# Patient Record
Sex: Male | Born: 1940 | Race: White | Hispanic: No | Marital: Married | Smoking: Never smoker
Health system: Southern US, Community
[De-identification: ages and names within clinical notes are randomized; demographics above are authoritative.]

## PROBLEM LIST (undated history)

## (undated) DIAGNOSIS — E079 Disorder of thyroid, unspecified: Secondary | ICD-10-CM

## (undated) DIAGNOSIS — N289 Disorder of kidney and ureter, unspecified: Secondary | ICD-10-CM

## (undated) DIAGNOSIS — E785 Hyperlipidemia, unspecified: Secondary | ICD-10-CM

## (undated) DIAGNOSIS — N400 Enlarged prostate without lower urinary tract symptoms: Secondary | ICD-10-CM

## (undated) DIAGNOSIS — M109 Gout, unspecified: Secondary | ICD-10-CM

## (undated) HISTORY — PX: CATARACT EXTRACTION: SUR2

---

## 2002-05-22 ENCOUNTER — Encounter: Admission: RE | Admit: 2002-05-22 | Discharge: 2002-05-22 | Payer: Self-pay | Admitting: Internal Medicine

## 2002-05-22 ENCOUNTER — Encounter: Payer: Self-pay | Admitting: Internal Medicine

## 2002-06-26 ENCOUNTER — Ambulatory Visit (HOSPITAL_COMMUNITY): Admission: RE | Admit: 2002-06-26 | Discharge: 2002-06-26 | Payer: Self-pay | Admitting: Gastroenterology

## 2011-05-13 DIAGNOSIS — E782 Mixed hyperlipidemia: Secondary | ICD-10-CM | POA: Diagnosis not present

## 2011-05-13 DIAGNOSIS — E039 Hypothyroidism, unspecified: Secondary | ICD-10-CM | POA: Diagnosis not present

## 2011-05-13 DIAGNOSIS — Z0000 Encounter for general adult medical examination without abnormal findings: Secondary | ICD-10-CM | POA: Diagnosis not present

## 2011-05-13 DIAGNOSIS — G4730 Sleep apnea, unspecified: Secondary | ICD-10-CM | POA: Diagnosis not present

## 2011-05-19 DIAGNOSIS — G4730 Sleep apnea, unspecified: Secondary | ICD-10-CM | POA: Diagnosis not present

## 2011-05-23 DIAGNOSIS — H2510 Age-related nuclear cataract, unspecified eye: Secondary | ICD-10-CM | POA: Diagnosis not present

## 2011-08-25 DIAGNOSIS — M79609 Pain in unspecified limb: Secondary | ICD-10-CM | POA: Diagnosis not present

## 2011-10-05 DIAGNOSIS — J330 Polyp of nasal cavity: Secondary | ICD-10-CM | POA: Diagnosis not present

## 2011-10-05 DIAGNOSIS — R1310 Dysphagia, unspecified: Secondary | ICD-10-CM | POA: Diagnosis not present

## 2011-10-17 ENCOUNTER — Other Ambulatory Visit: Payer: Self-pay | Admitting: Internal Medicine

## 2011-10-17 DIAGNOSIS — R1310 Dysphagia, unspecified: Secondary | ICD-10-CM | POA: Diagnosis not present

## 2011-10-17 DIAGNOSIS — R05 Cough: Secondary | ICD-10-CM | POA: Diagnosis not present

## 2011-10-18 ENCOUNTER — Ambulatory Visit
Admission: RE | Admit: 2011-10-18 | Discharge: 2011-10-18 | Disposition: A | Discharge status: Home / Self Care | Payer: Medicare Other | Source: Ambulatory Visit | Attending: Internal Medicine | Admitting: Internal Medicine

## 2011-10-18 DIAGNOSIS — R1310 Dysphagia, unspecified: Secondary | ICD-10-CM

## 2011-10-18 DIAGNOSIS — K219 Gastro-esophageal reflux disease without esophagitis: Secondary | ICD-10-CM | POA: Diagnosis not present

## 2011-11-18 DIAGNOSIS — E663 Overweight: Secondary | ICD-10-CM | POA: Diagnosis not present

## 2011-11-18 DIAGNOSIS — E039 Hypothyroidism, unspecified: Secondary | ICD-10-CM | POA: Diagnosis not present

## 2011-11-18 DIAGNOSIS — E782 Mixed hyperlipidemia: Secondary | ICD-10-CM | POA: Diagnosis not present

## 2011-11-18 DIAGNOSIS — G4730 Sleep apnea, unspecified: Secondary | ICD-10-CM | POA: Diagnosis not present

## 2012-01-19 DIAGNOSIS — Z23 Encounter for immunization: Secondary | ICD-10-CM | POA: Diagnosis not present

## 2012-05-15 DIAGNOSIS — Z0000 Encounter for general adult medical examination without abnormal findings: Secondary | ICD-10-CM | POA: Diagnosis not present

## 2012-05-15 DIAGNOSIS — G4730 Sleep apnea, unspecified: Secondary | ICD-10-CM | POA: Diagnosis not present

## 2012-05-15 DIAGNOSIS — E039 Hypothyroidism, unspecified: Secondary | ICD-10-CM | POA: Diagnosis not present

## 2012-06-01 DIAGNOSIS — G4730 Sleep apnea, unspecified: Secondary | ICD-10-CM | POA: Diagnosis not present

## 2012-06-01 DIAGNOSIS — E663 Overweight: Secondary | ICD-10-CM | POA: Diagnosis not present

## 2012-06-01 DIAGNOSIS — R7309 Other abnormal glucose: Secondary | ICD-10-CM | POA: Diagnosis not present

## 2012-06-01 DIAGNOSIS — Z125 Encounter for screening for malignant neoplasm of prostate: Secondary | ICD-10-CM | POA: Diagnosis not present

## 2012-06-13 DIAGNOSIS — G4733 Obstructive sleep apnea (adult) (pediatric): Secondary | ICD-10-CM | POA: Diagnosis not present

## 2012-06-27 DIAGNOSIS — N3941 Urge incontinence: Secondary | ICD-10-CM | POA: Diagnosis not present

## 2012-06-27 DIAGNOSIS — N402 Nodular prostate without lower urinary tract symptoms: Secondary | ICD-10-CM | POA: Diagnosis not present

## 2012-06-27 DIAGNOSIS — R972 Elevated prostate specific antigen [PSA]: Secondary | ICD-10-CM | POA: Diagnosis not present

## 2012-06-27 DIAGNOSIS — N529 Male erectile dysfunction, unspecified: Secondary | ICD-10-CM | POA: Diagnosis not present

## 2012-07-13 DIAGNOSIS — N401 Enlarged prostate with lower urinary tract symptoms: Secondary | ICD-10-CM | POA: Diagnosis not present

## 2012-07-13 DIAGNOSIS — N400 Enlarged prostate without lower urinary tract symptoms: Secondary | ICD-10-CM | POA: Diagnosis not present

## 2012-07-13 DIAGNOSIS — R972 Elevated prostate specific antigen [PSA]: Secondary | ICD-10-CM | POA: Diagnosis not present

## 2012-07-13 DIAGNOSIS — N139 Obstructive and reflux uropathy, unspecified: Secondary | ICD-10-CM | POA: Diagnosis not present

## 2012-08-29 DIAGNOSIS — H2510 Age-related nuclear cataract, unspecified eye: Secondary | ICD-10-CM | POA: Diagnosis not present

## 2012-11-20 DIAGNOSIS — G4730 Sleep apnea, unspecified: Secondary | ICD-10-CM | POA: Diagnosis not present

## 2012-11-20 DIAGNOSIS — E039 Hypothyroidism, unspecified: Secondary | ICD-10-CM | POA: Diagnosis not present

## 2012-11-20 DIAGNOSIS — E663 Overweight: Secondary | ICD-10-CM | POA: Diagnosis not present

## 2012-11-20 DIAGNOSIS — R972 Elevated prostate specific antigen [PSA]: Secondary | ICD-10-CM | POA: Diagnosis not present

## 2012-11-20 DIAGNOSIS — R7989 Other specified abnormal findings of blood chemistry: Secondary | ICD-10-CM | POA: Diagnosis not present

## 2012-12-06 DIAGNOSIS — D235 Other benign neoplasm of skin of trunk: Secondary | ICD-10-CM | POA: Diagnosis not present

## 2012-12-06 DIAGNOSIS — Z85820 Personal history of malignant melanoma of skin: Secondary | ICD-10-CM | POA: Diagnosis not present

## 2012-12-06 DIAGNOSIS — L570 Actinic keratosis: Secondary | ICD-10-CM | POA: Diagnosis not present

## 2012-12-06 DIAGNOSIS — D485 Neoplasm of uncertain behavior of skin: Secondary | ICD-10-CM | POA: Diagnosis not present

## 2012-12-24 DIAGNOSIS — Z23 Encounter for immunization: Secondary | ICD-10-CM | POA: Diagnosis not present

## 2013-01-15 DIAGNOSIS — N401 Enlarged prostate with lower urinary tract symptoms: Secondary | ICD-10-CM | POA: Diagnosis not present

## 2013-01-15 DIAGNOSIS — R972 Elevated prostate specific antigen [PSA]: Secondary | ICD-10-CM | POA: Diagnosis not present

## 2013-01-15 DIAGNOSIS — N139 Obstructive and reflux uropathy, unspecified: Secondary | ICD-10-CM | POA: Diagnosis not present

## 2013-01-15 DIAGNOSIS — N400 Enlarged prostate without lower urinary tract symptoms: Secondary | ICD-10-CM | POA: Diagnosis not present

## 2013-05-21 DIAGNOSIS — Z1331 Encounter for screening for depression: Secondary | ICD-10-CM | POA: Diagnosis not present

## 2013-05-21 DIAGNOSIS — Z6833 Body mass index (BMI) 33.0-33.9, adult: Secondary | ICD-10-CM | POA: Diagnosis not present

## 2013-05-21 DIAGNOSIS — Z0000 Encounter for general adult medical examination without abnormal findings: Secondary | ICD-10-CM | POA: Diagnosis not present

## 2013-05-21 DIAGNOSIS — E039 Hypothyroidism, unspecified: Secondary | ICD-10-CM | POA: Diagnosis not present

## 2013-05-21 DIAGNOSIS — G4730 Sleep apnea, unspecified: Secondary | ICD-10-CM | POA: Diagnosis not present

## 2013-05-21 DIAGNOSIS — E782 Mixed hyperlipidemia: Secondary | ICD-10-CM | POA: Diagnosis not present

## 2013-05-21 DIAGNOSIS — N529 Male erectile dysfunction, unspecified: Secondary | ICD-10-CM | POA: Diagnosis not present

## 2013-05-21 DIAGNOSIS — E663 Overweight: Secondary | ICD-10-CM | POA: Diagnosis not present

## 2013-07-16 DIAGNOSIS — R7309 Other abnormal glucose: Secondary | ICD-10-CM | POA: Diagnosis not present

## 2013-07-23 DIAGNOSIS — M25469 Effusion, unspecified knee: Secondary | ICD-10-CM | POA: Diagnosis not present

## 2013-08-26 DIAGNOSIS — H2510 Age-related nuclear cataract, unspecified eye: Secondary | ICD-10-CM | POA: Diagnosis not present

## 2013-11-19 DIAGNOSIS — E782 Mixed hyperlipidemia: Secondary | ICD-10-CM | POA: Diagnosis not present

## 2013-11-19 DIAGNOSIS — E039 Hypothyroidism, unspecified: Secondary | ICD-10-CM | POA: Diagnosis not present

## 2013-11-19 DIAGNOSIS — G4730 Sleep apnea, unspecified: Secondary | ICD-10-CM | POA: Diagnosis not present

## 2013-11-19 DIAGNOSIS — R972 Elevated prostate specific antigen [PSA]: Secondary | ICD-10-CM | POA: Diagnosis not present

## 2013-11-22 DIAGNOSIS — R7309 Other abnormal glucose: Secondary | ICD-10-CM | POA: Diagnosis not present

## 2013-12-23 DIAGNOSIS — Z23 Encounter for immunization: Secondary | ICD-10-CM | POA: Diagnosis not present

## 2014-01-28 DIAGNOSIS — R972 Elevated prostate specific antigen [PSA]: Secondary | ICD-10-CM | POA: Diagnosis not present

## 2014-01-28 DIAGNOSIS — N401 Enlarged prostate with lower urinary tract symptoms: Secondary | ICD-10-CM | POA: Diagnosis not present

## 2014-01-28 DIAGNOSIS — N400 Enlarged prostate without lower urinary tract symptoms: Secondary | ICD-10-CM | POA: Diagnosis not present

## 2014-04-03 DIAGNOSIS — Z85820 Personal history of malignant melanoma of skin: Secondary | ICD-10-CM | POA: Diagnosis not present

## 2014-04-03 DIAGNOSIS — D1801 Hemangioma of skin and subcutaneous tissue: Secondary | ICD-10-CM | POA: Diagnosis not present

## 2014-04-03 DIAGNOSIS — Z08 Encounter for follow-up examination after completed treatment for malignant neoplasm: Secondary | ICD-10-CM | POA: Diagnosis not present

## 2014-04-03 DIAGNOSIS — L820 Inflamed seborrheic keratosis: Secondary | ICD-10-CM | POA: Diagnosis not present

## 2014-04-03 DIAGNOSIS — D225 Melanocytic nevi of trunk: Secondary | ICD-10-CM | POA: Diagnosis not present

## 2014-05-27 DIAGNOSIS — E782 Mixed hyperlipidemia: Secondary | ICD-10-CM | POA: Diagnosis not present

## 2014-05-27 DIAGNOSIS — Z6832 Body mass index (BMI) 32.0-32.9, adult: Secondary | ICD-10-CM | POA: Diagnosis not present

## 2014-05-27 DIAGNOSIS — G4730 Sleep apnea, unspecified: Secondary | ICD-10-CM | POA: Diagnosis not present

## 2014-05-27 DIAGNOSIS — E663 Overweight: Secondary | ICD-10-CM | POA: Diagnosis not present

## 2014-05-27 DIAGNOSIS — M109 Gout, unspecified: Secondary | ICD-10-CM | POA: Diagnosis not present

## 2014-05-27 DIAGNOSIS — E039 Hypothyroidism, unspecified: Secondary | ICD-10-CM | POA: Diagnosis not present

## 2014-05-27 DIAGNOSIS — Z1389 Encounter for screening for other disorder: Secondary | ICD-10-CM | POA: Diagnosis not present

## 2014-05-27 DIAGNOSIS — Z0000 Encounter for general adult medical examination without abnormal findings: Secondary | ICD-10-CM | POA: Diagnosis not present

## 2014-05-27 DIAGNOSIS — R7309 Other abnormal glucose: Secondary | ICD-10-CM | POA: Diagnosis not present

## 2014-05-27 DIAGNOSIS — Z23 Encounter for immunization: Secondary | ICD-10-CM | POA: Diagnosis not present

## 2014-05-28 ENCOUNTER — Ambulatory Visit (INDEPENDENT_AMBULATORY_CARE_PROVIDER_SITE_OTHER): Payer: Medicare Other | Admitting: Podiatry

## 2014-05-28 ENCOUNTER — Ambulatory Visit: Payer: Medicare Other

## 2014-05-28 VITALS — BP 165/89 | HR 64 | Resp 16

## 2014-05-28 DIAGNOSIS — M779 Enthesopathy, unspecified: Secondary | ICD-10-CM

## 2014-05-28 DIAGNOSIS — M25572 Pain in left ankle and joints of left foot: Secondary | ICD-10-CM | POA: Diagnosis not present

## 2014-05-28 DIAGNOSIS — M79671 Pain in right foot: Secondary | ICD-10-CM | POA: Diagnosis not present

## 2014-05-28 DIAGNOSIS — S93409A Sprain of unspecified ligament of unspecified ankle, initial encounter: Secondary | ICD-10-CM | POA: Diagnosis not present

## 2014-05-28 MED ORDER — ALLOPURINOL 100 MG PO TABS: 100.0000 mg | ORAL_TABLET | Freq: Every day | ORAL | Status: AC

## 2014-05-28 MED ORDER — ALLOPURINOL 100 MG PO TABS: 100.0000 mg | ORAL_TABLET | Freq: Every day | ORAL | Status: DC

## 2014-05-28 MED ORDER — TRIAMCINOLONE ACETONIDE 10 MG/ML IJ SUSP
10.0000 mg | Freq: Once | INTRAMUSCULAR | Status: AC
  Administered 2014-05-28: 10 mg

## 2014-05-28 NOTE — Progress Notes (Signed)
   Subjective:    Patient ID: Nicholas Hudson, male    DOB: Jun 10, 1940, 74 y.o.   MRN: [id number]  HPI Pt presents with bilateral ankle pain and swelling ongoing for 1 year and worsening, has h/o of gout with pain on dorsal side of both feet   Review of Systems  All other systems reviewed and are negative.      Objective:   Physical Exam        Assessment & Plan:

## 2014-05-29 DIAGNOSIS — R7309 Other abnormal glucose: Secondary | ICD-10-CM | POA: Diagnosis not present

## 2014-05-29 DIAGNOSIS — Z0000 Encounter for general adult medical examination without abnormal findings: Secondary | ICD-10-CM | POA: Diagnosis not present

## 2014-05-29 NOTE — Progress Notes (Signed)
Subjective:     Patient ID: Nicholas Hudson, male   DOB: 1941-01-29, 74 y.o.   MRN: [id number]  HPI patient presents stating my left ankle hurts over my right ankle and this is been going on for a fairly long time. Does not remember specific injury that occurred or severe sprain of an ankle   Review of Systems  All other systems reviewed and are negative.      Objective:   Physical Exam  Constitutional: He is oriented to person, place, and time.  Cardiovascular: Intact distal pulses.   Musculoskeletal: Normal range of motion.  Neurological: He is oriented to person, place, and time.  Skin: Skin is warm.  Nursing note and vitals reviewed.  neurovascular status found to be intact with moderate edema in the ankle region bilateral +1 pitting with negative Homans sign noted and quite a bit of discomfort in the sinus tarsi left and into the lateral ankle gutter with moderate discomfort right not to the severe range     Assessment:     Inflammatory capsulitis possible of sinus tarsi left with fluid buildup and no indications of vein disease at this time    Plan:     H&P and x-rays reviewed. Injected the sinus tarsi left 3 mg Kenalog 5 mg Xylocaine and advised on compression treatment. Reappoint for [place] to recheck again in 2 weeks to review results

## 2014-06-05 DIAGNOSIS — I868 Varicose veins of other specified sites: Secondary | ICD-10-CM | POA: Diagnosis not present

## 2014-07-23 ENCOUNTER — Ambulatory Visit (INDEPENDENT_AMBULATORY_CARE_PROVIDER_SITE_OTHER): Payer: Medicare Other | Admitting: Podiatry

## 2014-07-23 VITALS — BP 148/80 | HR 67 | Resp 15

## 2014-07-23 DIAGNOSIS — M779 Enthesopathy, unspecified: Secondary | ICD-10-CM | POA: Diagnosis not present

## 2014-07-25 NOTE — Progress Notes (Signed)
Subjective:     Patient ID: Nicholas Hudson, male   DOB: 1940/12/15, 74 y.o.   MRN: [id number]  HPI patient states that it's feeling quite a bit better in my left ankle but still sore if I been on it a lot   Review of Systems     Objective:   Physical Exam Neurovascular status unchanged with no change in health history and noted to have diminishment of discomfort in the left sinus tarsi and lateral ankle gutter with improved inversion eversion range of motion    Assessment:     Improving inflammatory capsulitis left sinus tarsi and lateral ankle gutter    Plan:     Reviewed condition and at this point recommended heat and ice and stretching exercises. Reappoint if symptoms were to reoccur

## 2014-07-31 DIAGNOSIS — J331 Polypoid sinus degeneration: Secondary | ICD-10-CM | POA: Diagnosis not present

## 2014-08-26 DIAGNOSIS — H2513 Age-related nuclear cataract, bilateral: Secondary | ICD-10-CM | POA: Diagnosis not present

## 2014-11-18 DIAGNOSIS — L820 Inflamed seborrheic keratosis: Secondary | ICD-10-CM | POA: Diagnosis not present

## 2014-11-18 DIAGNOSIS — D485 Neoplasm of uncertain behavior of skin: Secondary | ICD-10-CM | POA: Diagnosis not present

## 2014-11-25 DIAGNOSIS — E039 Hypothyroidism, unspecified: Secondary | ICD-10-CM | POA: Diagnosis not present

## 2014-11-25 DIAGNOSIS — E782 Mixed hyperlipidemia: Secondary | ICD-10-CM | POA: Diagnosis not present

## 2014-11-25 DIAGNOSIS — R600 Localized edema: Secondary | ICD-10-CM | POA: Diagnosis not present

## 2014-11-25 DIAGNOSIS — E663 Overweight: Secondary | ICD-10-CM | POA: Diagnosis not present

## 2014-11-25 DIAGNOSIS — G4730 Sleep apnea, unspecified: Secondary | ICD-10-CM | POA: Diagnosis not present

## 2014-11-25 DIAGNOSIS — Z6832 Body mass index (BMI) 32.0-32.9, adult: Secondary | ICD-10-CM | POA: Diagnosis not present

## 2014-12-10 DIAGNOSIS — Z23 Encounter for immunization: Secondary | ICD-10-CM | POA: Diagnosis not present

## 2015-01-27 DIAGNOSIS — N138 Other obstructive and reflux uropathy: Secondary | ICD-10-CM | POA: Diagnosis not present

## 2015-01-27 DIAGNOSIS — N401 Enlarged prostate with lower urinary tract symptoms: Secondary | ICD-10-CM | POA: Diagnosis not present

## 2015-06-02 DIAGNOSIS — Z0000 Encounter for general adult medical examination without abnormal findings: Secondary | ICD-10-CM | POA: Diagnosis not present

## 2015-06-02 DIAGNOSIS — M109 Gout, unspecified: Secondary | ICD-10-CM | POA: Diagnosis not present

## 2015-06-02 DIAGNOSIS — G4730 Sleep apnea, unspecified: Secondary | ICD-10-CM | POA: Diagnosis not present

## 2015-06-02 DIAGNOSIS — E663 Overweight: Secondary | ICD-10-CM | POA: Diagnosis not present

## 2015-06-02 DIAGNOSIS — Z1389 Encounter for screening for other disorder: Secondary | ICD-10-CM | POA: Diagnosis not present

## 2015-06-02 DIAGNOSIS — Z6833 Body mass index (BMI) 33.0-33.9, adult: Secondary | ICD-10-CM | POA: Diagnosis not present

## 2015-06-02 DIAGNOSIS — R7301 Impaired fasting glucose: Secondary | ICD-10-CM | POA: Diagnosis not present

## 2015-06-02 DIAGNOSIS — E039 Hypothyroidism, unspecified: Secondary | ICD-10-CM | POA: Diagnosis not present

## 2015-06-02 DIAGNOSIS — R7309 Other abnormal glucose: Secondary | ICD-10-CM | POA: Diagnosis not present

## 2015-06-02 DIAGNOSIS — E7800 Pure hypercholesterolemia, unspecified: Secondary | ICD-10-CM | POA: Diagnosis not present

## 2015-10-13 DIAGNOSIS — D225 Melanocytic nevi of trunk: Secondary | ICD-10-CM | POA: Diagnosis not present

## 2015-10-13 DIAGNOSIS — Z85820 Personal history of malignant melanoma of skin: Secondary | ICD-10-CM | POA: Diagnosis not present

## 2015-10-13 DIAGNOSIS — L814 Other melanin hyperpigmentation: Secondary | ICD-10-CM | POA: Diagnosis not present

## 2015-10-13 DIAGNOSIS — L821 Other seborrheic keratosis: Secondary | ICD-10-CM | POA: Diagnosis not present

## 2015-10-13 DIAGNOSIS — L820 Inflamed seborrheic keratosis: Secondary | ICD-10-CM | POA: Diagnosis not present

## 2015-10-13 DIAGNOSIS — L219 Seborrheic dermatitis, unspecified: Secondary | ICD-10-CM | POA: Diagnosis not present

## 2015-11-19 ENCOUNTER — Other Ambulatory Visit: Payer: Self-pay

## 2015-12-28 DIAGNOSIS — Z23 Encounter for immunization: Secondary | ICD-10-CM | POA: Diagnosis not present

## 2016-01-26 DIAGNOSIS — R972 Elevated prostate specific antigen [PSA]: Secondary | ICD-10-CM | POA: Diagnosis not present

## 2016-01-26 DIAGNOSIS — N401 Enlarged prostate with lower urinary tract symptoms: Secondary | ICD-10-CM | POA: Diagnosis not present

## 2016-01-26 DIAGNOSIS — N400 Enlarged prostate without lower urinary tract symptoms: Secondary | ICD-10-CM | POA: Diagnosis not present

## 2016-01-26 DIAGNOSIS — N529 Male erectile dysfunction, unspecified: Secondary | ICD-10-CM | POA: Diagnosis not present

## 2016-01-26 DIAGNOSIS — N138 Other obstructive and reflux uropathy: Secondary | ICD-10-CM | POA: Diagnosis not present

## 2016-03-10 ENCOUNTER — Other Ambulatory Visit: Payer: Self-pay | Admitting: Otolaryngology

## 2016-03-10 DIAGNOSIS — J331 Polypoid sinus degeneration: Secondary | ICD-10-CM | POA: Diagnosis not present

## 2016-03-10 DIAGNOSIS — R519 Headache, unspecified: Secondary | ICD-10-CM

## 2016-03-10 DIAGNOSIS — R51 Headache: Secondary | ICD-10-CM

## 2016-03-10 DIAGNOSIS — R0981 Nasal congestion: Secondary | ICD-10-CM

## 2016-03-10 DIAGNOSIS — G4733 Obstructive sleep apnea (adult) (pediatric): Secondary | ICD-10-CM | POA: Diagnosis not present

## 2016-03-23 ENCOUNTER — Ambulatory Visit
Admission: RE | Admit: 2016-03-23 | Discharge: 2016-03-23 | Disposition: A | Discharge status: Home / Self Care | Payer: Medicare Other | Source: Ambulatory Visit | Attending: Otolaryngology | Admitting: Otolaryngology

## 2016-03-23 DIAGNOSIS — R519 Headache, unspecified: Secondary | ICD-10-CM

## 2016-03-23 DIAGNOSIS — R51 Headache: Secondary | ICD-10-CM | POA: Diagnosis not present

## 2016-03-23 DIAGNOSIS — R0981 Nasal congestion: Secondary | ICD-10-CM

## 2016-03-31 DIAGNOSIS — J329 Chronic sinusitis, unspecified: Secondary | ICD-10-CM | POA: Diagnosis not present

## 2016-03-31 DIAGNOSIS — J331 Polypoid sinus degeneration: Secondary | ICD-10-CM | POA: Diagnosis not present

## 2016-04-14 DIAGNOSIS — E039 Hypothyroidism, unspecified: Secondary | ICD-10-CM | POA: Diagnosis not present

## 2016-04-14 DIAGNOSIS — N529 Male erectile dysfunction, unspecified: Secondary | ICD-10-CM | POA: Diagnosis not present

## 2016-04-14 DIAGNOSIS — R739 Hyperglycemia, unspecified: Secondary | ICD-10-CM | POA: Diagnosis not present

## 2016-04-14 DIAGNOSIS — G5793 Unspecified mononeuropathy of bilateral lower limbs: Secondary | ICD-10-CM | POA: Diagnosis not present

## 2016-04-14 DIAGNOSIS — R269 Unspecified abnormalities of gait and mobility: Secondary | ICD-10-CM | POA: Diagnosis not present

## 2016-04-14 DIAGNOSIS — R7301 Impaired fasting glucose: Secondary | ICD-10-CM | POA: Diagnosis not present

## 2016-04-14 DIAGNOSIS — R5382 Chronic fatigue, unspecified: Secondary | ICD-10-CM | POA: Diagnosis not present

## 2016-04-16 ENCOUNTER — Encounter (HOSPITAL_COMMUNITY): Payer: Self-pay | Admitting: Emergency Medicine

## 2016-04-16 ENCOUNTER — Inpatient Hospital Stay (HOSPITAL_COMMUNITY)
Admission: EM | Admit: 2016-04-16 | Discharge: 2016-05-13 | DRG: 673 | Disposition: A | Discharge status: Home / Self Care | Payer: Medicare Other | Attending: Nephrology | Admitting: Nephrology

## 2016-04-16 ENCOUNTER — Emergency Department (HOSPITAL_COMMUNITY): Payer: Medicare Other

## 2016-04-16 DIAGNOSIS — E8770 Fluid overload, unspecified: Secondary | ICD-10-CM | POA: Diagnosis not present

## 2016-04-16 DIAGNOSIS — E039 Hypothyroidism, unspecified: Secondary | ICD-10-CM | POA: Diagnosis not present

## 2016-04-16 DIAGNOSIS — N017 Rapidly progressive nephritic syndrome with diffuse crescentic glomerulonephritis: Secondary | ICD-10-CM | POA: Diagnosis present

## 2016-04-16 DIAGNOSIS — I10 Essential (primary) hypertension: Secondary | ICD-10-CM | POA: Diagnosis not present

## 2016-04-16 DIAGNOSIS — A419 Sepsis, unspecified organism: Secondary | ICD-10-CM | POA: Diagnosis not present

## 2016-04-16 DIAGNOSIS — E871 Hypo-osmolality and hyponatremia: Secondary | ICD-10-CM

## 2016-04-16 DIAGNOSIS — K828 Other specified diseases of gallbladder: Secondary | ICD-10-CM | POA: Diagnosis not present

## 2016-04-16 DIAGNOSIS — J9811 Atelectasis: Secondary | ICD-10-CM | POA: Diagnosis not present

## 2016-04-16 DIAGNOSIS — M109 Gout, unspecified: Secondary | ICD-10-CM | POA: Diagnosis not present

## 2016-04-16 DIAGNOSIS — I7789 Other specified disorders of arteries and arterioles: Secondary | ICD-10-CM | POA: Diagnosis not present

## 2016-04-16 DIAGNOSIS — E870 Hyperosmolality and hypernatremia: Secondary | ICD-10-CM | POA: Diagnosis not present

## 2016-04-16 DIAGNOSIS — R935 Abnormal findings on diagnostic imaging of other abdominal regions, including retroperitoneum: Secondary | ICD-10-CM | POA: Diagnosis not present

## 2016-04-16 DIAGNOSIS — N183 Chronic kidney disease, stage 3 unspecified: Secondary | ICD-10-CM

## 2016-04-16 DIAGNOSIS — K635 Polyp of colon: Secondary | ICD-10-CM | POA: Diagnosis present

## 2016-04-16 DIAGNOSIS — Z7982 Long term (current) use of aspirin: Secondary | ICD-10-CM

## 2016-04-16 DIAGNOSIS — K269 Duodenal ulcer, unspecified as acute or chronic, without hemorrhage or perforation: Secondary | ICD-10-CM | POA: Diagnosis not present

## 2016-04-16 DIAGNOSIS — E860 Dehydration: Secondary | ICD-10-CM | POA: Diagnosis present

## 2016-04-16 DIAGNOSIS — I509 Heart failure, unspecified: Secondary | ICD-10-CM | POA: Diagnosis not present

## 2016-04-16 DIAGNOSIS — Z6833 Body mass index (BMI) 33.0-33.9, adult: Secondary | ICD-10-CM

## 2016-04-16 DIAGNOSIS — R739 Hyperglycemia, unspecified: Secondary | ICD-10-CM | POA: Diagnosis not present

## 2016-04-16 DIAGNOSIS — G9341 Metabolic encephalopathy: Secondary | ICD-10-CM

## 2016-04-16 DIAGNOSIS — N009 Acute nephritic syndrome with unspecified morphologic changes: Secondary | ICD-10-CM | POA: Diagnosis present

## 2016-04-16 DIAGNOSIS — K260 Acute duodenal ulcer with hemorrhage: Secondary | ICD-10-CM | POA: Diagnosis not present

## 2016-04-16 DIAGNOSIS — N179 Acute kidney failure, unspecified: Secondary | ICD-10-CM | POA: Diagnosis not present

## 2016-04-16 DIAGNOSIS — D62 Acute posthemorrhagic anemia: Secondary | ICD-10-CM

## 2016-04-16 DIAGNOSIS — R079 Chest pain, unspecified: Secondary | ICD-10-CM | POA: Diagnosis not present

## 2016-04-16 DIAGNOSIS — E861 Hypovolemia: Secondary | ICD-10-CM | POA: Diagnosis present

## 2016-04-16 DIAGNOSIS — K644 Residual hemorrhoidal skin tags: Secondary | ICD-10-CM | POA: Diagnosis present

## 2016-04-16 DIAGNOSIS — K250 Acute gastric ulcer with hemorrhage: Secondary | ICD-10-CM | POA: Diagnosis not present

## 2016-04-16 DIAGNOSIS — T380X5A Adverse effect of glucocorticoids and synthetic analogues, initial encounter: Secondary | ICD-10-CM | POA: Diagnosis present

## 2016-04-16 DIAGNOSIS — E875 Hyperkalemia: Secondary | ICD-10-CM | POA: Diagnosis not present

## 2016-04-16 DIAGNOSIS — E669 Obesity, unspecified: Secondary | ICD-10-CM | POA: Diagnosis present

## 2016-04-16 DIAGNOSIS — K921 Melena: Secondary | ICD-10-CM | POA: Diagnosis not present

## 2016-04-16 DIAGNOSIS — D696 Thrombocytopenia, unspecified: Secondary | ICD-10-CM | POA: Diagnosis present

## 2016-04-16 DIAGNOSIS — R748 Abnormal levels of other serum enzymes: Secondary | ICD-10-CM | POA: Diagnosis not present

## 2016-04-16 DIAGNOSIS — E785 Hyperlipidemia, unspecified: Secondary | ICD-10-CM | POA: Diagnosis present

## 2016-04-16 DIAGNOSIS — K649 Unspecified hemorrhoids: Secondary | ICD-10-CM | POA: Diagnosis not present

## 2016-04-16 DIAGNOSIS — T451X5A Adverse effect of antineoplastic and immunosuppressive drugs, initial encounter: Secondary | ICD-10-CM | POA: Diagnosis not present

## 2016-04-16 DIAGNOSIS — Z23 Encounter for immunization: Secondary | ICD-10-CM

## 2016-04-16 DIAGNOSIS — K5730 Diverticulosis of large intestine without perforation or abscess without bleeding: Secondary | ICD-10-CM | POA: Diagnosis present

## 2016-04-16 DIAGNOSIS — R509 Fever, unspecified: Secondary | ICD-10-CM | POA: Diagnosis not present

## 2016-04-16 DIAGNOSIS — R262 Difficulty in walking, not elsewhere classified: Secondary | ICD-10-CM

## 2016-04-16 DIAGNOSIS — N289 Disorder of kidney and ureter, unspecified: Secondary | ICD-10-CM | POA: Diagnosis not present

## 2016-04-16 DIAGNOSIS — R0602 Shortness of breath: Secondary | ICD-10-CM | POA: Diagnosis not present

## 2016-04-16 DIAGNOSIS — R05 Cough: Secondary | ICD-10-CM

## 2016-04-16 DIAGNOSIS — R51 Headache: Secondary | ICD-10-CM

## 2016-04-16 DIAGNOSIS — N059 Unspecified nephritic syndrome with unspecified morphologic changes: Secondary | ICD-10-CM | POA: Diagnosis not present

## 2016-04-16 DIAGNOSIS — R059 Cough, unspecified: Secondary | ICD-10-CM

## 2016-04-16 DIAGNOSIS — R519 Headache, unspecified: Secondary | ICD-10-CM

## 2016-04-16 DIAGNOSIS — R030 Elevated blood-pressure reading, without diagnosis of hypertension: Secondary | ICD-10-CM | POA: Diagnosis not present

## 2016-04-16 DIAGNOSIS — J329 Chronic sinusitis, unspecified: Secondary | ICD-10-CM | POA: Diagnosis present

## 2016-04-16 DIAGNOSIS — K8689 Other specified diseases of pancreas: Secondary | ICD-10-CM

## 2016-04-16 DIAGNOSIS — R740 Nonspecific elevation of levels of transaminase and lactic acid dehydrogenase [LDH]: Secondary | ICD-10-CM | POA: Diagnosis present

## 2016-04-16 DIAGNOSIS — N19 Unspecified kidney failure: Secondary | ICD-10-CM | POA: Diagnosis not present

## 2016-04-16 DIAGNOSIS — N400 Enlarged prostate without lower urinary tract symptoms: Secondary | ICD-10-CM | POA: Diagnosis present

## 2016-04-16 DIAGNOSIS — D500 Iron deficiency anemia secondary to blood loss (chronic): Secondary | ICD-10-CM | POA: Diagnosis not present

## 2016-04-16 DIAGNOSIS — K264 Chronic or unspecified duodenal ulcer with hemorrhage: Secondary | ICD-10-CM | POA: Diagnosis not present

## 2016-04-16 DIAGNOSIS — D649 Anemia, unspecified: Secondary | ICD-10-CM | POA: Diagnosis not present

## 2016-04-16 DIAGNOSIS — I776 Arteritis, unspecified: Secondary | ICD-10-CM | POA: Diagnosis not present

## 2016-04-16 DIAGNOSIS — G629 Polyneuropathy, unspecified: Secondary | ICD-10-CM | POA: Diagnosis present

## 2016-04-16 DIAGNOSIS — Z4901 Encounter for fitting and adjustment of extracorporeal dialysis catheter: Secondary | ICD-10-CM | POA: Diagnosis not present

## 2016-04-16 DIAGNOSIS — N170 Acute kidney failure with tubular necrosis: Secondary | ICD-10-CM | POA: Diagnosis not present

## 2016-04-16 DIAGNOSIS — D123 Benign neoplasm of transverse colon: Secondary | ICD-10-CM | POA: Diagnosis not present

## 2016-04-16 DIAGNOSIS — I7782 Antineutrophilic cytoplasmic antibody (ANCA) vasculitis: Secondary | ICD-10-CM

## 2016-04-16 DIAGNOSIS — K648 Other hemorrhoids: Secondary | ICD-10-CM | POA: Diagnosis present

## 2016-04-16 HISTORY — DX: Benign prostatic hyperplasia without lower urinary tract symptoms: N40.0

## 2016-04-16 HISTORY — DX: Gout, unspecified: M10.9

## 2016-04-16 HISTORY — DX: Hyperlipidemia, unspecified: E78.5

## 2016-04-16 HISTORY — DX: Disorder of thyroid, unspecified: E07.9

## 2016-04-16 HISTORY — DX: Disorder of kidney and ureter, unspecified: N28.9

## 2016-04-16 LAB — URINALYSIS, ROUTINE W REFLEX MICROSCOPIC
Bilirubin Urine: NEGATIVE
Glucose, UA: NEGATIVE mg/dL
KETONES UR: NEGATIVE mg/dL
Leukocytes, UA: NEGATIVE
Nitrite: NEGATIVE
PROTEIN: 30 mg/dL — AB
Specific Gravity, Urine: 1.004 — ABNORMAL LOW (ref 1.005–1.030)
pH: 5 (ref 5.0–8.0)

## 2016-04-16 LAB — COMPREHENSIVE METABOLIC PANEL
ALBUMIN: 2.7 g/dL — AB (ref 3.5–5.0)
ALK PHOS: 378 U/L — AB (ref 38–126)
ALT: 30 U/L (ref 17–63)
AST: 41 U/L (ref 15–41)
Anion gap: 13 (ref 5–15)
BUN: 43 mg/dL — ABNORMAL HIGH (ref 6–20)
CALCIUM: 7.9 mg/dL — AB (ref 8.9–10.3)
CHLORIDE: 93 mmol/L — AB (ref 101–111)
CO2: 20 mmol/L — AB (ref 22–32)
CREATININE: 4.46 mg/dL — AB (ref 0.61–1.24)
GFR calc Af Amer: 13 mL/min — ABNORMAL LOW (ref >60)
GFR calc non Af Amer: 12 mL/min — ABNORMAL LOW (ref >60)
GLUCOSE: 111 mg/dL — AB (ref 65–99)
Potassium: 5.7 mmol/L — ABNORMAL HIGH (ref 3.5–5.1)
SODIUM: 126 mmol/L — AB (ref 135–145)
Total Bilirubin: 1.2 mg/dL (ref 0.3–1.2)
Total Protein: 6.6 g/dL (ref 6.5–8.1)

## 2016-04-16 LAB — BASIC METABOLIC PANEL
Anion gap: 11 (ref 5–15)
BUN: 44 mg/dL — AB (ref 6–20)
CHLORIDE: 95 mmol/L — AB (ref 101–111)
CO2: 22 mmol/L (ref 22–32)
CREATININE: 4.84 mg/dL — AB (ref 0.61–1.24)
Calcium: 7.7 mg/dL — ABNORMAL LOW (ref 8.9–10.3)
GFR calc Af Amer: 12 mL/min — ABNORMAL LOW (ref >60)
GFR calc non Af Amer: 11 mL/min — ABNORMAL LOW (ref >60)
Glucose, Bld: 127 mg/dL — ABNORMAL HIGH (ref 65–99)
Potassium: 5 mmol/L (ref 3.5–5.1)
Sodium: 128 mmol/L — ABNORMAL LOW (ref 135–145)

## 2016-04-16 LAB — CBC WITH DIFFERENTIAL/PLATELET
Basophils Absolute: 0 10*3/uL (ref 0.0–0.1)
Basophils Relative: 1 %
EOS ABS: 0.2 10*3/uL (ref 0.0–0.7)
EOS PCT: 2 %
HCT: 40.1 % (ref 39.0–52.0)
HEMOGLOBIN: 13.6 g/dL (ref 13.0–17.0)
LYMPHS ABS: 1 10*3/uL (ref 0.7–4.0)
LYMPHS PCT: 11 %
MCH: 28.9 pg (ref 26.0–34.0)
MCHC: 33.9 g/dL (ref 30.0–36.0)
MCV: 85.1 fL (ref 78.0–100.0)
MONOS PCT: 7 %
Monocytes Absolute: 0.6 10*3/uL (ref 0.1–1.0)
NEUTROS PCT: 79 %
Neutro Abs: 6.6 10*3/uL (ref 1.7–7.7)
Platelets: 194 10*3/uL (ref 150–400)
RBC: 4.71 MIL/uL (ref 4.22–5.81)
RDW: 13 % (ref 11.5–15.5)
WBC: 8.4 10*3/uL (ref 4.0–10.5)

## 2016-04-16 LAB — I-STAT CG4 LACTIC ACID, ED: Lactic Acid, Venous: 1.47 mmol/L (ref 0.5–1.9)

## 2016-04-16 LAB — INFLUENZA PANEL BY PCR (TYPE A & B)
INFLAPCR: NEGATIVE
INFLBPCR: NEGATIVE

## 2016-04-16 LAB — PROTIME-INR
INR: 1.21
PROTHROMBIN TIME: 15.4 s — AB (ref 11.4–15.2)

## 2016-04-16 LAB — TSH: TSH: 5.396 u[IU]/mL — ABNORMAL HIGH (ref 0.350–4.500)

## 2016-04-16 LAB — CK: CK TOTAL: 92 U/L (ref 49–397)

## 2016-04-16 LAB — AMMONIA: Ammonia: 11 umol/L (ref 9–35)

## 2016-04-16 MED ORDER — SODIUM POLYSTYRENE SULFONATE 15 GM/60ML PO SUSP
30.0000 g | Freq: Once | ORAL | Status: AC
  Administered 2016-04-16: 30 g via ORAL
  Filled 2016-04-16: qty 120

## 2016-04-16 MED ORDER — ROSUVASTATIN CALCIUM 20 MG PO TABS
20.0000 mg | ORAL_TABLET | Freq: Every day | ORAL | Status: DC
  Administered 2016-04-16 – 2016-04-18 (×3): 20 mg via ORAL
  Filled 2016-04-16 (×3): qty 1

## 2016-04-16 MED ORDER — SENNA 8.6 MG PO TABS
1.0000 | ORAL_TABLET | Freq: Two times a day (BID) | ORAL | Status: DC
  Administered 2016-04-16 – 2016-04-28 (×20): 8.6 mg via ORAL
  Filled 2016-04-16 (×24): qty 1

## 2016-04-16 MED ORDER — TRAMADOL HCL 50 MG PO TABS
50.0000 mg | ORAL_TABLET | Freq: Four times a day (QID) | ORAL | Status: DC | PRN
  Administered 2016-04-16 – 2016-04-26 (×3): 50 mg via ORAL
  Filled 2016-04-16 (×3): qty 1

## 2016-04-16 MED ORDER — SODIUM CHLORIDE 0.9 % IV SOLN
Freq: Once | INTRAVENOUS | Status: AC
  Administered 2016-04-16: 100 mL/h via INTRAVENOUS

## 2016-04-16 MED ORDER — FINASTERIDE 5 MG PO TABS
5.0000 mg | ORAL_TABLET | Freq: Every day | ORAL | Status: DC
  Administered 2016-04-16 – 2016-04-29 (×14): 5 mg via ORAL
  Filled 2016-04-16 (×14): qty 1

## 2016-04-16 MED ORDER — GABAPENTIN 300 MG PO CAPS
300.0000 mg | ORAL_CAPSULE | Freq: Every day | ORAL | Status: DC
  Administered 2016-04-16 – 2016-04-17 (×2): 300 mg via ORAL
  Filled 2016-04-16 (×2): qty 1

## 2016-04-16 MED ORDER — LEVOTHYROXINE SODIUM 200 MCG PO TABS
200.0000 ug | ORAL_TABLET | Freq: Every day | ORAL | Status: DC
  Administered 2016-04-17 – 2016-04-29 (×11): 200 ug via ORAL
  Filled 2016-04-16 (×11): qty 2
  Filled 2016-04-16: qty 1
  Filled 2016-04-16: qty 2

## 2016-04-16 MED ORDER — ONDANSETRON HCL 4 MG/2ML IJ SOLN
4.0000 mg | Freq: Four times a day (QID) | INTRAMUSCULAR | Status: DC | PRN
  Administered 2016-04-16 – 2016-04-29 (×5): 4 mg via INTRAVENOUS
  Filled 2016-04-16 (×4): qty 2

## 2016-04-16 MED ORDER — ACETAMINOPHEN 650 MG RE SUPP: 650.0000 mg | Freq: Four times a day (QID) | RECTAL | Status: DC | PRN

## 2016-04-16 MED ORDER — PNEUMOCOCCAL VAC POLYVALENT 25 MCG/0.5ML IJ INJ
0.5000 mL | INJECTION | INTRAMUSCULAR | Status: AC
  Administered 2016-04-19: 0.5 mL via INTRAMUSCULAR
  Filled 2016-04-16 (×2): qty 0.5

## 2016-04-16 MED ORDER — ZOLPIDEM TARTRATE 5 MG PO TABS
5.0000 mg | ORAL_TABLET | Freq: Every evening | ORAL | Status: DC | PRN
  Administered 2016-04-16 – 2016-05-11 (×20): 5 mg via ORAL
  Filled 2016-04-16 (×20): qty 1

## 2016-04-16 MED ORDER — ACETAMINOPHEN 325 MG PO TABS
650.0000 mg | ORAL_TABLET | Freq: Four times a day (QID) | ORAL | Status: DC | PRN
  Administered 2016-04-18: 650 mg via ORAL
  Filled 2016-04-16: qty 2

## 2016-04-16 MED ORDER — SODIUM CHLORIDE 0.9 % IV BOLUS (SEPSIS)
500.0000 mL | Freq: Once | INTRAVENOUS | Status: AC
  Administered 2016-04-16: 500 mL via INTRAVENOUS

## 2016-04-16 MED ORDER — SODIUM CHLORIDE 0.9 % IV SOLN
INTRAVENOUS | Status: DC
  Administered 2016-04-16 – 2016-04-17 (×2): via INTRAVENOUS

## 2016-04-16 MED ORDER — HEPARIN SODIUM (PORCINE) 5000 UNIT/ML IJ SOLN
5000.0000 [IU] | Freq: Three times a day (TID) | INTRAMUSCULAR | Status: DC
  Administered 2016-04-16 – 2016-04-20 (×9): 5000 [IU] via SUBCUTANEOUS
  Filled 2016-04-16 (×8): qty 1

## 2016-04-16 MED ORDER — ONDANSETRON HCL 4 MG PO TABS
4.0000 mg | ORAL_TABLET | Freq: Four times a day (QID) | ORAL | Status: DC | PRN
  Administered 2016-04-21 – 2016-04-26 (×2): 4 mg via ORAL
  Filled 2016-04-16 (×2): qty 1

## 2016-04-16 MED ORDER — ASPIRIN EC 81 MG PO TBEC
81.0000 mg | DELAYED_RELEASE_TABLET | Freq: Every day | ORAL | Status: DC
  Administered 2016-04-16 – 2016-04-28 (×13): 81 mg via ORAL
  Filled 2016-04-16 (×13): qty 1

## 2016-04-16 NOTE — ED Provider Notes (Signed)
Emergency Department Provider Note   I have reviewed the triage vital signs and the nursing notes.   HISTORY  Chief Complaint Fever   HPI Nicholas Hudson is a 76 y.o. male with PMH HLD and thyroid disease presents to the emergency department for evaluation of 1 week of fever and worsening confusion with decreased urination, lower extremity edema, and new onset renal failure. The patient has been on multiple antibiotics since late December for sinus infection. The wife notes 1 week of fever in the 101 range. He has been on clarithromycin and stopped this on Thursday. He saw his primary care provider who did lab work and called yesterday saying that he was in renal failure. He was encouraged to increase his fluid intake which the patient is done but has not been urinating more frequently. His wife notes worsening swelling and pain of the feet. No vomiting or diarrhea. No headache. The patient has also had some mild cough and generalized weakness.    Past Medical History:  Diagnosis Date  . BPH (benign prostatic hyperplasia)   . Gout   . Hyperlipidemia   . Renal disorder   . Thyroid disease     Patient Active Problem List   Diagnosis Date Noted  . Sepsis ([place]) 04/17/2016  . Metabolic encephalopathy 75/44/9201  . AKI (acute kidney injury) ([place]) 04/16/2016  . Abnormal transaminases 04/16/2016  . Frequent headaches 04/16/2016  . Hypothyroidism 04/16/2016  . Hyponatremia 04/16/2016    History reviewed. No pertinent surgical history.    Allergies Patient has no known allergies.  History reviewed. No pertinent family history.  Social History Social History  Substance Use Topics  . Smoking status: Never Smoker  . Smokeless tobacco: Never Used  . Alcohol use No    Review of Systems  Constitutional: No fever/chills. Positive fatigue and generalized weakness.  Eyes: No visual changes. ENT: No sore throat. Cardiovascular: Denies chest pain. Respiratory: Denies  shortness of breath. Positive cough.  Gastrointestinal: No abdominal pain.  No nausea, no vomiting.  No diarrhea.  No constipation. Genitourinary: Negative for dysuria. Positive decreased urine output.  Musculoskeletal: Negative for back pain. Skin: Negative for rash. Neurological: Negative for headaches, focal weakness or numbness.  10-point ROS otherwise negative.  ____________________________________________   PHYSICAL EXAM:  VITAL SIGNS: ED Triage Vitals  Enc Vitals Group     BP 04/16/16 1430 146/79     Pulse Rate 04/16/16 1430 88     Resp 04/16/16 1430 18     Temp 04/16/16 1430 98.6 F (37 C)     Temp Source 04/16/16 1430 Oral     SpO2 04/16/16 1430 100 %     Weight 04/16/16 1446 185 lb (83.9 kg)     Height 04/16/16 1446 '5\' 5"'  (1.651 m)   Constitutional: Alert and oriented. Well appearing and in no acute distress. Eyes: Conjunctivae are normal.  Head: Atraumatic. Nose: No congestion/rhinnorhea. Mouth/Throat: Mucous membranes are dry.  Oropharynx non-erythematous. Neck: No stridor.   Cardiovascular: Normal rate, regular rhythm. Good peripheral circulation. Grossly normal heart sounds.   Respiratory: Normal respiratory effort.  No retractions. Lungs CTAB. Gastrointestinal: Soft and nontender. No distention.  Musculoskeletal: No lower extremity tenderness with trace pitting edema. No gross deformities of extremities. Neurologic:  Normal speech and language. No gross focal neurologic deficits are appreciated.  Skin:  Skin is warm, dry and intact. No rash noted. Psychiatric: Mood and affect are normal. Speech and behavior are normal.  ____________________________________________   LABS (all labs  ordered are listed, but only abnormal results are displayed)  Labs Reviewed  COMPREHENSIVE METABOLIC PANEL - Abnormal; Notable for the following:       Result Value   Sodium 126 (*)    Potassium 5.7 (*)    Chloride 93 (*)    CO2 20 (*)    Glucose, Bld 111 (*)    BUN 43  (*)    Creatinine, Ser 4.46 (*)    Calcium 7.9 (*)    Albumin 2.7 (*)    Alkaline Phosphatase 378 (*)    GFR calc non Af Amer 12 (*)    GFR calc Af Amer 13 (*)    All other components within normal limits  URINALYSIS, ROUTINE W REFLEX MICROSCOPIC - Abnormal; Notable for the following:    Color, Urine YELLOW (*)    APPearance HAZY (*)    Specific Gravity, Urine 1.004 (*)    Hgb urine dipstick MODERATE (*)    Protein, ur 30 (*)    Bacteria, UA RARE (*)    Squamous Epithelial / LPF 0-5 (*)    All other components within normal limits  PROTIME-INR - Abnormal; Notable for the following:    Prothrombin Time 15.4 (*)    All other components within normal limits  TSH - Abnormal; Notable for the following:    TSH 5.396 (*)    All other components within normal limits  T4, FREE - Abnormal; Notable for the following:    Free T4 1.16 (*)    All other components within normal limits  CBC - Abnormal; Notable for the following:    WBC 10.7 (*)    RBC 3.72 (*)    Hemoglobin 11.3 (*)    HCT 32.2 (*)    All other components within normal limits  COMPREHENSIVE METABOLIC PANEL - Abnormal; Notable for the following:    Sodium 129 (*)    Chloride 96 (*)    CO2 19 (*)    Glucose, Bld 125 (*)    BUN 44 (*)    Creatinine, Ser 4.84 (*)    Calcium 7.4 (*)    Albumin 2.4 (*)    Alkaline Phosphatase 336 (*)    GFR calc non Af Amer 10 (*)    GFR calc Af Amer 12 (*)    All other components within normal limits  GAMMA GT - Abnormal; Notable for the following:    GGT 81 (*)    All other components within normal limits  BASIC METABOLIC PANEL - Abnormal; Notable for the following:    Sodium 128 (*)    Chloride 95 (*)    Glucose, Bld 127 (*)    BUN 44 (*)    Creatinine, Ser 4.84 (*)    Calcium 7.7 (*)    GFR calc non Af Amer 11 (*)    GFR calc Af Amer 12 (*)    All other components within normal limits  CULTURE, BLOOD (ROUTINE X 2)  CULTURE, BLOOD (ROUTINE X 2)  URINE CULTURE  CULTURE, BLOOD  (ROUTINE X 2)  CULTURE, BLOOD (ROUTINE X 2)  CBC WITH DIFFERENTIAL/PLATELET  INFLUENZA PANEL BY PCR (TYPE A & B)  CK  SODIUM, URINE, RANDOM  CREATININE, URINE, RANDOM  AMMONIA  URINALYSIS, ROUTINE W REFLEX MICROSCOPIC  T3, FREE  MULTIPLE MYELOMA PANEL, SERUM  CBC WITH DIFFERENTIAL/PLATELET  COMPREHENSIVE METABOLIC PANEL  LACTIC ACID, PLASMA  LACTIC ACID, PLASMA  PROCALCITONIN  PROTIME-INR  APTT  I-STAT CG4 LACTIC ACID, ED   ____________________________________________  RADIOLOGY  Dg Chest 2 View  Result Date: 04/16/2016 CLINICAL DATA:  Dyspnea and shortness of breath. EXAM: CHEST  2 VIEW COMPARISON:  None. FINDINGS: The cardiac silhouette, mediastinal and hilar contours are within normal limits. There is tortuosity and calcification of the thoracic aorta. There are streaky areas of bibasilar atelectasis or scarring but no infiltrates, edema or effusions. The bony thorax is intact. IMPRESSION: Streaky bibasilar scarring or atelectasis but no infiltrates, edema or effusions. Electronically Signed   By: [name] M.D.   On: 04/16/2016 16:36    ____________________________________________   PROCEDURES  Procedure(s) performed:   Procedures  None ____________________________________________   INITIAL IMPRESSION / ASSESSMENT AND PLAN / ED COURSE  Pertinent labs & imaging results that were available during my care of the patient were reviewed by me and considered in my medical decision making (see chart for details).  Patient resents to the emergency department for evaluation of fever, mild confusion, cough, decreased urine output in the setting of recently finishing clarithromycin for sinus infection. He saw his primary care provider several days ago and was told he was in acute renal failure encouraged to increase his fluid intake which is done foaming worsening symptoms. Suspect the patient may be an new renal failure a sinus symptoms and exam. We will initiate infectious  workup including flu. Chest x-ray pending. Patient is awake and alert at this time in no acute distress.   Patient with new renal failure and electrolyte derangement. Started IVF. EKG with no evidence of hyperkalemia changes. Plan for admission.   Discussed patient's case with hospitlaist, Dr. Tyrell Antonio.  Recommend admission to inpatient, telemetry bed.  I will place holding orders per their request. Patient and family (if present) updated with plan. Care transferred to hospitalist service.  I reviewed all nursing notes, vitals, pertinent old records, EKGs, labs, imaging (as available).  ____________________________________________  FINAL CLINICAL IMPRESSION(S) / ED DIAGNOSES  Final diagnoses:  Acute renal failure, unspecified acute renal failure type ([place])     MEDICATIONS GIVEN DURING THIS VISIT:  Medications  aspirin EC tablet 81 mg (81 mg Oral Given 04/16/16 2015)  levothyroxine (SYNTHROID, LEVOTHROID) tablet 200 mcg (200 mcg Oral Given 04/17/16 0815)  finasteride (PROSCAR) tablet 5 mg (5 mg Oral Given 04/16/16 2015)  rosuvastatin (CRESTOR) tablet 20 mg (20 mg Oral Given 04/16/16 2015)  heparin injection 5,000 Units (5,000 Units Subcutaneous Given 04/17/16 0600)  0.9 %  sodium chloride infusion ( Intravenous New Bag/Given 04/17/16 0300)  acetaminophen (TYLENOL) tablet 650 mg (not administered)    Or  acetaminophen (TYLENOL) suppository 650 mg (not administered)  traMADol (ULTRAM) tablet 50 mg (50 mg Oral Given 04/16/16 2346)  senna (SENOKOT) tablet 8.6 mg (8.6 mg Oral Given 04/16/16 2014)  ondansetron (ZOFRAN) tablet 4 mg ( Oral See Alternative 04/16/16 1902)    Or  ondansetron (ZOFRAN) injection 4 mg (4 mg Intravenous Given 04/16/16 1902)  pneumococcal 23 valent vaccine (PNU-IMMUNE) injection 0.5 mL (not administered)  gabapentin (NEURONTIN) capsule 300 mg (300 mg Oral Given 04/16/16 2054)  zolpidem (AMBIEN) tablet 5 mg (5 mg Oral Given 04/16/16 2054)  sodium chloride 0.9 % bolus 2,000 mL (1,000 mLs  Intravenous Given 04/17/16 0815)  piperacillin-tazobactam (ZOSYN) IVPB 2.25 g (not administered)  vancomycin (VANCOCIN) IVPB 1000 mg/200 mL premix (not administered)  sodium chloride 0.9 % bolus 500 mL (0 mLs Intravenous Stopped 04/16/16 1741)  0.9 %  sodium chloride infusion (100 mL/hr Intravenous New Bag/Given 04/16/16 1740)  sodium polystyrene (KAYEXALATE) 15 GM/60ML suspension  30 g (30 g Oral Given 04/16/16 2013)  sodium chloride 0.9 % bolus 1,000 mL (1,000 mLs Intravenous Given 04/17/16 0651)     NEW OUTPATIENT MEDICATIONS STARTED DURING THIS VISIT:  None   Note:  This document was prepared using Dragon voice recognition software and may include unintentional dictation errors.  Nanda Quinton, MD Emergency Medicine   Margette Fast, MD 04/17/16 [phone]

## 2016-04-16 NOTE — ED Notes (Signed)
Bed: WA04 Expected date:  Expected time:  Means of arrival:  Comments: 

## 2016-04-16 NOTE — ED Triage Notes (Signed)
Per family member, states he was diagnosed with a sinus infection over christmas-took 2 rounds of antibiotics-had gotten better-past week he was becoming weak-was seen by PCP and diagnosed with acute renal failure-has been getting weaker and "altered" per family-states possible dysuria in relation to increased PO intake

## 2016-04-16 NOTE — ED Notes (Signed)
hospitalist at bedside

## 2016-04-16 NOTE — ED Notes (Signed)
Patient transported to x-ray. ?

## 2016-04-16 NOTE — H&P (Signed)
History and Physical    Nicholas Hudson YKD:[id number] DOB: 10-28-1940 DOA: 04/16/2016  PCP: Kandice Hams, MD  Patient coming from: Home   Chief Complaint: generalized weakness, confusion.   HPI: Nicholas Hudson is a 76 y.o. male with medical history significant of PBH, gout, recurrent sinus infection, hypothyroidism. Patient with history of sinus, polyps  problems, infection for last 6 to 8 weeks, he has received 2 course of antibiotics by Dr Lucia Gaskins. Since 6 to 8 weeks ago he has been having headaches, less active, more sleepy/. Since 4 days ago he was  notice to be confuse, and lethargic by family. He has had poor oral intake. They report fever 100.1 at home. Patient was seeing by his PCP 2 days ago and lab work was consistent with low sodium and renal failure, Unclear cr level at that time. He was advised to drink plenty of fluids. He presents today with inability to drink plenty amount of fluids due to nausea and vomiting. He vomited once, bile content. He denies flank pain or abdominal pain. He has some time problems initiating urine stream but he has been able to empty his bladder.  Progressive weakness over last 6 weeks, worse over last few days.  Of note patient takes metformin from his wife prescriptions. He doesn't take synthroid every day.   ED Course: Patient was found to have Cr at 4, sodium 126, k at 5.7, BUN 43, chest x ray with atelectasis, WBC at 8.4, Hb 13. Patient received 500 cc of NS and he was able to urinate 350 cc.   Review of Systems: As per HPI otherwise 10 point review of systems negative.   Past Medical History:  Diagnosis Date  . BPH (benign prostatic hyperplasia)   . Gout   . Hyperlipidemia   . Renal disorder   . Thyroid disease     History reviewed. No pertinent surgical history.   reports that he has never smoked. He does not have any smokeless tobacco history on file. He reports that he does not drink alcohol. His drug history is not on file.  No Known  Allergies  Family History; mother had history of hearth problems.   Prior to Admission medications   Medication Sig Start Date End Date Taking? Authorizing Provider  allopurinol (ZYLOPRIM) 100 MG tablet Take 1 tablet (100 mg total) by mouth daily. 05/28/14  Yes Wallene Huh, DPM  aspirin EC 81 MG tablet Take 81 mg by mouth daily.   Yes Historical Provider, MD  cholecalciferol (VITAMIN D) 1000 UNITS tablet Take 2,000 Units by mouth daily.   Yes Historical Provider, MD  cyanocobalamin 2000 MCG tablet Take 2,000 mcg by mouth daily.   Yes Historical Provider, MD  finasteride (PROSCAR) 5 MG tablet Take 5 mg by mouth daily.   Yes Historical Provider, MD  gabapentin (NEURONTIN) 300 MG capsule Take 300 mg by mouth at bedtime. 04/14/16  Yes Historical Provider, MD  levothyroxine (SYNTHROID, LEVOTHROID) 200 MCG tablet Take 200 mcg by mouth daily before breakfast.  04/16/16  Yes Historical Provider, MD  Omega-3 Fatty Acids (FISH OIL) 1000 MG CAPS Take 1,000 mg by mouth daily.    Yes Historical Provider, MD  rosuvastatin (CRESTOR) 20 MG tablet Take 20 mg by mouth daily.   Yes Historical Provider, MD    Physical Exam: Vitals:   04/16/16 1430 04/16/16 1446 04/16/16 1630 04/16/16 1730  BP: 146/79  145/72 146/79  Pulse: 91  88 89  Resp: 18   18  Temp: 98.6 F (37 C)     TempSrc: Oral     SpO2: 99%  98% 97%  Weight:  83.9 kg (185 lb)    Height:  '5\' 5"'  (1.651 m)        Constitutional: NAD, calm, comfortable Vitals:   04/16/16 1430 04/16/16 1446 04/16/16 1630 04/16/16 1730  BP: 146/79  145/72 146/79  Pulse: 91  88 89  Resp: 18   18  Temp: 98.6 F (37 C)     TempSrc: Oral     SpO2: 99%  98% 97%  Weight:  83.9 kg (185 lb)    Height:  '5\' 5"'  (1.651 m)     Eyes: PERRL, lids and conjunctivae normal, keeps eyes close, but he is able to have conversations.  ENMT: Mucous membranes are dry.  Posterior pharynx clear of any exudate or lesions.Normal dentition.  Neck: normal, supple, no masses, no  thyromegaly Respiratory: clear to auscultation bilaterally, no wheezing, no crackles. Normal respiratory effort. No accessory muscle use.  Cardiovascular: Regular rate and rhythm, no murmurs / rubs / gallops. No extremity edema. 2+ pedal pulses. No carotid bruits.  Abdomen: no tenderness, no masses palpated. No hepatosplenomegaly. Bowel sounds positive.  Musculoskeletal: no clubbing / cyanosis. No joint deformity upper and lower extremities. Good ROM, no contractures. Normal muscle tone.  Skin: no rashes, lesions, ulcers. No induration Neurologic: CN 2-12 grossly intact. Sensation intact, DTR normal. Strength 5/5 in all 4.  Psychiatric:  Alert and oriented x 3. Normal mood.     Labs on Admission: I have personally reviewed following labs and imaging studies  CBC:  Recent Labs Lab 04/16/16 1535  WBC 8.4  NEUTROABS 6.6  HGB 13.6  HCT 40.1  MCV 85.1  PLT 606   Basic Metabolic Panel:  Recent Labs Lab 04/16/16 1535  NA 126*  K 5.7*  CL 93*  CO2 20*  GLUCOSE 111*  BUN 43*  CREATININE 4.46*  CALCIUM 7.9*   GFR: Estimated Creatinine Clearance: 14.1 mL/min (by C-G formula based on SCr of 4.46 mg/dL (H)). Liver Function Tests:  Recent Labs Lab 04/16/16 1535  AST 41  ALT 30  ALKPHOS 378*  BILITOT 1.2  PROT 6.6  ALBUMIN 2.7*   No results for input(s): LIPASE, AMYLASE in the last 168 hours. No results for input(s): AMMONIA in the last 168 hours. Coagulation Profile:  Recent Labs Lab 04/16/16 1615  INR 1.21   Cardiac Enzymes:  Recent Labs Lab 04/16/16 1537  CKTOTAL 92   BNP (last 3 results) No results for input(s): PROBNP in the last 8760 hours. HbA1C: No results for input(s): HGBA1C in the last 72 hours. CBG: No results for input(s): GLUCAP in the last 168 hours. Lipid Profile: No results for input(s): CHOL, HDL, LDLCALC, TRIG, CHOLHDL, LDLDIRECT in the last 72 hours. Thyroid Function Tests: No results for input(s): TSH, T4TOTAL, FREET4, T3FREE,  THYROIDAB in the last 72 hours. Anemia Panel: No results for input(s): VITAMINB12, FOLATE, FERRITIN, TIBC, IRON, RETICCTPCT in the last 72 hours. Urine analysis:    Component Value Date/Time   COLORURINE YELLOW (A) 04/16/2016 1623   APPEARANCEUR HAZY (A) 04/16/2016 1623   LABSPEC 1.004 (L) 04/16/2016 1623   PHURINE 5.0 04/16/2016 1623   GLUCOSEU NEGATIVE 04/16/2016 1623   HGBUR MODERATE (A) 04/16/2016 1623   BILIRUBINUR NEGATIVE 04/16/2016 1623   KETONESUR NEGATIVE 04/16/2016 1623   PROTEINUR 30 (A) 04/16/2016 1623   NITRITE NEGATIVE 04/16/2016 1623   LEUKOCYTESUR NEGATIVE 04/16/2016 1623   Sepsis  Labs: !!!!!!!!!!!!!!!!!!!!!!!!!!!!!!!!!!!!!!!!!!!! '@[email or web address]' (procalcitonin:4,lacticidven:4) )No results found for this or any previous visit (from the past 240 hour(s)).   Radiological Exams on Admission: Dg Chest 2 View  Result Date: 04/16/2016 CLINICAL DATA:  Dyspnea and shortness of breath. EXAM: CHEST  2 VIEW COMPARISON:  None. FINDINGS: The cardiac silhouette, mediastinal and hilar contours are within normal limits. There is tortuosity and calcification of the thoracic aorta. There are streaky areas of bibasilar atelectasis or scarring but no infiltrates, edema or effusions. The bony thorax is intact. IMPRESSION: Streaky bibasilar scarring or atelectasis but no infiltrates, edema or effusions. Electronically Signed   By: [name] M.D.   On: 04/16/2016 16:36    EKG:not available, will order EKG  Assessment/Plan Active Problems:   Acute renal failure (H[facility])   Abnormal transaminases   Frequent headaches   Hypothyroidism   Hyponatremia  1-Acute Renal failure; unclear prior cr level.  Could be related to medication (recent course or clarithromycin), vs poor oral intake, hypovolemia related to vomiting.  Will check urine sodium, cr. Check Multiple myeloma panel.  [place] ordered. Bladder scan,.  IV fluids.  Strict I and O, daily weight.  If no improvement will need to consult  renal.   2-Generalized weakness;  Multifactorial; dehydration. Hypothyroidism. Per family TSH was elevated, synthroid recently increase.   3-Confusion, weakness, headaches.  Check MRI brain (recent sinus infection) . Ammonia level.   4-Hypothyroidism. Continue with synthroid. Check TSH.   5-Hyponatremia; suspect related to hypovolemia and renal failure; Continue with IV fluids.   6-Transaminases; check abdominal US. Check GTT  7-BPH; continue with finasteride.  8-Neuropathy; hold gabapentin due to AKI.    DVT prophylaxis: Heparin  Code Status: full code.  Family Communication: care discussed with patient and wife, and daughter  Disposition Plan: home at time of discharge  Consults called: none Admission status: inpatient , telemetry , unable to tolerates oral at home.    Elmarie Shiley MD [facility] Pager [phone]  If 7PM-7AM, please contact night-coverage [email or web address] Password [facility]  04/16/2016, 6:31 PM

## 2016-04-17 ENCOUNTER — Inpatient Hospital Stay (HOSPITAL_COMMUNITY): Payer: Medicare Other

## 2016-04-17 ENCOUNTER — Encounter (HOSPITAL_COMMUNITY): Payer: Self-pay | Admitting: General Practice

## 2016-04-17 DIAGNOSIS — G9341 Metabolic encephalopathy: Secondary | ICD-10-CM

## 2016-04-17 DIAGNOSIS — E871 Hypo-osmolality and hyponatremia: Secondary | ICD-10-CM

## 2016-04-17 DIAGNOSIS — A419 Sepsis, unspecified organism: Secondary | ICD-10-CM

## 2016-04-17 LAB — CBC WITH DIFFERENTIAL/PLATELET
Basophils Absolute: 0 10*3/uL (ref 0.0–0.1)
Basophils Relative: 0 %
Eosinophils Absolute: 0.1 10*3/uL (ref 0.0–0.7)
Eosinophils Relative: 2 %
HEMATOCRIT: 30 % — AB (ref 39.0–52.0)
HEMOGLOBIN: 10.2 g/dL — AB (ref 13.0–17.0)
LYMPHS ABS: 1 10*3/uL (ref 0.7–4.0)
LYMPHS PCT: 11 %
MCH: 29.1 pg (ref 26.0–34.0)
MCHC: 34 g/dL (ref 30.0–36.0)
MCV: 85.7 fL (ref 78.0–100.0)
MONOS PCT: 8 %
Monocytes Absolute: 0.8 10*3/uL (ref 0.1–1.0)
NEUTROS ABS: 7.5 10*3/uL (ref 1.7–7.7)
Neutrophils Relative %: 79 %
Platelets: 185 10*3/uL (ref 150–400)
RBC: 3.5 MIL/uL — AB (ref 4.22–5.81)
RDW: 13.2 % (ref 11.5–15.5)
WBC: 9.4 10*3/uL (ref 4.0–10.5)

## 2016-04-17 LAB — CBC
HCT: 32.2 % — ABNORMAL LOW (ref 39.0–52.0)
Hemoglobin: 11.3 g/dL — ABNORMAL LOW (ref 13.0–17.0)
MCH: 30.4 pg (ref 26.0–34.0)
MCHC: 35.1 g/dL (ref 30.0–36.0)
MCV: 86.6 fL (ref 78.0–100.0)
PLATELETS: 203 10*3/uL (ref 150–400)
RBC: 3.72 MIL/uL — AB (ref 4.22–5.81)
RDW: 13.1 % (ref 11.5–15.5)
WBC: 10.7 10*3/uL — ABNORMAL HIGH (ref 4.0–10.5)

## 2016-04-17 LAB — COMPREHENSIVE METABOLIC PANEL
ALK PHOS: 304 U/L — AB (ref 38–126)
ALT: 28 U/L (ref 17–63)
ALT: 24 U/L (ref 17–63)
AST: 19 U/L (ref 15–41)
AST: 16 U/L (ref 15–41)
Albumin: 2.4 g/dL — ABNORMAL LOW (ref 3.5–5.0)
Albumin: 2.2 g/dL — ABNORMAL LOW (ref 3.5–5.0)
Alkaline Phosphatase: 336 U/L — ABNORMAL HIGH (ref 38–126)
Anion gap: 14 (ref 5–15)
Anion gap: 10 (ref 5–15)
BILIRUBIN TOTAL: 0.8 mg/dL (ref 0.3–1.2)
BUN: 44 mg/dL — ABNORMAL HIGH (ref 6–20)
BUN: 45 mg/dL — ABNORMAL HIGH (ref 6–20)
CALCIUM: 6.9 mg/dL — AB (ref 8.9–10.3)
CHLORIDE: 96 mmol/L — AB (ref 101–111)
CO2: 19 mmol/L — ABNORMAL LOW (ref 22–32)
CO2: 19 mmol/L — AB (ref 22–32)
CREATININE: 4.84 mg/dL — AB (ref 0.61–1.24)
CREATININE: 4.66 mg/dL — AB (ref 0.61–1.24)
Calcium: 7.4 mg/dL — ABNORMAL LOW (ref 8.9–10.3)
Chloride: 100 mmol/L — ABNORMAL LOW (ref 101–111)
GFR, EST AFRICAN AMERICAN: 12 mL/min — AB (ref >60)
GFR, EST AFRICAN AMERICAN: 13 mL/min — AB (ref >60)
GFR, EST NON AFRICAN AMERICAN: 10 mL/min — AB (ref >60)
GFR, EST NON AFRICAN AMERICAN: 11 mL/min — AB (ref >60)
Glucose, Bld: 125 mg/dL — ABNORMAL HIGH (ref 65–99)
Glucose, Bld: 115 mg/dL — ABNORMAL HIGH (ref 65–99)
Potassium: 4 mmol/L (ref 3.5–5.1)
Potassium: 4 mmol/L (ref 3.5–5.1)
SODIUM: 129 mmol/L — AB (ref 135–145)
Sodium: 129 mmol/L — ABNORMAL LOW (ref 135–145)
TOTAL PROTEIN: 6.6 g/dL (ref 6.5–8.1)
TOTAL PROTEIN: 5.7 g/dL — AB (ref 6.5–8.1)
Total Bilirubin: 0.8 mg/dL (ref 0.3–1.2)

## 2016-04-17 LAB — PROTIME-INR
INR: 1.29
Prothrombin Time: 16.1 seconds — ABNORMAL HIGH (ref 11.4–15.2)

## 2016-04-17 LAB — SODIUM, URINE, RANDOM: Sodium, Ur: 10 mmol/L

## 2016-04-17 LAB — GAMMA GT: GGT: 81 U/L — AB (ref 7–50)

## 2016-04-17 LAB — APTT: APTT: 39 s — AB (ref 24–36)

## 2016-04-17 LAB — LACTIC ACID, PLASMA
LACTIC ACID, VENOUS: 0.7 mmol/L (ref 0.5–1.9)
Lactic Acid, Venous: 0.8 mmol/L (ref 0.5–1.9)

## 2016-04-17 LAB — T4, FREE: FREE T4: 1.16 ng/dL — AB (ref 0.61–1.12)

## 2016-04-17 LAB — CREATININE, URINE, RANDOM: CREATININE, URINE: 39.89 mg/dL

## 2016-04-17 LAB — PROCALCITONIN: Procalcitonin: 0.94 ng/mL

## 2016-04-17 MED ORDER — VANCOMYCIN HCL IN DEXTROSE 1-5 GM/200ML-% IV SOLN
1000.0000 mg | Freq: Once | INTRAVENOUS | Status: AC
  Administered 2016-04-17: 1000 mg via INTRAVENOUS
  Filled 2016-04-17: qty 200

## 2016-04-17 MED ORDER — SODIUM CHLORIDE 0.9 % IV BOLUS (SEPSIS)
2000.0000 mL | Freq: Once | INTRAVENOUS | Status: AC
  Administered 2016-04-17: 1000 mL via INTRAVENOUS

## 2016-04-17 MED ORDER — SODIUM CHLORIDE 0.9 % IV BOLUS (SEPSIS)
1000.0000 mL | Freq: Once | INTRAVENOUS | Status: AC
  Administered 2016-04-17: 1000 mL via INTRAVENOUS

## 2016-04-17 MED ORDER — VANCOMYCIN HCL IN DEXTROSE 1-5 GM/200ML-% IV SOLN: 1000.0000 mg | INTRAVENOUS | Status: DC

## 2016-04-17 MED ORDER — PIPERACILLIN-TAZOBACTAM IN DEX 2-0.25 GM/50ML IV SOLN
2.2500 g | Freq: Once | INTRAVENOUS | Status: AC
  Administered 2016-04-17: 2.25 g via INTRAVENOUS
  Filled 2016-04-17: qty 50

## 2016-04-17 MED ORDER — PIPERACILLIN-TAZOBACTAM IN DEX 2-0.25 GM/50ML IV SOLN
2.2500 g | Freq: Three times a day (TID) | INTRAVENOUS | Status: DC
  Administered 2016-04-18 – 2016-04-20 (×8): 2.25 g via INTRAVENOUS
  Filled 2016-04-17 (×13): qty 50

## 2016-04-17 NOTE — Plan of Care (Signed)
Problem: Education: Goal: Knowledge of [facility] Education information/materials will improve Outcome: Completed/Met Date Met: 04/17/16 Provider discussed with patient. RN discussed with family

## 2016-04-17 NOTE — Progress Notes (Signed)
Patient was out of bed to rest room x2. Pt did not use urinal to record output. Education reinforced regarding the importance of measurement.

## 2016-04-17 NOTE — Progress Notes (Signed)
Pharmacy Antibiotic Note  Nicholas Hudson is a 76 y.o. male admitted on 04/16/2016 with sepsis.  Pharmacy has been consulted for vancomycin and zosy dosing.  Plan:  Vancomycin 1g IV q48h  Check trough at steady state, goal 15-20 mcg/ml  Zosyn 2.25g IV q8h  Follow up renal function & cultures, de-escalate as appropriate  Height: 5\' 5"  (165.1 cm) Weight: 189 lb 6 oz (85.9 kg) IBW/kg (Calculated) : 61.5  Temp (24hrs), Avg:99.8 F (37.7 C), Min:98.6 F (37 C), Max:100.5 F (38.1 C)   Recent Labs Lab 04/16/16 1535 04/16/16 1556 04/16/16 2141 04/17/16 0509  WBC 8.4  --   --  10.7*  CREATININE 4.46*  --  4.84* 4.84*  LATICACIDVEN  --  1.47  --   --     Estimated Creatinine Clearance: 12.9 mL/min (by C-G formula based on SCr of 4.84 mg/dL (H)).    No Known Allergies  Antimicrobials this admission:  2/4 Vanc >> 2/4 Zosyn >>  Dose adjustments this admission:  ---  Microbiology results:  2/3 BCx: sent 2/3 UCx: sent  2/4 BCx: sent 2/3 Influenza panel: neg  Thank you for allowing pharmacy to be a part of this patient's care.  Peggyann Juba, PharmD, BCPS Pager: [phone] 04/17/2016 8:47 AM

## 2016-04-17 NOTE — Progress Notes (Signed)
[facility] PROGRESS NOTE    Progress Note  Nicholas Hudson  [id number] DOB: 12-Feb-1941 DOA: 04/16/2016 PCP: Kandice Hams, MD     Brief Narrative:   Nicholas Hudson is an 76 y.o. male with medical history significant of PBH, gout, recurrent sinus infection, hypothyroidism. Patient with history of sinus, polyps  problems, infection for last 6 to 8 weeks, he has received 2 course of antibiotics by Dr Lucia Gaskins. Since 6 to 8 weeks ago he has been having headaches, less active, more sleepy/. Since 4 days ago he was  notice to be confuse, and lethargic by family.Mild leukocytosis worsening renal function and mild fever.  Assessment/Plan:   SIRS: New Mild fever, leukocytosis and acute renal failure with a toxic metabolic encephalopathy. I am concerned about an infectious etiology, will repeat chest x-ray start him on sepsis pathway, start him empirically on IV Vanco and Zosyn. Blood cultures 2, will hydrate him aggressively. MRI pending Influenza PCR was negative UA does not show any signs of infection. Urinary sodium was 10 which points towards prerenal.  Metabolic encephalopathy: MRI is pending likely due to infectious and aki etiology. Now resolved.  AKI: Urinary sodium was 10, will start him on aggressive IV fluid hydration recheck a basic metabolic panel in the morning.  Abnormal transaminases Slight elevation of alkaline phosphatase, bilirubin AST and ALT are within normal limits. GGT and abdominal ultrasound are pending.  Hypothyroidism Continue Synthroid.  Hyponatremia Cont IV fluids  DVT prophylaxis: lovenox Family Communication:none Disposition Plan/Barrier to D/C: home in 5 days Code Status:     Code Status Orders        Start     Ordered   04/16/16 1839  Full code  Continuous     04/16/16 1838    Code Status History    Date Active Date Inactive Code Status Order ID Comments User Context   This patient has a current code status but no historical code  status.        IV Access:    Peripheral IV   Procedures and diagnostic studies:   Dg Chest 2 View  Result Date: 04/16/2016 CLINICAL DATA:  Dyspnea and shortness of breath. EXAM: CHEST  2 VIEW COMPARISON:  None. FINDINGS: The cardiac silhouette, mediastinal and hilar contours are within normal limits. There is tortuosity and calcification of the thoracic aorta. There are streaky areas of bibasilar atelectasis or scarring but no infiltrates, edema or effusions. The bony thorax is intact. IMPRESSION: Streaky bibasilar scarring or atelectasis but no infiltrates, edema or effusions. Electronically Signed   By: [name] M.D.   On: 04/16/2016 16:36     Medical Consultants:    None.  Anti-Infectives:   Vanc ans zosyn  Subjective:    Nicholas Hudson he relates he feels better and yesterday. Still weak and extremely thirsty.  Objective:    Vitals:   04/16/16 1730 04/16/16 1838 04/16/16 2055 04/17/16 0552  BP: 146/79 (!) 144/91 (!) 151/87 119/84  Pulse: 89 89 91 (!) 101  Resp: 18 18  20   Temp:  100.2 F (37.9 C) 99.8 F (37.7 C) (!) 100.5 F (38.1 C)  TempSrc:  Oral Oral Oral  SpO2: 97% 100% 98% 94%  Weight:  84.1 kg (185 lb 6.5 oz)  85.9 kg (189 lb 6 oz)  Height:  5\' 5"  (1.651 m)      Intake/Output Summary (Last 24 hours) at 04/17/16 0804 Last data filed at 04/17/16 0600  Gross per 24 hour  Intake             1985 ml  Output              800 ml  Net             1185 ml   Filed Weights   04/16/16 1446 04/16/16 1838 04/17/16 0552  Weight: 83.9 kg (185 lb) 84.1 kg (185 lb 6.5 oz) 85.9 kg (189 lb 6 oz)    Exam: General exam: In no acute distress. Respiratory system: Good air movement and clear to auscultation. Cardiovascular system: S1 & S2 heard, RRR. No JVD. Gastrointestinal system: Abdomen is nondistended, soft and nontender.  Central nervous system: Alert and oriented. No focal neurological deficits.No nuchal rigidity Extremities: No pedal edema. Skin:  No rashes culture ulceration. Psychiatry: Judgement and insight appear normal. Mood & affect appropriate.    Data Reviewed:    Labs: Basic Metabolic Panel:  Recent Labs Lab 04/16/16 1535 04/16/16 2141 04/17/16 0509  NA 126* 128* 129*  K 5.7* 5.0 4.0  CL 93* 95* 96*  CO2 20* 22 19*  GLUCOSE 111* 127* 125*  BUN 43* 44* 44*  CREATININE 4.46* 4.84* 4.84*  CALCIUM 7.9* 7.7* 7.4*   GFR Estimated Creatinine Clearance: 12.9 mL/min (by C-G formula based on SCr of 4.84 mg/dL (H)). Liver Function Tests:  Recent Labs Lab 04/16/16 1535 04/17/16 0509  AST 41 19  ALT 30 28  ALKPHOS 378* 336*  BILITOT 1.2 0.8  PROT 6.6 6.6  ALBUMIN 2.7* 2.4*   No results for input(s): LIPASE, AMYLASE in the last 168 hours.  Recent Labs Lab 04/16/16 1900  AMMONIA 11   Coagulation profile  Recent Labs Lab 04/16/16 1615  INR 1.21    CBC:  Recent Labs Lab 04/16/16 1535 04/17/16 0509  WBC 8.4 10.7*  NEUTROABS 6.6  --   HGB 13.6 11.3*  HCT 40.1 32.2*  MCV 85.1 86.6  PLT 194 203   Cardiac Enzymes:  Recent Labs Lab 04/16/16 1537  CKTOTAL 92   BNP (last 3 results) No results for input(s): PROBNP in the last 8760 hours. CBG: No results for input(s): GLUCAP in the last 168 hours. D-Dimer: No results for input(s): DDIMER in the last 72 hours. Hgb A1c: No results for input(s): HGBA1C in the last 72 hours. Lipid Profile: No results for input(s): CHOL, HDL, LDLCALC, TRIG, CHOLHDL, LDLDIRECT in the last 72 hours. Thyroid function studies:  Recent Labs  04/16/16 1906  TSH 5.396*   Anemia work up: No results for input(s): VITAMINB12, FOLATE, FERRITIN, TIBC, IRON, RETICCTPCT in the last 72 hours. Sepsis Labs:  Recent Labs Lab 04/16/16 1535 04/16/16 1556 04/17/16 0509  WBC 8.4  --  10.7*  LATICACIDVEN  --  1.47  --    Microbiology No results found for this or any previous visit (from the past 240 hour(s)).   Medications:   . aspirin EC  81 mg Oral Daily  .  finasteride  5 mg Oral Daily  . gabapentin  300 mg Oral QHS  . heparin  5,000 Units Subcutaneous Q8H  . levothyroxine  200 mcg Oral QAC breakfast  . pneumococcal 23 valent vaccine  0.5 mL Intramuscular Tomorrow-1000  . rosuvastatin  20 mg Oral Daily  . senna  1 tablet Oral BID   Continuous Infusions: . sodium chloride 100 mL/hr at 04/17/16 0300    Time spent: 35 min   LOS: 1 day   Charlynne Cousins  [facility] Pager [phone]  *  Please refer to [email or web address], password [email or web address] to get updated schedule on who will round on this patient, as hospitalists switch teams weekly. If 7PM-7AM, please contact night-coverage at [email or web address], password [email or web address] for any overnight needs.  04/17/2016, 8:04 AM

## 2016-04-18 LAB — BASIC METABOLIC PANEL
Anion gap: 13 (ref 5–15)
BUN: 52 mg/dL — AB (ref 6–20)
CHLORIDE: 98 mmol/L — AB (ref 101–111)
CO2: 16 mmol/L — AB (ref 22–32)
CREATININE: 5.81 mg/dL — AB (ref 0.61–1.24)
Calcium: 6.7 mg/dL — ABNORMAL LOW (ref 8.9–10.3)
GFR calc Af Amer: 10 mL/min — ABNORMAL LOW (ref >60)
GFR calc non Af Amer: 8 mL/min — ABNORMAL LOW (ref >60)
GLUCOSE: 150 mg/dL — AB (ref 65–99)
POTASSIUM: 3.8 mmol/L (ref 3.5–5.1)
Sodium: 127 mmol/L — ABNORMAL LOW (ref 135–145)

## 2016-04-18 LAB — URINE CULTURE: Culture: NO GROWTH

## 2016-04-18 LAB — T3, FREE: T3, Free: 1.3 pg/mL — ABNORMAL LOW (ref 2.0–4.4)

## 2016-04-18 MED ORDER — SODIUM CHLORIDE 0.9 % IV SOLN
500.0000 mg | Freq: Two times a day (BID) | INTRAVENOUS | Status: AC
  Administered 2016-04-18 – 2016-04-21 (×6): 500 mg via INTRAVENOUS
  Filled 2016-04-18 (×7): qty 4

## 2016-04-18 MED ORDER — GABAPENTIN 100 MG PO CAPS
100.0000 mg | ORAL_CAPSULE | Freq: Every day | ORAL | Status: DC
  Administered 2016-04-19 – 2016-05-12 (×24): 100 mg via ORAL
  Filled 2016-04-18 (×24): qty 1

## 2016-04-18 MED ORDER — ATORVASTATIN CALCIUM 40 MG PO TABS
40.0000 mg | ORAL_TABLET | Freq: Every day | ORAL | Status: DC
  Administered 2016-04-19 – 2016-04-28 (×9): 40 mg via ORAL
  Filled 2016-04-18 (×11): qty 1

## 2016-04-18 MED ORDER — FUROSEMIDE 10 MG/ML IJ SOLN
40.0000 mg | Freq: Once | INTRAMUSCULAR | Status: DC
  Filled 2016-04-18: qty 4

## 2016-04-18 MED ORDER — FUROSEMIDE 10 MG/ML IJ SOLN
80.0000 mg | Freq: Three times a day (TID) | INTRAMUSCULAR | Status: DC
  Administered 2016-04-18 – 2016-04-20 (×6): 80 mg via INTRAVENOUS
  Filled 2016-04-18 (×7): qty 8

## 2016-04-18 NOTE — Progress Notes (Addendum)
[facility] PROGRESS NOTE    Progress Note  Nicholas Hudson  [id number] DOB: May 03, 1940 DOA: 04/16/2016 PCP: Nicholas Hams, MD     Brief Narrative:   Nicholas Hudson is an 76 y.o. male with medical history significant of PBH, gout, recurrent sinus infection, hypothyroidism. Patient with history of sinus, polyps  problems, infection for last 6 to 8 weeks, he has received 2 course of antibiotics by Dr Nicholas Hudson. Since 6 to 8 weeks ago he has been having headaches, less active, more sleepy/. Since 4 days ago he was  notice to be confuse, and lethargic by family.Mild leukocytosis worsening renal function and mild fever.  Assessment/Plan:   Sepsis of unclear source: Culture data negative till date. Repeated chest x-ray is negative start him on sepsis pathway, cont IV Vanco and Zosyn. MRI brain showed no abscess if infectious foci. Abd US showed hypoechoic mass versus confluent edema in the proximal pancreatic body measuring 1.5 x 1.1 cm. I will check MRI.  Metabolic encephalopathy: MRI is negative for infection, concern about infectious etiology. Now resolved.  AKI on chronic: Urinary sodium was 10, now with swelling of the feet. CR has not improved, concern about ATN. No recent drugs or contrast. Check a renal U/s. KVO IV fluids, D/w with Dr. Delfina Hudson, his Cr 1 year ago was 1.0 on 03/14/2016 was 3.0, attempts to reach him have been unsuccessful. 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 I will check a renal panel. I will go ahead and Start him lasix IV and see how he responds.  Abnormal transaminases Slight elevation of alkaline phosphatase, bilirubin AST and ALT are within normal limits. Abdominal ultrasound showed possible pancreatic mass. Check MRCP.  Hypothyroidism Continue Synthroid.  Hyponatremia KVO IV fluids.  Acute confusional state: Avoid BZD, use haldol PRN.  DVT prophylaxis: lovenox Family Communication:none Disposition Plan/Barrier to D/C: home in 4  days Code Status:     Code Status Orders        Start     Ordered   04/16/16 1839  Full code  Continuous     04/16/16 1838    Code Status History    Date Active Date Inactive Code Status Order ID Comments User Context   This patient has a current code status but no historical code status.        IV Access:    Peripheral IV   Procedures and diagnostic studies:   Dg Chest 2 View  Result Date: 04/16/2016 CLINICAL DATA:  Dyspnea and shortness of breath. EXAM: CHEST  2 VIEW COMPARISON:  None. FINDINGS: The cardiac silhouette, mediastinal and hilar contours are within normal limits. There is tortuosity and calcification of the thoracic aorta. There are streaky areas of bibasilar atelectasis or scarring but no infiltrates, edema or effusions. The bony thorax is intact. IMPRESSION: Streaky bibasilar scarring or atelectasis but no infiltrates, edema or effusions. Electronically Signed   By: Nicholas Hudson M.D.   On: 04/16/2016 16:36   [name] Contrast  Result Date: 04/17/2016 CLINICAL DATA:  76 year old male with progressive headache frequency for 6-8 weeks. Increased fatigue and lethargy. Increased confusion for 4 days. Mild fever. Worsening renal function. Initial encounter. EXAM: MRI HEAD WITHOUT CONTRAST TECHNIQUE: Multiplanar, multiecho pulse sequences of the brain and surrounding structures were obtained without intravenous contrast. COMPARISON:  Face CT 03/23/2016. FINDINGS: Brain: Cerebral volume loss stable from the recent face CT, appears fairly generalized except for some bilateral disproportionate perisylvian involvement. No restricted diffusion to  suggest acute infarction. No midline shift, mass effect, evidence of mass lesion, ventriculomegaly, extra-axial collection or acute intracranial hemorrhage. Cervicomedullary junction and pituitary are within normal limits. Pearline Cables and white matter signal is within normal limits for age throughout the brain. No cortical encephalomalacia  or chronic cerebral blood products. Vascular: Major intracranial vascular flow voids are preserved. Skull and upper cervical spine: Negative. Visualized bone marrow signal is within normal limits. Sinuses/Orbits: Widespread sinus and nasal cavity opacification not significantly changed from the recent face CT. 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 Right mastoid effusion appears increased, although the nasopharynx are it within normal limits. Left mastoids are clear. Other: Negative scalp soft tissues IMPRESSION: 1. No acute intracranial abnormality. Normal for age noncontrast. And aside from nonspecific perisylvian cerebral volume loss which may be mildly age advanced. 2. Continued widespread paranasal sinus disease as described by CT in January. 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 Probably related right mastoid effusion which appears increased since January. Electronically Signed   By: Nicholas Hudson M.D.   On: 04/17/2016 12:13   US Abdomen Complete  Result Date: 04/17/2016 CLINICAL DATA:  Abnormal transaminases. EXAM: ABDOMEN ULTRASOUND COMPLETE COMPARISON:  None. FINDINGS: Gallbladder: No gallstones or wall thickening visualized. No sonographic Murphy sign noted by sonographer. Incidental note made of a 3 mm gallbladder wall polyp. Common bile duct: Diameter: Normal at 4 mm. Liver: No focal lesion identified. Within normal limits in parenchymal echogenicity. IVC: No abnormality visualized. Pancreas: Heterogeneous echotexture, possible hypoechoic mass versus confluent edema within the pancreatic body measuring 1.5 x 1.1 cm. No peripancreatic free fluid identified. Spleen: Size and appearance within normal limits. Right Kidney: Length: 12.4 cm. Echogenicity within normal limits. No mass or hydronephrosis visualized. Left Kidney: Length: 13.1 cm. Echogenicity within normal limits. No mass or hydronephrosis visualized. Abdominal aorta: No aneurysm visualized. Distal aorta and aortic bifurcation obscured by  overlying bowel gas. Other findings: None. IMPRESSION: 1. Pancreas is heterogeneous in appearance, with possible hypoechoic mass versus confluent edema in the proximal pancreatic body measuring 1.5 x 1.1 cm. Recommend further characterization with pancreas MRI. 2. Remainder of the abdomen ultrasound is unremarkable, as detailed above. No evidence of cholecystitis. No bile duct dilatation. Electronically Signed   By: Franki Cabot M.D.   On: 04/17/2016 10:42   Dg Chest Port 1 View  Result Date: 04/17/2016 CLINICAL DATA:  76 y/o male code sepsis. Shortness of breath and headache. Initial encounter. EXAM: PORTABLE CHEST 1 VIEW COMPARISON:  04/16/2016 chest radiographs. FINDINGS: Portable AP semi upright view at at 0813 hours. Stable lung volumes. Stable mediastinal contours, normal aside from tortuosity of the thoracic aorta. Visualized tracheal air column is within normal limits. Allowing for portable technique the lungs are clear; minimal streaky opacity at the left lung base most resembles scarring. No pneumothorax or pulmonary edema. IMPRESSION: No acute cardiopulmonary abnormality. Electronically Signed   By: Nicholas Hudson M.D.   On: 04/17/2016 08:27     Medical Consultants:    None.  Anti-Infectives:   Vanc ans zosyn  Subjective:    Nicholas Hudson He relates he feels much better than yesterday.  Objective:    Vitals:   04/17/16 1450 04/17/16 2247 04/18/16 0650 04/18/16 0811  BP: 133/65 (!) 161/85 136/72   Pulse: 86 92 94   Resp: 20 20 20    Temp: 98.9 F (37.2 C) 100.1 F (37.8 C) 97.9 F (36.6 C) 99 F (37.2 C)  TempSrc: Oral Oral Oral Oral  SpO2: 99% 100% 98%   Weight:   91.5  kg (201 lb 11.5 oz)   Height:        Intake/Output Summary (Last 24 hours) at 04/18/16 1305 Last data filed at 04/18/16 0930  Gross per 24 hour  Intake             2520 ml  Output              425 ml  Net             2095 ml   Filed Weights   04/16/16 1838 04/17/16 0552 04/18/16 0650  Weight: 84.1  kg (185 lb 6.5 oz) 85.9 kg (189 lb 6 oz) 91.5 kg (201 lb 11.5 oz)    Exam: General exam: In no acute distress.asymetrical Face. Respiratory system: Good air movement and clear to auscultation. Cardiovascular system: S1 & S2 heard, RRR. No JVD. Gastrointestinal system: Abdomen is nondistended, soft and nontender.  Central nervous system: Alert and oriented. No focal neurological deficits.No nuchal rigidity Extremities: 2+ edema Skin: No rashes culture ulceration. Psychiatry: Judgement and insight appear normal. Mood & affect appropriate.    Data Reviewed:    Labs: Basic Metabolic Panel:  Recent Labs Lab 04/16/16 1535 04/16/16 2141 04/17/16 0509 04/17/16 0823  NA 126* 128* 129* 129*  K 5.7* 5.0 4.0 4.0  CL 93* 95* 96* 100*  CO2 20* 22 19* 19*  GLUCOSE 111* 127* 125* 115*  BUN 43* 44* 44* 45*  CREATININE 4.46* 4.84* 4.84* 4.66*  CALCIUM 7.9* 7.7* 7.4* 6.9*   GFR Estimated Creatinine Clearance: 13.8 mL/min (by C-G formula based on SCr of 4.66 mg/dL (H)). Liver Function Tests:  Recent Labs Lab 04/16/16 1535 04/17/16 0509 04/17/16 0823  AST 41 19 16  ALT 30 28 24   ALKPHOS 378* 336* 304*  BILITOT 1.2 0.8 0.8  PROT 6.6 6.6 5.7*  ALBUMIN 2.7* 2.4* 2.2*   No results for input(s): LIPASE, AMYLASE in the last 168 hours.  Recent Labs Lab 04/16/16 1900  AMMONIA 11   Coagulation profile  Recent Labs Lab 04/16/16 1615 04/17/16 0823  INR 1.21 1.29    CBC:  Recent Labs Lab 04/16/16 1535 04/17/16 0509 04/17/16 0823  WBC 8.4 10.7* 9.4  NEUTROABS 6.6  --  7.5  HGB 13.6 11.3* 10.2*  HCT 40.1 32.2* 30.0*  MCV 85.1 86.6 85.7  PLT 194 203 185   Cardiac Enzymes:  Recent Labs Lab 04/16/16 1537  CKTOTAL 92   BNP (last 3 results) No results for input(s): PROBNP in the last 8760 hours. CBG: No results for input(s): GLUCAP in the last 168 hours. D-Dimer: No results for input(s): DDIMER in the last 72 hours. Hgb A1c: No results for input(s): HGBA1C in  the last 72 hours. Lipid Profile: No results for input(s): CHOL, HDL, LDLCALC, TRIG, CHOLHDL, LDLDIRECT in the last 72 hours. Thyroid function studies:  Recent Labs  04/16/16 1906  TSH 5.396*  T3FREE 1.3*   Anemia work up: No results for input(s): VITAMINB12, FOLATE, FERRITIN, TIBC, IRON, RETICCTPCT in the last 72 hours. Sepsis Labs:  Recent Labs Lab 04/16/16 1535 04/16/16 1556 04/17/16 0509 04/17/16 0819 04/17/16 0823 04/17/16 1057  PROCALCITON  --   --   --   --  0.94  --   WBC 8.4  --  10.7*  --  9.4  --   LATICACIDVEN  --  1.47  --  0.8  --  0.7   Microbiology Recent Results (from the past 240 hour(s))  Culture, blood (Routine x 2)  Status: None (Preliminary result)   Collection Time: 04/16/16  3:32 PM  Result Value Ref Range Status   Specimen Description BLOOD RIGHT WRIST  Final   Special Requests BOTTLES DRAWN AEROBIC AND ANAEROBIC 5 CC EACH  Final   Culture   Final    NO GROWTH < 24 HOURS Performed at [facility], [place] [address], [place], [place] [address]    Report Status PENDING  Incomplete  Culture, blood (Routine x 2)     Status: None (Preliminary result)   Collection Time: 04/16/16  4:15 PM  Result Value Ref Range Status   Specimen Description BLOOD RIGHT HAND  Final   Special Requests BOTTLES DRAWN AEROBIC ONLY 5 CC  Final   Culture   Final    NO GROWTH < 24 HOURS Performed at [facility], [place] [address] [place], [place] [address]    Report Status PENDING  Incomplete  Urine culture     Status: None   Collection Time: 04/16/16  4:23 PM  Result Value Ref Range Status   Specimen Description URINE, RANDOM  Final   Special Requests NONE  Final   Culture   Final    NO GROWTH Performed at [facility], [place] [address], [place],  [address]    Report Status 04/18/2016 FINAL  Final     Medications:   . aspirin EC  81 mg Oral Daily  . finasteride  5 mg Oral Daily  . gabapentin  300 mg Oral QHS  . heparin  5,000 Units  Subcutaneous Q8H  . levothyroxine  200 mcg Oral QAC breakfast  . piperacillin-tazobactam (ZOSYN)  IV  2.25 g Intravenous Q8H  . pneumococcal 23 valent vaccine  0.5 mL Intramuscular Tomorrow-1000  . rosuvastatin  20 mg Oral Daily  . senna  1 tablet Oral BID  . [START ON 04/19/2016] vancomycin  1,000 mg Intravenous Q48H   Continuous Infusions: . sodium chloride 100 mL/hr at 04/17/16 0300    Time spent: 35 min   LOS: 2 days   Charlynne Cousins  [facility] Pager [phone]  *Please refer to [email or web address], password [email or web address] to get updated schedule on who will round on this patient, as hospitalists switch teams weekly. If 7PM-7AM, please contact night-coverage at [email or web address], password [email or web address] for any overnight needs.  04/18/2016, 1:05 PM

## 2016-04-18 NOTE — Consult Note (Addendum)
Renal Service Consult Note [facility]  Nicholas Hudson 04/18/2016 Roney Jaffe D Requesting Physician:  Dr Aileen Fass  Reason for Consult:  Renal failure HPI: The patient is a 76 y.o. year-old with history of BPH, gout and hypothyroidism who developed "sinus infections" in Dec 2017. He took a course of abx and prednisone, but symptoms persisted. He took another course in January with improvement.  Over the last 4-8 wks he has been more sleepy, inactive, swollen LE"s and HA's.  Last 4 d has been off and on confused per family.  Poor po intake. Had fever 100.1 at home.  Has known enlarge prostate with some voiding difficulty which is not new.  No n/v/d.  In ED was found to have creat 4.  According to pt's PCP in the notes thepatient had creat of 1.0 one year ago, and on Mar 14, 2016 his creat was up to 3.0. PCP was unable to reach pt after the 3.0 creatinine.    Patient denies old hx kidney problems. Urine "stops and starts", taking prostate shrinking medication.  No hematuria. Has been using ibuprofen for the last 6-8 wks since he started to have some joint achiness.   ROS  denies CP  no joint pain   no HA  no blurry vision  no rash  no diarrhea  no nausea/ vomiting  no dysuria  no difficulty voiding  no change in urine color    Past Medical History  Past Medical History:  Diagnosis Date  . BPH (benign prostatic hyperplasia)   . Gout   . Hyperlipidemia   . Renal disorder   . Thyroid disease    Past Surgical History History reviewed. No pertinent surgical history. Family History History reviewed. No pertinent family history. Social History  reports that he has never smoked. He has never used smokeless tobacco. He reports that he does not drink alcohol. His drug history is not on file. Allergies No Known Allergies Home medications Prior to Admission medications   Medication Sig Start Date End Date Taking? Authorizing Provider  allopurinol (ZYLOPRIM) 100 MG  tablet Take 1 tablet (100 mg total) by mouth daily. 05/28/14  Yes Wallene Huh, DPM  aspirin EC 81 MG tablet Take 81 mg by mouth daily.   Yes Historical Provider, MD  cholecalciferol (VITAMIN D) 1000 UNITS tablet Take 2,000 Units by mouth daily.   Yes Historical Provider, MD  cyanocobalamin 2000 MCG tablet Take 2,000 mcg by mouth daily.   Yes Historical Provider, MD  finasteride (PROSCAR) 5 MG tablet Take 5 mg by mouth daily.   Yes Historical Provider, MD  gabapentin (NEURONTIN) 300 MG capsule Take 300 mg by mouth at bedtime. 04/14/16  Yes Historical Provider, MD  levothyroxine (SYNTHROID, LEVOTHROID) 200 MCG tablet Take 200 mcg by mouth daily before breakfast.  04/16/16  Yes Historical Provider, MD  Omega-3 Fatty Acids (FISH OIL) 1000 MG CAPS Take 1,000 mg by mouth daily.    Yes Historical Provider, MD  rosuvastatin (CRESTOR) 20 MG tablet Take 20 mg by mouth daily.   Yes Historical Provider, MD   Liver Function Tests  Recent Labs Lab 04/16/16 1535 04/17/16 0509 04/17/16 0823  AST 41 19 16  ALT 30 28 24   ALKPHOS 378* 336* 304*  BILITOT 1.2 0.8 0.8  PROT 6.6 6.6 5.7*  ALBUMIN 2.7* 2.4* 2.2*   No results for input(s): LIPASE, AMYLASE in the last 168 hours. CBC  Recent Labs Lab 04/16/16 1535 04/17/16 0509 04/17/16 0823  WBC  8.4 10.7* 9.4  NEUTROABS 6.6  --  7.5  HGB 13.6 11.3* 10.2*  HCT 40.1 32.2* 30.0*  MCV 85.1 86.6 85.7  PLT 194 203 [id number]   Basic Metabolic Panel  Recent Labs Lab 04/16/16 1535 04/16/16 2141 04/17/16 0509 04/17/16 0823 04/18/16 1412  NA 126* 128* 129* 129* 127*  K 5.7* 5.0 4.0 4.0 3.8  CL 93* 95* 96* 100* 98*  CO2 20* 22 19* 19* 16*  GLUCOSE 111* 127* 125* 115* 150*  BUN 43* 44* 44* 45* 52*  CREATININE 4.46* 4.84* 4.84* 4.66* 5.81*  CALCIUM 7.9* 7.7* 7.4* 6.9* 6.7*   Iron/TIBC/Ferritin/ %Sat No results found for: IRON, TIBC, FERRITIN, IRONPCTSAT  Vitals:   04/17/16 2247 04/18/16 0650 04/18/16 0811 04/18/16 1429  BP: (!) 161/85 136/72  125/67   Pulse: 92 94  74  Resp: 20 20  20   Temp: 100.1 F (37.8 C) 97.9 F (36.6 C) 99 F (37.2 C) 98.5 F (36.9 C)  TempSrc: Oral Oral Oral Oral  SpO2: 100% 98%  99%  Weight:  91.5 kg (201 lb 11.5 oz)    Height:       Exam Gen alert, obese, pleasant Nicholas Hudson in chair, nad No rash, cyanosis or gangrene Sclera anicteric, throat clear  +JVD to angle of jaw Chest clear bilat to bases RRR distant HS, no mrg Abd soft ntnd no mass or ascites +bs obese and tympanitic GU normal male MS no joint effusions or deformity Ext 2+ bilat pretib edema / no wounds or ulcers Neuro is alert, Ox 3 , nf  Urine (by undersigned) > clear , yellow, 2+ blood and protein, 0-5 rbc/ wbc's per HPF.  Numerous granular casts, numerous degenerating cellular casts, +rbc casts and mixed rbc/ wbc casts.   Abd [place] > 13 cm kidneys, no hydro  Assessment: 1.  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 In setting of sinus disease ANCA related GN would be expected.  Also is vol overloaded with sig LE edema , JVD but no resp involvement. Recommend: IV bolus steroids, hold IVF", IV lasix trial, set up patient for renal biopsy, dc'd IV vanc and ordered several serologies and explained plan to pateint who agrees to proceed.  2.  Hypothyroidism 3.  Gout 4.  BPH - no sign of hydro on [place]    Plan - as above  Kelly Splinter MD [employer] pager [phone]   04/18/2016, 4:32 PM

## 2016-04-19 ENCOUNTER — Inpatient Hospital Stay (HOSPITAL_COMMUNITY): Payer: Medicare Other

## 2016-04-19 ENCOUNTER — Encounter (HOSPITAL_COMMUNITY): Payer: Self-pay | Admitting: Radiology

## 2016-04-19 DIAGNOSIS — I509 Heart failure, unspecified: Secondary | ICD-10-CM

## 2016-04-19 LAB — MULTIPLE MYELOMA PANEL, SERUM
ALBUMIN/GLOB SERPL: 0.7 (ref 0.7–1.7)
ALPHA2 GLOB SERPL ELPH-MCNC: 1.2 g/dL — AB (ref 0.4–1.0)
Albumin SerPl Elph-Mcnc: 2.2 g/dL — ABNORMAL LOW (ref 2.9–4.4)
Alpha 1: 0.5 g/dL — ABNORMAL HIGH (ref 0.0–0.4)
B-GLOBULIN SERPL ELPH-MCNC: 1.1 g/dL (ref 0.7–1.3)
GAMMA GLOB SERPL ELPH-MCNC: 0.8 g/dL (ref 0.4–1.8)
GLOBULIN, TOTAL: 3.6 g/dL (ref 2.2–3.9)
IGG (IMMUNOGLOBIN G), SERUM: 838 mg/dL (ref 700–1600)
IgA: 262 mg/dL (ref 61–437)
IgM, Serum: 39 mg/dL (ref 15–143)
Total Protein ELP: 5.8 g/dL — ABNORMAL LOW (ref 6.0–8.5)

## 2016-04-19 LAB — RENAL FUNCTION PANEL
ANION GAP: 16 — AB (ref 5–15)
Albumin: 2.1 g/dL — ABNORMAL LOW (ref 3.5–5.0)
BUN: 63 mg/dL — ABNORMAL HIGH (ref 6–20)
CHLORIDE: 97 mmol/L — AB (ref 101–111)
CO2: 14 mmol/L — AB (ref 22–32)
CREATININE: 6.29 mg/dL — AB (ref 0.61–1.24)
Calcium: 7 mg/dL — ABNORMAL LOW (ref 8.9–10.3)
GFR calc Af Amer: 9 mL/min — ABNORMAL LOW (ref >60)
GFR calc non Af Amer: 8 mL/min — ABNORMAL LOW (ref >60)
Glucose, Bld: 272 mg/dL — ABNORMAL HIGH (ref 65–99)
POTASSIUM: 4 mmol/L (ref 3.5–5.1)
Phosphorus: 8.9 mg/dL — ABNORMAL HIGH (ref 2.5–4.6)
Sodium: 127 mmol/L — ABNORMAL LOW (ref 135–145)

## 2016-04-19 LAB — GLUCOSE, CAPILLARY
GLUCOSE-CAPILLARY: 252 mg/dL — AB (ref 65–99)
GLUCOSE-CAPILLARY: 244 mg/dL — AB (ref 65–99)
Glucose-Capillary: 333 mg/dL — ABNORMAL HIGH (ref 65–99)

## 2016-04-19 LAB — ECHOCARDIOGRAM COMPLETE
Height: 65 in
Weight: 3251.2 oz

## 2016-04-19 MED ORDER — INSULIN DETEMIR 100 UNIT/ML ~~LOC~~ SOLN: 20.0000 [IU] | Freq: Two times a day (BID) | SUBCUTANEOUS | Status: DC

## 2016-04-19 MED ORDER — INSULIN ASPART 100 UNIT/ML ~~LOC~~ SOLN
0.0000 [IU] | Freq: Three times a day (TID) | SUBCUTANEOUS | Status: DC
  Administered 2016-04-19: 11 [IU] via SUBCUTANEOUS
  Administered 2016-04-19: 8 [IU] via SUBCUTANEOUS
  Administered 2016-04-20: 3 [IU] via SUBCUTANEOUS
  Administered 2016-04-20: 2 [IU] via SUBCUTANEOUS
  Administered 2016-04-20 – 2016-04-22 (×4): 3 [IU] via SUBCUTANEOUS
  Administered 2016-04-23: 5 [IU] via SUBCUTANEOUS
  Administered 2016-04-24 – 2016-04-25 (×4): 3 [IU] via SUBCUTANEOUS
  Administered 2016-04-26: 5 [IU] via SUBCUTANEOUS
  Administered 2016-04-26: 3 [IU] via SUBCUTANEOUS
  Administered 2016-04-27 (×2): 2 [IU] via SUBCUTANEOUS
  Administered 2016-04-28: 5 [IU] via SUBCUTANEOUS
  Administered 2016-04-28: 3 [IU] via SUBCUTANEOUS
  Administered 2016-04-29: 5 [IU] via SUBCUTANEOUS
  Administered 2016-04-29: 3 [IU] via SUBCUTANEOUS
  Administered 2016-04-29: 2 [IU] via SUBCUTANEOUS
  Administered 2016-05-01: 3 [IU] via SUBCUTANEOUS
  Administered 2016-05-02 – 2016-05-04 (×4): 2 [IU] via SUBCUTANEOUS
  Administered 2016-05-05 – 2016-05-06 (×2): 3 [IU] via SUBCUTANEOUS
  Administered 2016-05-06: 2 [IU] via SUBCUTANEOUS
  Administered 2016-05-07: 5 [IU] via SUBCUTANEOUS
  Administered 2016-05-08: 3 [IU] via SUBCUTANEOUS
  Administered 2016-05-08 – 2016-05-09 (×2): 2 [IU] via SUBCUTANEOUS
  Administered 2016-05-09: 5 [IU] via SUBCUTANEOUS
  Administered 2016-05-10 (×2): 2 [IU] via SUBCUTANEOUS
  Administered 2016-05-11 (×2): 3 [IU] via SUBCUTANEOUS
  Administered 2016-05-12: 2 [IU] via SUBCUTANEOUS

## 2016-04-19 MED ORDER — INSULIN ASPART 100 UNIT/ML ~~LOC~~ SOLN
0.0000 [IU] | Freq: Every day | SUBCUTANEOUS | Status: DC
  Administered 2016-04-19 – 2016-04-21 (×2): 2 [IU] via SUBCUTANEOUS

## 2016-04-19 MED ORDER — INSULIN DETEMIR 100 UNIT/ML ~~LOC~~ SOLN
10.0000 [IU] | Freq: Two times a day (BID) | SUBCUTANEOUS | Status: DC
  Administered 2016-04-19 – 2016-04-22 (×7): 10 [IU] via SUBCUTANEOUS
  Filled 2016-04-19 (×9): qty 0.1

## 2016-04-19 MED ORDER — INSULIN ASPART 100 UNIT/ML ~~LOC~~ SOLN
4.0000 [IU] | Freq: Three times a day (TID) | SUBCUTANEOUS | Status: DC
  Administered 2016-04-19 – 2016-04-24 (×8): 4 [IU] via SUBCUTANEOUS

## 2016-04-19 NOTE — Progress Notes (Signed)
[facility] Progress Note   Subjective: creat up, poor UOP, no SOB or n/v/d, no cough  Vitals:   04/18/16 0811 04/18/16 1429 04/18/16 2156 04/19/16 0543  BP:  125/67 (!) 142/91 131/79  Pulse:  74 85 78  Resp:  20 18 16   Temp: 99 F (37.2 C) 98.5 F (36.9 C) 98.3 F (36.8 C) 97.5 F (36.4 C)  TempSrc: Oral Oral Oral Oral  SpO2:  99% 98% 98%  Weight:    92.2 kg (203 lb 3.2 oz)  Height:        Inpatient medications: . aspirin EC  81 mg Oral Daily  . atorvastatin  40 mg Oral q1800  . finasteride  5 mg Oral Daily  . furosemide  80 mg Intravenous Q8H  . gabapentin  100 mg Oral QHS  . heparin  5,000 Units Subcutaneous Q8H  . levothyroxine  200 mcg Oral QAC breakfast  . methylPREDNISolone (SOLU-MEDROL) injection  500 mg Intravenous Q12H  . piperacillin-tazobactam (ZOSYN)  IV  2.25 g Intravenous Q8H  . pneumococcal 23 valent vaccine  0.5 mL Intramuscular Tomorrow-1000  . senna  1 tablet Oral BID    acetaminophen **OR** acetaminophen, ondansetron **OR** ondansetron (ZOFRAN) IV, traMADol, zolpidem  Exam: Gen alert, obese, up in chair +JVD Chest clear bilat to bases RRR distant HS, no mrg Abd soft ntnd no mass or ascites +bs obese and tympanitic Ext 2+ bilat pitting pretib edema Neuro is alert, Ox 3 , nf, no asterixis  Urine (by undersigned) > clear , yellow, 2+ blood and protein, 0-5 rbc/ wbc's per HPF.  Numerous granular casts, numerous degenerating cellular casts, +rbc casts and mixed rbc/ wbc casts.   Abd [place] > 13 cm kidneys, no hydro  Assessment: 1.  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 In setting of sinus disease ANCA-related GN would be expected.  Also is vol overloaded with sig LE edema.  Poor UOP in spite of high-dose IV lasix, creat rising.  Expect will need HD in the next few days.  Will need transfer to [facility].  Getting bolus IV steroids x 3 days, day #2. Needs  renal biopsy, have consulted IR for this.   3.  Gout 4.  BPH - no sign of hydro on [place]   Plan - as above   Kelly Splinter MD [employer] pager [phone]   04/19/2016, 9:05 AM    Recent Labs Lab 04/17/16 0823 04/18/16 1412 04/19/16 0512  NA 129* 127* 127*  K 4.0 3.8 4.0  CL 100* 98* 97*  CO2 19* 16* 14*  GLUCOSE 115* 150* 272*  BUN 45* 52* 63*  CREATININE 4.66* 5.81* 6.29*  CALCIUM 6.9* 6.7* 7.0*  PHOS  --   --  8.9*    Recent Labs Lab 04/16/16 1535 04/17/16 0509 04/17/16 0823 04/19/16 0512  AST 41 19 16  --   ALT 30 28 24   --   ALKPHOS 378* 336* 304*  --   BILITOT 1.2 0.8 0.8  --   PROT 6.6 6.6 5.7*  --   ALBUMIN 2.7* 2.4* 2.2* 2.1*    Recent Labs Lab 04/16/16 1535 04/17/16 0509 04/17/16 0823  WBC 8.4 10.7* 9.4  NEUTROABS 6.6  --  7.5  HGB 13.6 11.3* 10.2*  HCT 40.1 32.2* 30.0*  MCV 85.1 86.6 85.7  PLT 194 203 185   Iron/TIBC/Ferritin/ %Sat No results found for: IRON, TIBC, FERRITIN,  IRONPCTSAT

## 2016-04-19 NOTE — Progress Notes (Signed)
  Echocardiogram 2D Echocardiogram has been performed.  Nicholas Hudson 04/19/2016, 3:17 PM

## 2016-04-19 NOTE — Progress Notes (Signed)
RT note:  Patient has home CPAP unit.  Unit is noticeably older (patient states 20+ years).  Cord is in tact with no visible frays.  Unit does turn on.  RT explained to patient that we cannot be responsible for home CPAP unit while in hospital.  Patient was offered a hospital CPAP machine but decline.

## 2016-04-19 NOTE — Progress Notes (Addendum)
Inpatient Diabetes Program Recommendations  AACE/ADA: New Consensus Statement on Inpatient Glycemic Control (2015)  Target Ranges:  Prepandial:   less than 140 mg/dL      Peak postprandial:   less than 180 mg/dL (1-2 hours)      Critically ill patients:  140 - 180 mg/dL   Review of Glycemic Control  Diabetes history: None Current orders for Inpatient glycemic control: None  Inpatient Diabetes Program Recommendations:   Lab glucose 272 mg/dl at 0512 this am. Patient receiving IV Solumedrol 500 mg Q12hours. Received 2 doses. Please consider CBGs and possibly Novolog Sensitive Correction Q4hours.  Text paged Dr. Aileen Fass at 1136 am inquiring if correction scale is appropriate for pt.  Thanks,  Tama Headings RN, MSN, [facility] Inpatient Diabetes Coordinator Team Pager [phone] (8a-5p)

## 2016-04-19 NOTE — Progress Notes (Signed)
Chief Complaint: Patient was seen in consultation today for renal biopsy at the request of Dr. Roney Jaffe  Referring Physician(s): Dr. Roney Jaffe  Supervising Physician: Arne Cleveland  Patient Status: [name] - In-pt  History of Present Illness: Nicholas Hudson is a 76 y.o. male with progressive renal failure. He is undergoing extensive workup and IR is asked to do random renal biopsy. He is to be transferred to [facility] for continued renal care. Chart, PMHx, meds, labs, imaging reviewed. Son at bedside.  Past Medical History:  Diagnosis Date  . BPH (benign prostatic hyperplasia)   . Gout   . Hyperlipidemia   . Renal disorder   . Thyroid disease     History reviewed. No pertinent surgical history.  Allergies: Patient has no known allergies.  Medications:  Current Facility-Administered Medications:  .  acetaminophen (TYLENOL) tablet 650 mg, 650 mg, Oral, Q6H PRN, 650 mg at 04/18/16 0814 **OR** acetaminophen (TYLENOL) suppository 650 mg, 650 mg, Rectal, Q6H PRN, Belkys A Regalado, MD .  aspirin EC tablet 81 mg, 81 mg, Oral, Daily, Belkys A Regalado, MD, 81 mg at 04/18/16 1042 .  atorvastatin (LIPITOR) tablet 40 mg, 40 mg, Oral, q1800, Charlynne Cousins, MD .  finasteride (PROSCAR) tablet 5 mg, 5 mg, Oral, Daily, Belkys A Regalado, MD, 5 mg at 04/18/16 1042 .  furosemide (LASIX) injection 80 mg, 80 mg, Intravenous, Q8H, Roney Jaffe, MD, 80 mg at 04/19/16 0816 .  gabapentin (NEURONTIN) capsule 100 mg, 100 mg, Oral, QHS, Charlynne Cousins, MD .  heparin injection 5,000 Units, 5,000 Units, Subcutaneous, Q8H, Elmarie Shiley, MD, 5,000 Units at 04/19/16 0540 .  levothyroxine (SYNTHROID, LEVOTHROID) tablet 200 mcg, 200 mcg, Oral, QAC breakfast, Belkys A Regalado, MD, 200 mcg at 04/19/16 0816 .  methylPREDNISolone sodium succinate (SOLU-MEDROL) 500 mg in sodium chloride 0.9 % 50 mL IVPB, 500 mg, Intravenous, Q12H, Roney Jaffe, MD, 500 mg at 04/19/16 0540 .   ondansetron (ZOFRAN) tablet 4 mg, 4 mg, Oral, Q6H PRN **OR** ondansetron (ZOFRAN) injection 4 mg, 4 mg, Intravenous, Q6H PRN, Belkys A Regalado, MD, 4 mg at 04/16/16 1902 .  piperacillin-tazobactam (ZOSYN) IVPB 2.25 g, 2.25 g, Intravenous, Q8H, Emiliano Dyer, RPH, 2.25 g at 04/19/16 0540 .  pneumococcal 23 valent vaccine (PNU-IMMUNE) injection 0.5 mL, 0.5 mL, Intramuscular, Tomorrow-1000, Belkys A Regalado, MD .  senna (SENOKOT) tablet 8.6 mg, 1 tablet, Oral, BID, Belkys A Regalado, MD, 8.6 mg at 04/18/16 2157 .  traMADol (ULTRAM) tablet 50 mg, 50 mg, Oral, Q6H PRN, Belkys A Regalado, MD, 50 mg at 04/18/16 2157 .  zolpidem (AMBIEN) tablet 5 mg, 5 mg, Oral, QHS PRN, Jani Gravel, MD, 5 mg at 04/18/16 2157    History reviewed. No pertinent family history.  Social History   Social History  . Marital status: Married    Spouse name: [name]  . Number of children: [name]  . Years of education: [name]   Social History Main Topics  . Smoking status: Never Smoker  . Smokeless tobacco: Never Used  . Alcohol use No  . Drug use: Unknown  . Sexual activity: Not Asked   Other Topics Concern  . None   Social History Narrative  . None     Review of Systems: A 12 point ROS discussed and pertinent positives are indicated in the HPI above.  All other systems are negative.  Review of Systems  Vital Signs: BP 131/79 (BP Location: Right Arm)   Pulse 78  Temp 97.5 F (36.4 C) (Oral)   Resp 16   Ht 5\' 5"  (1.651 m)   Wt 203 lb 3.2 oz (92.2 kg)   SpO2 98%   BMI 33.81 kg/m   Physical Exam  Constitutional: He is oriented to person, place, and time. He appears well-developed and well-nourished. No distress.  HENT:  Head: Normocephalic.  Mouth/Throat: Oropharynx is clear and moist.  Neck: Normal range of motion. No JVD present.  Cardiovascular: Normal rate, regular rhythm and normal heart sounds.   Pulmonary/Chest: Effort normal and breath sounds normal. No respiratory distress.  Abdominal: Soft.  He exhibits no mass. There is no tenderness.  Neurological: He is alert and oriented to person, place, and time. No cranial nerve deficit.  Skin: Skin is warm and dry.  Psychiatric: He has a normal mood and affect. Judgment normal.     Mallampati Score:  MD Evaluation Airway: WNL Heart: WNL Abdomen: WNL Chest/ Lungs: WNL ASA  Classification: 3 Mallampati/Airway Score: Two  Imaging: Dg Chest 2 View  Result Date: 04/16/2016 CLINICAL DATA:  Dyspnea and shortness of breath. EXAM: CHEST  2 VIEW COMPARISON:  None. FINDINGS: The cardiac silhouette, mediastinal and hilar contours are within normal limits. There is tortuosity and calcification of the thoracic aorta. There are streaky areas of bibasilar atelectasis or scarring but no infiltrates, edema or effusions. The bony thorax is intact. IMPRESSION: Streaky bibasilar scarring or atelectasis but no infiltrates, edema or effusions. Electronically Signed   By: [name] M.D.   On: 04/16/2016 16:36   [name] Contrast  Result Date: 04/17/2016 CLINICAL DATA:  76 year old male with progressive headache frequency for 6-8 weeks. Increased fatigue and lethargy. Increased confusion for 4 days. Mild fever. Worsening renal function. Initial encounter. EXAM: MRI HEAD WITHOUT CONTRAST TECHNIQUE: Multiplanar, multiecho pulse sequences of the brain and surrounding structures were obtained without intravenous contrast. COMPARISON:  Face CT 03/23/2016. FINDINGS: Brain: Cerebral volume loss stable from the recent face CT, appears fairly generalized except for some bilateral disproportionate perisylvian involvement. No restricted diffusion to suggest acute infarction. No midline shift, mass effect, evidence of mass lesion, ventriculomegaly, extra-axial collection or acute intracranial hemorrhage. Cervicomedullary junction and pituitary are within normal limits. Pearline Cables and white matter signal is within normal limits for age throughout the brain. No cortical  encephalomalacia or chronic cerebral blood products. Vascular: Major intracranial vascular flow voids are preserved. Skull and upper cervical spine: Negative. Visualized bone marrow signal is within normal limits. Sinuses/Orbits: Widespread sinus and nasal cavity opacification not significantly changed from the recent face CT. 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 Right mastoid effusion appears increased, although the nasopharynx are it within normal limits. Left mastoids are clear. Other: Negative scalp soft tissues IMPRESSION: 1. No acute intracranial abnormality. Normal for age noncontrast. And aside from nonspecific perisylvian cerebral volume loss which may be mildly age advanced. 2. Continued widespread paranasal sinus disease as described by CT in January. 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 Probably related right mastoid effusion which appears increased since January. Electronically Signed   By: Genevie Ann M.D.   On: 04/17/2016 12:13   US Abdomen Complete  Result Date: 04/17/2016 CLINICAL DATA:  Abnormal transaminases. EXAM: ABDOMEN ULTRASOUND COMPLETE COMPARISON:  None. FINDINGS: Gallbladder: No gallstones or wall thickening visualized. No sonographic Murphy sign noted by sonographer. Incidental note made of a 3 mm gallbladder wall polyp. Common bile duct: Diameter: Normal at 4 mm. Liver: No focal lesion identified. Within normal limits in parenchymal echogenicity. IVC: No abnormality visualized. Pancreas: Heterogeneous  echotexture, possible hypoechoic mass versus confluent edema within the pancreatic body measuring 1.5 x 1.1 cm. No peripancreatic free fluid identified. Spleen: Size and appearance within normal limits. Right Kidney: Length: 12.4 cm. Echogenicity within normal limits. No mass or hydronephrosis visualized. Left Kidney: Length: 13.1 cm. Echogenicity within normal limits. No mass or hydronephrosis visualized. Abdominal aorta: No aneurysm visualized. Distal aorta and aortic bifurcation  obscured by overlying bowel gas. Other findings: None. IMPRESSION: 1. Pancreas is heterogeneous in appearance, with possible hypoechoic mass versus confluent edema in the proximal pancreatic body measuring 1.5 x 1.1 cm. Recommend further characterization with pancreas MRI. 2. Remainder of the abdomen ultrasound is unremarkable, as detailed above. No evidence of cholecystitis. No bile duct dilatation. Electronically Signed   By: Franki Cabot M.D.   On: 04/17/2016 10:42   Dg Chest Port 1 View  Result Date: 04/17/2016 CLINICAL DATA:  75 y/o male code sepsis. Shortness of breath and headache. Initial encounter. EXAM: PORTABLE CHEST 1 VIEW COMPARISON:  04/16/2016 chest radiographs. FINDINGS: Portable AP semi upright view at at 0813 hours. Stable lung volumes. Stable mediastinal contours, normal aside from tortuosity of the thoracic aorta. Visualized tracheal air column is within normal limits. Allowing for portable technique the lungs are clear; minimal streaky opacity at the left lung base most resembles scarring. No pneumothorax or pulmonary edema. IMPRESSION: No acute cardiopulmonary abnormality. Electronically Signed   By: Genevie Ann M.D.   On: 04/17/2016 08:27   Ct Maxillofacial Wo Contrast  Result Date: 03/23/2016 CLINICAL DATA:  76 y/o M; congestion and nasal sinuses, polyps, pre upper surgery, headaches for 1 month. EXAM: CT MAXILLOFACIAL WITHOUT CONTRAST TECHNIQUE: Multidetector CT imaging of the maxillofacial structures was performed. Multiplanar CT image reconstructions were also generated. A small metallic BB was placed on the right temple in order to reliably differentiate right from left. COMPARISON:  None. FINDINGS: Frontal sinus: Moderate mucosal thickening and partial opacification on the right with a small fluid level. Opacified frontal sinus drainage pathways bilaterally. Ethmoid sinus: Near complete opacification with some pneumatization of left anterior ethmoid air cells. Maxillary sinuses:  Complete opacification of the right and moderate mucosal thickening of the left with a large mucous retention cyst. Chronic inflammatory changes of the walls of maxillary sinuses. Increased attenuation of right maxillary sinus opacification may represent inspissation or fungal elements. Sphenoid sinus: Near complete opacification with small fluid level and chronic inflammatory changes of the walls of the sinuses. A bony septum inserts on the right carotid canal. Minor pneumatization of the right lateral recess. Right ostiomeatal unit:  Opacified.  Small Haller cell. Left ostiomeatal unit:  Opacified. Nasal passages: Polypoid mucosal thickening of middle and inferior nasal turbinates. Lesion centered within the left middle meatus extending into left nasal passage measuring 16 x 10 x 19 mm (AP x ML x CC) series 3, image 106 and series 601 image 32, probably representing a large polyp or cyst. Intact nasal septum with mild rightward deviation. Opacified right and partially opacified left concha bullosa. Anatomy: Opacified right-sided frontal sinus and pneumatized left-sided frontal sinus superior to the anterior ethmoid notches bilaterally. Symmetric olfactory grooves and fovea ethmoidalis, Keros 3. Sellar sphenoid pneumatization pattern. No dehiscence of carotid or optic canals is identified. Other: Left basal ganglia chronic lacunar infarct and mild volume loss of the brain. Trace right mastoid effusion. IMPRESSION: 1. 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 Sinus drainage pathways are opacified. 2. Polypoid mucosal  thickening of nasal passages including a large probable polyp/cyst within the left middle meatus measuring up to 19 mm. 3. Trace right mastoid effusion. Electronically Signed   By: Kristine Garbe M.D.   On: 03/23/2016 15:51    Labs:  CBC:  Recent Labs  04/16/16 1535 04/17/16 0509  04/17/16 0823  WBC 8.4 10.7* 9.4  HGB 13.6 11.3* 10.2*  HCT 40.1 32.2* 30.0*  PLT 194 203 185    COAGS:  Recent Labs  04/16/16 1615 04/17/16 0823  INR 1.21 1.29  APTT  --  39*    BMP:  Recent Labs  04/17/16 0509 04/17/16 0823 04/18/16 1412 04/19/16 0512  NA 129* 129* 127* 127*  K 4.0 4.0 3.8 4.0  CL 96* 100* 98* 97*  CO2 19* 19* 16* 14*  GLUCOSE 125* 115* 150* 272*  BUN 44* 45* 52* 63*  CALCIUM 7.4* 6.9* 6.7* 7.0*  CREATININE 4.84* 4.66* 5.81* 6.29*  GFRNONAA 10* 11* 8* 8*  GFRAA 12* 13* 10* 9*    LIVER FUNCTION TESTS:  Recent Labs  04/16/16 1535 04/17/16 0509 04/17/16 0823 04/19/16 0512  BILITOT 1.2 0.8 0.8  --   AST 41 19 16  --   ALT 30 28 24   --   ALKPHOS 378* 336* 304*  --   PROT 6.6 6.6 5.7*  --   ALBUMIN 2.7* 2.4* 2.2* 2.1*    TUMOR MARKERS: No results for input(s): AFPTM, CEA, CA199, CHROMGRNA in the last 8760 hours.  Assessment and Plan: Progressive renal failure. Needs renal biopsy To be transferred to [facility] for continued Care. Discussed plan for procedure tomorrow to be done at [facility] hospital. NPO p MN and hold tonight's and tomorrow's heparin until after procedure. Risks and Benefits discussed with the patient including, but not limited to bleeding, infection, damage to adjacent structures or low yield requiring additional tests. All of the patient's questions were answered, patient is agreeable to proceed. Consent signed and in chart.    Thank you for this interesting consult.  I greatly enjoyed meeting PATON SHAMPINE and look forward to participating in their care.  A copy of this report was sent to the requesting provider on this date.  Electronically Signed: Ascencion Dike 04/19/2016, 11:03 AM   I spent a total of 20 minutes in face to face in clinical consultation, greater than 50% of which was counseling/coordinating care for random renal biopsy

## 2016-04-19 NOTE — Progress Notes (Addendum)
[facility] PROGRESS NOTE    Progress Note  Nicholas Hudson  [id number] DOB: 03-Jan-1941 DOA: 04/16/2016 PCP: Kandice Hams, MD     Brief Narrative:   Nicholas Hudson is an 76 y.o. male with medical history significant of PBH, gout, recurrent sinus infection, hypothyroidism. Patient with history of sinus, polyps  problems, infection for last 6 to 8 weeks, he has received 2 course of antibiotics by Dr Lucia Gaskins. Since 6 to 8 weeks ago he has been having headaches, less active, more sleepy/. Since 4 days ago he was  notice to be confuse, and lethargic by family.Mild leukocytosis worsening renal function and mild fever.  Assessment/Plan:   Sepsis of unclear source: Culture data negative till date. Repeated chest x-ray is negative start him on sepsis pathway, Vancomycin was DC'd and continue Zosyn. MRI brain showed no abscess if infectious foci. We will have a low threshold to DC Zosyn.  Metabolic encephalopathy: MRI is negative for infection, concern about infectious etiology. Now resolved.  AKI on chronic: Urinary sodium was 10, now with swelling of the feet. Cr. has not improved, Concern about ATN. No recent drugs or contrast. Complete abd. [place] no hydronephrosis. KVO IV fluids, D/w with Dr. Delfina Redwood, his Cr 1 year ago was 1.0 We have consulted nephrology recommended serology, IV steroids, IV Lasix, renal  Biopsy and HD. Also recommended to DC IV vancomycin, they are concerned about glomerulonephritis with nephritic sediment with rapidly rising creatinine over the last 1-2 months. Transfer to [facility] for possible hemodialysis.  Abnormal transaminases Slight elevation of alkaline phosphatase, bilirubin AST and ALT are within normal limits. Abdominal ultrasound showed possible pancreatic mass. MRCP is pending.  Hypothyroidism Continue Synthroid.  Hyponatremia KVO IV fluids.  Acute confusional state: Avoid BZD, use haldol PRN.  Hyperglycemia: Likely due to  steroids was done him a long-acting insulin 10 units twice a day and sliding scale insulin.  DVT prophylaxis: lovenox Family Communication:none Disposition Plan/Barrier to D/C: home in 4 days Code Status:     Code Status Orders        Start     Ordered   04/16/16 1839  Full code  Continuous     04/16/16 1838    Code Status History    Date Active Date Inactive Code Status Order ID Comments User Context   This patient has a current code status but no historical code status.        IV Access:    Peripheral IV   Procedures and diagnostic studies:   [name] Contrast  Result Date: 04/17/2016 CLINICAL DATA:  76 year old male with progressive headache frequency for 6-8 weeks. Increased fatigue and lethargy. Increased confusion for 4 days. Mild fever. Worsening renal function. Initial encounter. EXAM: MRI HEAD WITHOUT CONTRAST TECHNIQUE: Multiplanar, multiecho pulse sequences of the brain and surrounding structures were obtained without intravenous contrast. COMPARISON:  Face CT 03/23/2016. FINDINGS: Brain: Cerebral volume loss stable from the recent face CT, appears fairly generalized except for some bilateral disproportionate perisylvian involvement. No restricted diffusion to suggest acute infarction. No midline shift, mass effect, evidence of mass lesion, ventriculomegaly, extra-axial collection or acute intracranial hemorrhage. Cervicomedullary junction and pituitary are within normal limits. Pearline Cables and white matter signal is within normal limits for age throughout the brain. No cortical encephalomalacia or chronic cerebral blood products. Vascular: Major intracranial vascular flow voids are preserved. Skull and upper cervical spine: Negative. Visualized bone marrow signal is within normal limits. Sinuses/Orbits: Widespread sinus and nasal cavity opacification  not significantly changed from the recent face CT. 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 Right mastoid effusion appears  increased, although the nasopharynx are it within normal limits. Left mastoids are clear. Other: Negative scalp soft tissues IMPRESSION: 1. No acute intracranial abnormality. Normal for age noncontrast. And aside from nonspecific perisylvian cerebral volume loss which may be mildly age advanced. 2. Continued widespread paranasal sinus disease as described by CT in January. 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 Probably related right mastoid effusion which appears increased since January. Electronically Signed   By: Genevie Ann M.D.   On: 04/17/2016 12:13   US Abdomen Complete  Result Date: 04/17/2016 CLINICAL DATA:  Abnormal transaminases. EXAM: ABDOMEN ULTRASOUND COMPLETE COMPARISON:  None. FINDINGS: Gallbladder: No gallstones or wall thickening visualized. No sonographic Murphy sign noted by sonographer. Incidental note made of a 3 mm gallbladder wall polyp. Common bile duct: Diameter: Normal at 4 mm. Liver: No focal lesion identified. Within normal limits in parenchymal echogenicity. IVC: No abnormality visualized. Pancreas: Heterogeneous echotexture, possible hypoechoic mass versus confluent edema within the pancreatic body measuring 1.5 x 1.1 cm. No peripancreatic free fluid identified. Spleen: Size and appearance within normal limits. Right Kidney: Length: 12.4 cm. Echogenicity within normal limits. No mass or hydronephrosis visualized. Left Kidney: Length: 13.1 cm. Echogenicity within normal limits. No mass or hydronephrosis visualized. Abdominal aorta: No aneurysm visualized. Distal aorta and aortic bifurcation obscured by overlying bowel gas. Other findings: None. IMPRESSION: 1. Pancreas is heterogeneous in appearance, with possible hypoechoic mass versus confluent edema in the proximal pancreatic body measuring 1.5 x 1.1 cm. Recommend further characterization with pancreas MRI. 2. Remainder of the abdomen ultrasound is unremarkable, as detailed above. No evidence of cholecystitis. No bile duct  dilatation. Electronically Signed   By: Franki Cabot M.D.   On: 04/17/2016 10:42     Medical Consultants:    None.  Anti-Infectives:   Vanc ans zosyn  Subjective:    Nicholas Hudson He relates he feels much better than yesterday.  Objective:    Vitals:   04/18/16 0811 04/18/16 1429 04/18/16 2156 04/19/16 0543  BP:  125/67 (!) 142/91 131/79  Pulse:  74 85 78  Resp:  20 18 16   Temp: 99 F (37.2 C) 98.5 F (36.9 C) 98.3 F (36.8 C) 97.5 F (36.4 C)  TempSrc: Oral Oral Oral Oral  SpO2:  99% 98% 98%  Weight:    92.2 kg (203 lb 3.2 oz)  Height:        Intake/Output Summary (Last 24 hours) at 04/19/16 0859 Last data filed at 04/19/16 0300  Gross per 24 hour  Intake              420 ml  Output              300 ml  Net              120 ml   Filed Weights   04/17/16 0552 04/18/16 0650 04/19/16 0543  Weight: 85.9 kg (189 lb 6 oz) 91.5 kg (201 lb 11.5 oz) 92.2 kg (203 lb 3.2 oz)    Exam: General exam: In no acute distress.asymetrical Face. Respiratory system: Good air movement and clear to auscultation Cardiovascular system: S1 & S2 heard, RRR. - JVD. Gastrointestinal system: Abdomen is not distended, soft and nontender.  Extremities: 2+ edema Skin: No rashes culture ulceration. Psychiatry: Judgement and insight appear normal. Mood & affect appropriate.    Data Reviewed:    Labs: Basic Metabolic Panel:  Recent Labs  Lab 04/16/16 2141 04/17/16 0509 04/17/16 0823 04/18/16 1412 04/19/16 0512  NA 128* 129* 129* 127* 127*  K 5.0 4.0 4.0 3.8 4.0  CL 95* 96* 100* 98* 97*  CO2 22 19* 19* 16* 14*  GLUCOSE 127* 125* 115* 150* 272*  BUN 44* 44* 45* 52* 63*  CREATININE 4.84* 4.84* 4.66* 5.81* 6.29*  CALCIUM 7.7* 7.4* 6.9* 6.7* 7.0*  PHOS  --   --   --   --  8.9*   GFR Estimated Creatinine Clearance: 10.3 mL/min (by C-G formula based on SCr of 6.29 mg/dL (H)). Liver Function Tests:  Recent Labs Lab 04/16/16 1535 04/17/16 0509 04/17/16 0823  04/19/16 0512  AST 41 19 16  --   ALT 30 28 24   --   ALKPHOS 378* 336* 304*  --   BILITOT 1.2 0.8 0.8  --   PROT 6.6 6.6 5.7*  --   ALBUMIN 2.7* 2.4* 2.2* 2.1*   No results for input(s): LIPASE, AMYLASE in the last 168 hours.  Recent Labs Lab 04/16/16 1900  AMMONIA 11   Coagulation profile  Recent Labs Lab 04/16/16 1615 04/17/16 0823  INR 1.21 1.29    CBC:  Recent Labs Lab 04/16/16 1535 04/17/16 0509 04/17/16 0823  WBC 8.4 10.7* 9.4  NEUTROABS 6.6  --  7.5  HGB 13.6 11.3* 10.2*  HCT 40.1 32.2* 30.0*  MCV 85.1 86.6 85.7  PLT 194 203 185   Cardiac Enzymes:  Recent Labs Lab 04/16/16 1537  CKTOTAL 92   BNP (last 3 results) No results for input(s): PROBNP in the last 8760 hours. CBG: No results for input(s): GLUCAP in the last 168 hours. D-Dimer: No results for input(s): DDIMER in the last 72 hours. Hgb A1c: No results for input(s): HGBA1C in the last 72 hours. Lipid Profile: No results for input(s): CHOL, HDL, LDLCALC, TRIG, CHOLHDL, LDLDIRECT in the last 72 hours. Thyroid function studies:  Recent Labs  04/16/16 1906  TSH 5.396*  T3FREE 1.3*   Anemia work up: No results for input(s): VITAMINB12, FOLATE, FERRITIN, TIBC, IRON, RETICCTPCT in the last 72 hours. Sepsis Labs:  Recent Labs Lab 04/16/16 1535 04/16/16 1556 04/17/16 0509 04/17/16 0819 04/17/16 0823 04/17/16 1057  PROCALCITON  --   --   --   --  0.94  --   WBC 8.4  --  10.7*  --  9.4  --   LATICACIDVEN  --  1.47  --  0.8  --  0.7   Microbiology Recent Results (from the past 240 hour(s))  Culture, blood (Routine x 2)     Status: None (Preliminary result)   Collection Time: 04/16/16  3:32 PM  Result Value Ref Range Status   Specimen Description BLOOD RIGHT WRIST  Final   Special Requests BOTTLES DRAWN AEROBIC AND ANAEROBIC 5 CC EACH  Final   Culture   Final    NO GROWTH 2 DAYS Performed at [facility], [place] [address], [place], [place] [address]    Report Status PENDING   Incomplete  Culture, blood (Routine x 2)     Status: None (Preliminary result)   Collection Time: 04/16/16  4:15 PM  Result Value Ref Range Status   Specimen Description BLOOD RIGHT HAND  Final   Special Requests BOTTLES DRAWN AEROBIC ONLY 5 CC  Final   Culture   Final    NO GROWTH 2 DAYS Performed at [facility], [address] [place], [place] [address]    Report  Status PENDING  Incomplete  Urine culture     Status: None   Collection Time: 04/16/16  4:23 PM  Result Value Ref Range Status   Specimen Description URINE, RANDOM  Final   Special Requests NONE  Final   Culture   Final    NO GROWTH Performed at [facility], [address] [place], [place] [address]    Report Status 04/18/2016 FINAL  Final  Culture, blood (x 2)     Status: None (Preliminary result)   Collection Time: 04/17/16  8:19 AM  Result Value Ref Range Status   Specimen Description BLOOD LEFT ANTECUBITAL  Final   Special Requests BOTTLES DRAWN AEROBIC AND ANAEROBIC 5CC  Final   Culture   Final    NO GROWTH 1 DAY Performed at [facility], [place] [address], [place], [place] [address]    Report Status PENDING  Incomplete  Culture, blood (x 2)     Status: None (Preliminary result)   Collection Time: 04/17/16  8:27 AM  Result Value Ref Range Status   Specimen Description BLOOD RIGHT HAND  Final   Special Requests BOTTLES DRAWN AEROBIC AND ANAEROBIC 5CC  Final   Culture   Final    NO GROWTH 1 DAY Performed at [facility], [place] [address] [place], [place] [address]    Report Status PENDING  Incomplete     Medications:   . aspirin EC  81 mg Oral Daily  . atorvastatin  40 mg Oral q1800  . finasteride  5 mg Oral Daily  . furosemide  80 mg Intravenous Q8H  . gabapentin  100 mg Oral QHS  . heparin  5,000 Units Subcutaneous Q8H  . levothyroxine  200 mcg Oral QAC breakfast  . methylPREDNISolone (SOLU-MEDROL) injection  500 mg Intravenous Q12H  . piperacillin-tazobactam (ZOSYN)  IV   2.25 g Intravenous Q8H  . pneumococcal 23 valent vaccine  0.5 mL Intramuscular Tomorrow-1000  . senna  1 tablet Oral BID   Continuous Infusions:   Time spent: 35 min   LOS: 3 days   Charlynne Cousins  [facility] Pager [phone]  *Please refer to [email or web address], password [email or web address] to get updated schedule on who will round on this patient, as hospitalists switch teams weekly. If 7PM-7AM, please contact night-coverage at [email or web address], password [email or web address] for any overnight needs.  04/19/2016, 8:59 AM

## 2016-04-19 NOTE — Care Management Note (Signed)
Case Management Note  Patient Details  Name: Nicholas Hudson MRN: [id number] Date of Birth: Aug 01, 1940  Subjective/Objective:    Pt admitted with AKI, transferring to [facility]                Action/Plan: Pt plan to discharge home, with good family support  Expected Discharge Date:  04/25/16               Expected Discharge Plan:  Home/Self Care  In-House Referral:  NA  Discharge planning Services  NA  Post Acute Care Choice:  NA Choice offered to:  NA  DME Arranged:  [name] DME Agency:     HH Arranged:    [place] Agency:     Status of Service:  In process, will continue to follow  If discussed at Long Length of Stay Meetings, dates discussed:    Additional CommentsPurcell Mouton, RN 04/19/2016, 3:16 PM

## 2016-04-19 NOTE — Progress Notes (Signed)
New Admission Note:   Arrival Method: Stretcher  Mental Orientation: A&O X4 Telemetry: Not ordered Assessment: Completed Skin: Clean, Dry, Intact Iv: Clean, Dry, Intact, Saline Locked Pain: Denies Unit Orientation: Patient has been orientated to the room, unit and staff.  Family: At bedside   Orders have been reviewed and implemented. Will continue to monitor the patient. Call light has been placed within reach and bed alarm has been activated.    Dixie Dials RN, BSN

## 2016-04-20 ENCOUNTER — Inpatient Hospital Stay (HOSPITAL_COMMUNITY): Payer: Medicare Other

## 2016-04-20 LAB — HIV ANTIBODY (ROUTINE TESTING W REFLEX): HIV Screen 4th Generation wRfx: NONREACTIVE

## 2016-04-20 LAB — PROTEIN ELECTROPHORESIS, SERUM
A/G RATIO SPE: 0.5 — AB (ref 0.7–1.7)
ALPHA-1-GLOBULIN: 0.6 g/dL — AB (ref 0.0–0.4)
ALPHA-2-GLOBULIN: 1.4 g/dL — AB (ref 0.4–1.0)
Albumin ELP: 2.1 g/dL — ABNORMAL LOW (ref 2.9–4.4)
BETA GLOBULIN: 1.1 g/dL (ref 0.7–1.3)
GAMMA GLOBULIN: 0.9 g/dL (ref 0.4–1.8)
Globulin, Total: 3.9 g/dL (ref 2.2–3.9)
Total Protein ELP: 6 g/dL (ref 6.0–8.5)

## 2016-04-20 LAB — ANTISTREPTOLYSIN O TITER: ASO: 29 [IU]/mL (ref 0.0–200.0)

## 2016-04-20 LAB — GLUCOSE, CAPILLARY
GLUCOSE-CAPILLARY: 169 mg/dL — AB (ref 65–99)
Glucose-Capillary: 176 mg/dL — ABNORMAL HIGH (ref 65–99)
Glucose-Capillary: 167 mg/dL — ABNORMAL HIGH (ref 65–99)
Glucose-Capillary: 140 mg/dL — ABNORMAL HIGH (ref 65–99)

## 2016-04-20 LAB — C4 COMPLEMENT: COMPLEMENT C4, BODY FLUID: 49 mg/dL — AB (ref 14–44)

## 2016-04-20 LAB — KAPPA/LAMBDA LIGHT CHAINS
KAPPA, LAMDA LIGHT CHAIN RATIO: 1.78 — AB (ref 0.26–1.65)
Kappa free light chain: 137.1 mg/L — ABNORMAL HIGH (ref 3.3–19.4)
Lambda free light chains: 76.9 mg/L — ABNORMAL HIGH (ref 5.7–26.3)

## 2016-04-20 LAB — BASIC METABOLIC PANEL
ANION GAP: 18 — AB (ref 5–15)
BUN: 78 mg/dL — AB (ref 6–20)
CO2: 14 mmol/L — ABNORMAL LOW (ref 22–32)
Calcium: 7.3 mg/dL — ABNORMAL LOW (ref 8.9–10.3)
Chloride: 96 mmol/L — ABNORMAL LOW (ref 101–111)
Creatinine, Ser: 7.27 mg/dL — ABNORMAL HIGH (ref 0.61–1.24)
GFR calc Af Amer: 7 mL/min — ABNORMAL LOW (ref >60)
GFR, EST NON AFRICAN AMERICAN: 6 mL/min — AB (ref >60)
Glucose, Bld: 184 mg/dL — ABNORMAL HIGH (ref 65–99)
POTASSIUM: 4 mmol/L (ref 3.5–5.1)
SODIUM: 128 mmol/L — AB (ref 135–145)

## 2016-04-20 LAB — HEMOGLOBIN A1C
Hgb A1c MFr Bld: 6.5 % — ABNORMAL HIGH (ref 4.8–5.6)
MEAN PLASMA GLUCOSE: 140 mg/dL

## 2016-04-20 LAB — HEPATITIS PANEL, ACUTE
HCV Ab: <0.1 s/co ratio (ref 0.0–0.9)
Hep A IgM: NEGATIVE
Hep B C IgM: NEGATIVE
Hepatitis B Surface Ag: NEGATIVE

## 2016-04-20 LAB — GLOMERULAR BASEMENT MEMBRANE ANTIBODIES: GBM Ab: 3 units (ref 0–20)

## 2016-04-20 LAB — ANTINUCLEAR ANTIBODIES, IFA: ANTINUCLEAR ANTIBODIES, IFA: NEGATIVE

## 2016-04-20 LAB — ANTI-DNA ANTIBODY, DOUBLE-STRANDED

## 2016-04-20 LAB — MPO/PR-3 (ANCA) ANTIBODIES
ANCA Proteinase 3: <3.5 U/mL (ref 0.0–3.5)
Myeloperoxidase Abs: 29.1 U/mL — ABNORMAL HIGH (ref 0.0–9.0)

## 2016-04-20 LAB — C3 COMPLEMENT: C3 COMPLEMENT: 177 mg/dL — AB (ref 82–167)

## 2016-04-20 MED ORDER — MIDAZOLAM HCL 2 MG/2ML IJ SOLN
INTRAMUSCULAR | Status: AC | PRN
  Administered 2016-04-20: 1 mg via INTRAVENOUS

## 2016-04-20 MED ORDER — FENTANYL CITRATE (PF) 100 MCG/2ML IJ SOLN
INTRAMUSCULAR | Status: AC | PRN
  Administered 2016-04-20: 25 ug via INTRAVENOUS

## 2016-04-20 MED ORDER — FENTANYL CITRATE (PF) 100 MCG/2ML IJ SOLN
INTRAMUSCULAR | Status: AC
  Filled 2016-04-20: qty 2

## 2016-04-20 MED ORDER — MIDAZOLAM HCL 2 MG/2ML IJ SOLN
INTRAMUSCULAR | Status: AC
  Filled 2016-04-20: qty 2

## 2016-04-20 MED ORDER — LIDOCAINE HCL 1 % IJ SOLN
INTRAMUSCULAR | Status: AC
  Filled 2016-04-20: qty 20

## 2016-04-20 NOTE — Progress Notes (Signed)
Order to hold heparin dose on 2/6 PM and 2/7 Am. Orders followed. Heparin PM dose given. Will continue to monitor biopsy site.

## 2016-04-20 NOTE — Progress Notes (Signed)
The patient is back from his renal biopsy. Maryland Pink, MD notified to resume previous diet. Maryland Pink, MD gave verbal order to resume previous diet. Will continue to monitor.

## 2016-04-20 NOTE — Progress Notes (Signed)
[facility] Progress Note   Subjective: creat up again to 7's.  Better UOP on IV lasix, 1300 cc.  +nauseous today  Vitals:   04/19/16 2122 04/19/16 2243 04/20/16 0500 04/20/16 0840  BP: 118/63  132/74 135/74  Pulse: 74 75 73 72  Resp: 16 16 14 14   Temp: 97.5 F (36.4 C)  97.5 F (36.4 C) 97.5 F (36.4 C)  TempSrc: Oral  Oral Oral  SpO2: 96% 96% 98% 98%  Weight: 92 kg (202 lb 13.2 oz)     Height:        Inpatient medications: . aspirin EC  81 mg Oral Daily  . atorvastatin  40 mg Oral q1800  . finasteride  5 mg Oral Daily  . furosemide  80 mg Intravenous Q8H  . gabapentin  100 mg Oral QHS  . heparin  5,000 Units Subcutaneous Q8H  . insulin aspart  0-15 Units Subcutaneous TID WC  . insulin aspart  0-5 Units Subcutaneous QHS  . insulin aspart  4 Units Subcutaneous TID WC  . insulin detemir  10 Units Subcutaneous BID  . levothyroxine  200 mcg Oral QAC breakfast  . methylPREDNISolone (SOLU-MEDROL) injection  500 mg Intravenous Q12H  . piperacillin-tazobactam (ZOSYN)  IV  2.25 g Intravenous Q8H  . senna  1 tablet Oral BID    acetaminophen **OR** acetaminophen, ondansetron **OR** ondansetron (ZOFRAN) IV, traMADol, zolpidem  Exam: Gen alert, pleasant, no distress No jvd Chest clear bilat to bases RRR distant HS, no mrg Abd soft ntnd no mass or ascites +bs obese and tympanitic Ext improving LE edema 1+ Neuro is alert, Ox 3 , nf, no asterixis  Urine (by undersigned) > clear , yellow, 2+ blood and protein, 0-5 rbc/ wbc's per HPF.  Numerous granular casts, numerous degenerating cellular casts, +rbc casts and mixed rbc/ wbc casts.   Abd [place] > 13 cm kidneys, no hydro  Assessment: 1.  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 In setting of sinus disease ANCA-related GN might be expected.  Some serologies back and negative, ANCA/ ANA still pending. If creat continues to rise will need HD in  1-2 days.  For biopsy today. After finish IV bolus steroids will start pred 60/ d.  2.  BPH - no sign of hydro on [place] 3.  Volume - LE edema, will dc lasix   Plan - as above   Kelly Splinter MD [facility] pager [phone]   04/20/2016, 11:00 AM    Recent Labs Lab 04/18/16 1412 04/19/16 0512 04/20/16 0953  NA 127* 127* 128*  K 3.8 4.0 4.0  CL 98* 97* 96*  CO2 16* 14* 14*  GLUCOSE 150* 272* 184*  BUN 52* 63* 78*  CREATININE 5.81* 6.29* 7.27*  CALCIUM 6.7* 7.0* 7.3*  PHOS  --  8.9*  --     Recent Labs Lab 04/16/16 1535 04/17/16 0509 04/17/16 0823 04/19/16 0512  AST 41 19 16  --   ALT 30 28 24   --   ALKPHOS 378* 336* 304*  --   BILITOT 1.2 0.8 0.8  --   PROT 6.6 6.6 5.7*  --   ALBUMIN 2.7* 2.4* 2.2* 2.1*    Recent Labs Lab 04/16/16 1535 04/17/16 0509 04/17/16 0823  WBC 8.4 10.7* 9.4  NEUTROABS 6.6  --  7.5  HGB 13.6 11.3* 10.2*  HCT 40.1 32.2* 30.0*  MCV 85.1 86.6 85.7  PLT 194 203  185   Iron/TIBC/Ferritin/ %Sat No results found for: IRON, TIBC, FERRITIN, IRONPCTSAT

## 2016-04-20 NOTE — Progress Notes (Signed)
Pharmacy Antibiotic Note  Nicholas Hudson is a 76 y.o. male admitted on 04/16/2016 with sepsis.  Pharmacy has been consulted for Zosyn dosing.  Plan: Zosyn 2.25g IV q8h Can consider transitioning to HD dosing when schedule and tolerance more clear Follow up cultures, de-escalate as appropriate  Height: 5\' 5"  (165.1 cm) Weight: 202 lb 13.2 oz (92 kg) IBW/kg (Calculated) : 61.5  Temp (24hrs), Avg:97.5 F (36.4 C), Min:97.5 F (36.4 C), Max:97.6 F (36.4 C)   Recent Labs Lab 04/16/16 1535 04/16/16 1556  04/17/16 0509 04/17/16 0819 04/17/16 0823 04/17/16 1057 04/18/16 1412 04/19/16 0512 04/20/16 0953  WBC 8.4  --   --  10.7*  --  9.4  --   --   --   --   CREATININE 4.46*  --   < > 4.84*  --  4.66*  --  5.81* 6.29* 7.27*  LATICACIDVEN  --  1.47  --   --  0.8  --  0.7  --   --   --   < > = values in this interval not displayed.  Estimated Creatinine Clearance: 8.9 mL/min (by C-G formula based on SCr of 7.27 mg/dL (H)).    No Known Allergies  Antimicrobials this admission:  Vanc 2/4>> 2/6  Zosyn 2/4>>  Dose adjustments this admission:  n/a  Microbiology results:  2/3 BCx: ngtd 2/3 UCx: neg  2/4 BCx: ngtd 2/3 Influenza panel: neg  Thank you for allowing pharmacy to be a part of this patient's care.  Onesty Clair D. Rondia Higginbotham, PharmD, BCPS Clinical Pharmacist Pager: [phone] 04/20/2016 1:41 PM

## 2016-04-20 NOTE — Consult Note (Signed)
[facility] CM Primary Care Navigator  04/20/2016  Nicholas Hudson [birth year] [id number]  Met with patient and wife Nicholas Hudson) at the bedside to identify possible discharge needs. Patient and wife reported that he was transferred to [facility] hospital for kidney biopsy and possible dialysis. Patient stated he was feeling weak, had decreased appetite and fever that had led to this admission.  Patient endorses Dr. Seward Carol with [facility] [place] as his primary care provider.    Patient shared using [facility] at [facility] to obtain medications without any problem.   Patient manages his medications at home with wife's assistance using "pill box" system every week.    Patient verbalized that he drives prior to admission, however, his wife or son Nicholas Hudson.) will be able to provide transportation to his doctors' appointments after discharge.  Wife will be his primary caregiver at home as needed.   Anticipated discharge plan is home, with good family support.  Patient and wife voiced understanding to call primary care provider's office for a post discharge follow-up appointment within a week or sooner if needs arise. Patient letter (with PCP's contact number) provided as their reminder.  Both patient and wife communicated no health management needs or concerns at this time.  For additional questions please contact:  Edwena Felty A. Gudelia Eugene, BSN, RN-BC [facility] PRIMARY CARE Navigator Cell: [phone]

## 2016-04-20 NOTE — Progress Notes (Addendum)
[facility] PROGRESS NOTE  Nicholas Hudson [id number] DOB: [birth year] DOA: 04/16/2016  PCP: Kandice Hams, MD  Brief History/Interval Summary: Nicholas Hudson is an 76 y.o. male with medical history significant of PBH, gout, recurrent sinus infection, hypothyroidism. Patient with history of sinus, polyps problems, infection for last 6 to 8 weeks, he has received 2 course of antibiotics by Dr Lucia Gaskins. Since 6 to 8 weeks ago he has been having headaches, less active, more sleepy Since 4 days ago he was notice to be confuse, and lethargic by family. Patient was found to have worsening renal function. He was hospitalized for further management.   Reason for Visit: Acute renal failure  Consultants: Nephrology  Procedures: Renal biopsy 2/7  Echocardiogram Study Conclusions  - Left ventricle: The cavity size was normal. There was moderate   concentric hypertrophy. Systolic function was normal. The   estimated ejection fraction was in the range of 50% to 55%. Wall   motion was normal; there were no regional wall motion   abnormalities. Doppler parameters are consistent with abnormal   left ventricular relaxation (grade 1 diastolic dysfunction). - Aortic valve: Trileaflet; mildly thickened, mildly calcified   leaflets. - Aorta: Ascending aortic diameter: 42 mm (S). - Ascending aorta: The ascending aorta was mildly dilated. - Mitral valve: There was mild regurgitation. - Left atrium: The atrium was mildly dilated.  Antibiotics: Initially started on vancomycin and Zosyn. Vancomycin has been discontinued  Subjective/Interval History: Patient states that he's feeling better. Feels less confused. His son is at the bedside. Patient denies any pain. Some nausea but no vomiting  ROS: Denies any shortness of breath  Objective:  Vital Signs  Vitals:   04/20/16 1518 04/20/16 1530 04/20/16 1545 04/20/16 1629  BP: 127/71 136/74 128/80 120/60  Pulse: 69 68 73 72  Resp: 16 16 14 17     Temp: 97.5 F (36.4 C) 97.4 F (36.3 C) 97.4 F (36.3 C) 97.4 F (36.3 C)  TempSrc: Oral Oral Oral Oral  SpO2: 98% 98% 100% 99%  Weight:      Height:        Intake/Output Summary (Last 24 hours) at 04/20/16 1721 Last data filed at 04/20/16 1605  Gross per 24 hour  Intake              866 ml  Output             1450 ml  Net             -584 ml   Filed Weights   04/18/16 0650 04/19/16 0543 04/19/16 2122  Weight: 91.5 kg (201 lb 11.5 oz) 92.2 kg (203 lb 3.2 oz) 92 kg (202 lb 13.2 oz)    General appearance: alert, cooperative, appears stated age and no distress Resp: clear to auscultation bilaterally Cardio: regular rate and rhythm, S1, S2 normal, no murmur, click, rub or gallop GI: soft, non-tender; bowel sounds normal; no masses,  no organomegaly Extremities: extremities normal, atraumatic, no cyanosis or edema Neurologic: Awake and alert. Slightly distracted. Oriented to person, place. No facial asymmetry. Motor strength and equal bilateral upper and lower extremities.  Lab Results:  Data Reviewed: I have personally reviewed following labs and imaging studies  CBC:  Recent Labs Lab 04/16/16 1535 04/17/16 0509 04/17/16 0823  WBC 8.4 10.7* 9.4  NEUTROABS 6.6  --  7.5  HGB 13.6 11.3* 10.2*  HCT 40.1 32.2* 30.0*  MCV 85.1 86.6 85.7  PLT 194 203 185    Basic  Metabolic Panel:  Recent Labs Lab 04/17/16 0509 04/17/16 0823 04/18/16 1412 04/19/16 0512 04/20/16 0953  NA 129* 129* 127* 127* 128*  K 4.0 4.0 3.8 4.0 4.0  CL 96* 100* 98* 97* 96*  CO2 19* 19* 16* 14* 14*  GLUCOSE 125* 115* 150* 272* 184*  BUN 44* 45* 52* 63* 78*  CREATININE 4.84* 4.66* 5.81* 6.29* 7.27*  CALCIUM 7.4* 6.9* 6.7* 7.0* 7.3*  PHOS  --   --   --  8.9*  --     GFR: Estimated Creatinine Clearance: 8.9 mL/min (by C-G formula based on SCr of 7.27 mg/dL (H)).  Liver Function Tests:  Recent Labs Lab 04/16/16 1535 04/17/16 0509 04/17/16 0823 04/19/16 0512  AST 41 19 16  --   ALT  30 28 24   --   ALKPHOS 378* 336* 304*  --   BILITOT 1.2 0.8 0.8  --   PROT 6.6 6.6 5.7*  --   ALBUMIN 2.7* 2.4* 2.2* 2.1*     Recent Labs Lab 04/16/16 1900  AMMONIA 11    Coagulation Profile:  Recent Labs Lab 04/16/16 1615 04/17/16 0823  INR 1.21 1.29    Cardiac Enzymes:  Recent Labs Lab 04/16/16 1537  CKTOTAL 92    HbA1C:  Recent Labs  04/19/16 0512  HGBA1C 6.5*    CBG:  Recent Labs Lab 04/19/16 1646 04/19/16 2120 04/20/16 0753 04/20/16 1110 04/20/16 1601  GLUCAP 252* 244* 176* 167* 140*     Recent Results (from the past 240 hour(s))  Culture, blood (Routine x 2)     Status: None (Preliminary result)   Collection Time: 04/16/16  3:32 PM  Result Value Ref Range Status   Specimen Description BLOOD RIGHT WRIST  Final   Special Requests BOTTLES DRAWN AEROBIC AND ANAEROBIC 5 CC EACH  Final   Culture   Final    NO GROWTH 4 DAYS Performed at [facility], [place] [address] [place], [place] [address]    Report Status PENDING  Incomplete  Culture, blood (Routine x 2)     Status: None (Preliminary result)   Collection Time: 04/16/16  4:15 PM  Result Value Ref Range Status   Specimen Description BLOOD RIGHT HAND  Final   Special Requests BOTTLES DRAWN AEROBIC ONLY 5 CC  Final   Culture   Final    NO GROWTH 4 DAYS Performed at [facility], [place] [address], [place], [place] [address]    Report Status PENDING  Incomplete  Urine culture     Status: None   Collection Time: 04/16/16  4:23 PM  Result Value Ref Range Status   Specimen Description URINE, RANDOM  Final   Special Requests NONE  Final   Culture   Final    NO GROWTH Performed at [facility], [place] [address], [place], [place] [address]    Report Status 04/18/2016 FINAL  Final  Culture, blood (x 2)     Status: None (Preliminary result)   Collection Time: 04/17/16  8:19 AM  Result Value Ref Range Status   Specimen Description BLOOD LEFT ANTECUBITAL  Final   Special Requests  BOTTLES DRAWN AEROBIC AND ANAEROBIC 5CC  Final   Culture   Final    NO GROWTH 3 DAYS Performed at [facility], [place] [address] [place], [place] [address]    Report Status PENDING  Incomplete  Culture, blood (x 2)     Status: None (Preliminary result)   Collection Time:  04/17/16  8:27 AM  Result Value Ref Range Status   Specimen Description BLOOD RIGHT HAND  Final   Special Requests BOTTLES DRAWN AEROBIC AND ANAEROBIC 5CC  Final   Culture   Final    NO GROWTH 3 DAYS Performed at [facility], [address], [place], [place] [address]    Report Status PENDING  Incomplete      Radiology Studies: Mr Abdomen Mrcp Wo Contrast  Result Date: 04/19/2016 CLINICAL DATA:  Possible pancreatic mass on ultrasound. EXAM: MRI ABDOMEN WITHOUT CONTRAST  (INCLUDING MRCP) TECHNIQUE: Multiplanar multisequence MR imaging of the abdomen was performed. Heavily T2-weighted images of the biliary and pancreatic ducts were obtained, and three-dimensional MRCP images were rendered by post processing. IV contrast could not be given due to renal failure. COMPARISON:  04/17/2016 FINDINGS: Despite efforts by the technologist and patient, motion artifact is present on today's exam and could not be eliminated. This reduces exam sensitivity and specificity. Lower chest: Suspected mild atelectasis in the lower lobes. Hepatobiliary: Contracted gallbladder. Motion artifact precludes sensitive assessment of the biliary tree due to its severity, but there is no biliary dilatation. Pancreas: In the area of concern along the dorsal pancreas at the junction of the pancreatic body and tail, no abnormality is observed. The pancreas appears normal on MRI. Spleen:  Unremarkable Adrenals/Urinary Tract:  Unremarkable Stomach/Bowel: Unremarkable Vascular/Lymphatic:  Unremarkable Other:  No supplemental non-categorized findings. Musculoskeletal: Unremarkable IMPRESSION: 1. In the area of concern on ultrasound there is no abnormality  on today' s noncontrast MRI. I suspect that the region was spurious or due to adjacent bowel. 2. Mild bibasilar atelectasis. 3. There is fairly severe motion artifact which precludes sensitive assessment of the biliary tree, but no biliary dilatation is observed. Electronically Signed   By: Van Clines M.D.   On: 04/19/2016 12:12   Mr 3d Recon At Scanner  Result Date: 04/19/2016 CLINICAL DATA:  Possible pancreatic mass on ultrasound. EXAM: MRI ABDOMEN WITHOUT CONTRAST  (INCLUDING MRCP) TECHNIQUE: Multiplanar multisequence MR imaging of the abdomen was performed. Heavily T2-weighted images of the biliary and pancreatic ducts were obtained, and three-dimensional MRCP images were rendered by post processing. IV contrast could not be given due to renal failure. COMPARISON:  04/17/2016 FINDINGS: Despite efforts by the technologist and patient, motion artifact is present on today's exam and could not be eliminated. This reduces exam sensitivity and specificity. Lower chest: Suspected mild atelectasis in the lower lobes. Hepatobiliary: Contracted gallbladder. Motion artifact precludes sensitive assessment of the biliary tree due to its severity, but there is no biliary dilatation. Pancreas: In the area of concern along the dorsal pancreas at the junction of the pancreatic body and tail, no abnormality is observed. The pancreas appears normal on MRI. Spleen:  Unremarkable Adrenals/Urinary Tract:  Unremarkable Stomach/Bowel: Unremarkable Vascular/Lymphatic:  Unremarkable Other:  No supplemental non-categorized findings. Musculoskeletal: Unremarkable IMPRESSION: 1. In the area of concern on ultrasound there is no abnormality on today' s noncontrast MRI. I suspect that the region was spurious or due to adjacent bowel. 2. Mild bibasilar atelectasis. 3. There is fairly severe motion artifact which precludes sensitive assessment of the biliary tree, but no biliary dilatation is observed. Electronically Signed   By:  Van Clines M.D.   On: 04/19/2016 12:12   US Biopsy  Result Date: 04/20/2016 CLINICAL DATA:  Progressive renal failure and need for renal biopsy. EXAM: ULTRASOUND GUIDED CORE BIOPSY OF RIGHT KIDNEY MEDICATIONS: 1.0 mg IV Versed; 25 mcg IV Fentanyl Total Moderate  Sedation Time: 18 minutes. The patient's level of consciousness and physiologic status were continuously monitored during the procedure by Radiology nursing. PROCEDURE: The procedure, risks, benefits, and alternatives were explained to the patient. Questions regarding the procedure were encouraged and answered. The patient understands and consents to the procedure. A time out was performed prior to initiating the procedure. Ultrasound was performed of both kidneys from a posterior approach. The right flank region was prepped with chlorhexidine in a sterile fashion, and a sterile drape was applied covering the operative field. A sterile gown and sterile gloves were used for the procedure. Local anesthesia was provided with 1% Lidocaine. Under ultrasound guidance, 2 separate core biopsy samples were obtained through lower pole cortex of the right kidney. COMPLICATIONS: None. FINDINGS: Both kidneys were visualized by ultrasound. The right kidney was more superficial in location and better visualized for biopsy. Solid tissue samples were obtained from lower pole cortex. Post biopsy imaging shows no immediate evidence of perinephric hemorrhage. IMPRESSION: Ultrasound-guided core biopsy performed of right lower pole renal cortex. Electronically Signed   By: Aletta Edouard M.D.   On: 04/20/2016 15:31     Medications:  Scheduled: . aspirin EC  81 mg Oral Daily  . atorvastatin  40 mg Oral q1800  . finasteride  5 mg Oral Daily  . gabapentin  100 mg Oral QHS  . insulin aspart  0-15 Units Subcutaneous TID WC  . insulin aspart  0-5 Units Subcutaneous QHS  . insulin aspart  4 Units Subcutaneous TID WC  . insulin detemir  10 Units Subcutaneous BID    . levothyroxine  200 mcg Oral QAC breakfast  . methylPREDNISolone (SOLU-MEDROL) injection  500 mg Intravenous Q12H  . piperacillin-tazobactam (ZOSYN)  IV  2.25 g Intravenous Q8H  . senna  1 tablet Oral BID   Continuous:  [id number] **OR** acetaminophen, ondansetron **OR** ondansetron (ZOFRAN) IV, traMADol, zolpidem  Assessment/Plan:  Active Problems:   AKI (acute kidney injury) ([place])   Abnormal transaminases   Frequent headaches   Hypothyroidism   Hyponatremia   Sepsis ([place])   Metabolic encephalopathy    Sepsis of unclear source: Culture data negative till date. Repeat chest x-ray is negative. Patient was initially started on the sepsis pathway. He was given vancomycin and Zosyn. Vancomycin was discontinued due to renal failure. No clear infectious foci. MRI brain did not show any infectious foci. MRI abdomen did not show any intra-abdominal process. WBC is normal. Okay to discontinue Zosyn. Patient could've had a viral syndrome. MRI brain did show paranasal sinus disease and mastoid fluid. However patient has recently completed multiple courses of antibiotics for same.  Metabolic encephalopathy: MRI brain did not show any acute process. Confusion could be due to acute renal failure. Now appears to be close to baseline.  AKI on chronic: Patient's acute kidney injury appears to have developed over the last 1-2 months. His creatinine was 1.0 about a year ago. Nephrology was consulted. 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 Patient transferred over to this facility for further management. Renal biopsy was performed today. Patient may need to undergo hemodialysis. Patient also started on high-dose steroids.   Abnormal transaminases Patient noted an elevated alkaline phosphatase. AST, ALT normal. Bilirubin normal. Abdominal ultrasound showed possible pancreatic mass. MRI did not show any lesion in the pancreas. Finding on the ultrasound was thought to be artifactual from  bowel.  Hypothyroidism Continue Synthroid.  Hyponatremia Sodium level remains low but stable.  Hyperglycemia: Likely due to steroids. Monitor CBGs. Continue insulin.  DVT Prophylaxis: Subcutaneous heparin on hold due to renal biopsy    Code Status: Full code  Family Communication: Discussed with patient and his son  Disposition Plan: Management per nephrology.    LOS: 4 days   [facility] Pager [phone] 04/20/2016, 5:21 PM  If 7PM-7AM, please contact night-coverage at [email or web address], password [facility]

## 2016-04-20 NOTE — Procedures (Signed)
Interventional Radiology Procedure Note  Procedure:  US guided right renal biopsy  Complications: None  Estimated Blood Loss: < 10 mL  16 G core biopsy x 2 of right LP renal cortex.  Venetia Night. Kathlene Cote, M.D Pager:  [phone]

## 2016-04-20 NOTE — Progress Notes (Signed)
MEDICATION-RELATED CONSULT NOTE   IR Procedure Consult - Anticoagulant/Antiplatelet PTA/Inpatient Med List Review by Pharmacist    Procedure:  Renal biopsy    Completed: 04/20/16 ~ended prior to 3pm ( pt back to 6E on at 3pm per RN0   Post-Procedural bleeding risk per IR MD assessment:  Not indicated on IR note.  Antithrombotic medications on inpatient or PTA profile prior to procedure:    Heparin 5000 units SQ q8h.  No anticoagulation PTA.    Recommended restart time per IR Post-Procedure Guidelines:   8 hours post after end of procedure   Plan:    I spoke to Nicholas Hudson get his opinion/ approval on appropriate time to resume SQ heparin after renal biopsy as in past nephrology has wanted to hold the heparin more than 8 hrs post procedure. I reported that the SQ heparin dose was given this afternoon at 15:08 post renal biopsy.  Nicholas Hudson wants the SQ heparin discontinued.   Nicholas Hudson, RPh Clinical Pharmacist Pager: [phone] 04/20/2016 4:20 PM

## 2016-04-20 NOTE — Care Management Important Message (Signed)
Important Message  Patient Details  Name: Nicholas Hudson MRN: [id number] Date of Birth: 1940-12-27   Medicare Important Message Given:  Yes    Laurali Goddard 04/20/2016, 10:03 AM

## 2016-04-21 LAB — GLUCOSE, CAPILLARY
GLUCOSE-CAPILLARY: 178 mg/dL — AB (ref 65–99)
GLUCOSE-CAPILLARY: 215 mg/dL — AB (ref 65–99)
Glucose-Capillary: 135 mg/dL — ABNORMAL HIGH (ref 65–99)
Glucose-Capillary: 191 mg/dL — ABNORMAL HIGH (ref 65–99)

## 2016-04-21 LAB — BASIC METABOLIC PANEL
ANION GAP: 20 — AB (ref 5–15)
BUN: 94 mg/dL — ABNORMAL HIGH (ref 6–20)
CO2: 14 mmol/L — AB (ref 22–32)
Calcium: 7.1 mg/dL — ABNORMAL LOW (ref 8.9–10.3)
Chloride: 93 mmol/L — ABNORMAL LOW (ref 101–111)
Creatinine, Ser: 7.66 mg/dL — ABNORMAL HIGH (ref 0.61–1.24)
GFR calc Af Amer: 7 mL/min — ABNORMAL LOW (ref >60)
GFR, EST NON AFRICAN AMERICAN: 6 mL/min — AB (ref >60)
GLUCOSE: 152 mg/dL — AB (ref 65–99)
POTASSIUM: 3.9 mmol/L (ref 3.5–5.1)
Sodium: 127 mmol/L — ABNORMAL LOW (ref 135–145)

## 2016-04-21 LAB — CULTURE, BLOOD (ROUTINE X 2)
CULTURE: NO GROWTH
Culture: NO GROWTH

## 2016-04-21 LAB — CBC
HEMATOCRIT: 26.7 % — AB (ref 39.0–52.0)
Hemoglobin: 9.2 g/dL — ABNORMAL LOW (ref 13.0–17.0)
MCH: 28.6 pg (ref 26.0–34.0)
MCHC: 34.5 g/dL (ref 30.0–36.0)
MCV: 82.9 fL (ref 78.0–100.0)
Platelets: 299 10*3/uL (ref 150–400)
RBC: 3.22 MIL/uL — AB (ref 4.22–5.81)
RDW: 12.9 % (ref 11.5–15.5)
WBC: 5.8 10*3/uL (ref 4.0–10.5)

## 2016-04-21 MED ORDER — PREDNISONE 50 MG PO TABS
60.0000 mg | ORAL_TABLET | Freq: Every day | ORAL | Status: DC
  Administered 2016-04-21 – 2016-04-29 (×8): 60 mg via ORAL
  Filled 2016-04-21 (×9): qty 1

## 2016-04-21 NOTE — Progress Notes (Signed)
[facility] PROGRESS NOTE  Nicholas Hudson [id number] DOB: [birth year] DOA: 04/16/2016  PCP: Kandice Hams, MD  Brief History/Interval Summary: Nicholas Hudson is an 76 y.o. male with medical history significant of PBH, gout, recurrent sinus infection, hypothyroidism. Patient with history of sinus, polyps problems, infection for last 6 to 8 weeks, he has received 2 course of antibiotics by Dr Lucia Gaskins. Patient was found to have worsening renal function. He was hospitalized for further management.   Reason for Visit: Acute renal failure  Consultants: Nephrology  Procedures: Renal biopsy 2/7  Echocardiogram Study Conclusions  - Left ventricle: The cavity size was normal. There was moderate   concentric hypertrophy. Systolic function was normal. The   estimated ejection fraction was in the range of 50% to 55%. Wall   motion was normal; there were no regional wall motion   abnormalities. Doppler parameters are consistent with abnormal   left ventricular relaxation (grade 1 diastolic dysfunction). - Aortic valve: Trileaflet; mildly thickened, mildly calcified   leaflets. - Aorta: Ascending aortic diameter: 42 mm (S). - Ascending aorta: The ascending aorta was mildly dilated. - Mitral valve: There was mild regurgitation. - Left atrium: The atrium was mildly dilated.  Antibiotics: Initially started on vancomycin and Zosyn. Both of these antibiotics have been discontinued.  Subjective/Interval History: Patient feels well. Denies any complaints. No problems with the renal biopsy yesterday. His son is at the bedside.   ROS: Denies any shortness of breath  Objective:  Vital Signs  Vitals:   04/20/16 1629 04/20/16 1846 04/20/16 2100 04/21/16 0500  BP: 120/60 133/74 139/79 (!) 144/68  Pulse: 72 66 72 64  Resp: 17 15    Temp: 97.4 F (36.3 C)  97.4 F (36.3 C) 97.6 F (36.4 C)  TempSrc: Oral  Oral Oral  SpO2: 99% 100% 96% 98%  Weight:   92.1 kg (203 lb 1.6 oz)     Height:        Intake/Output Summary (Last 24 hours) at 04/21/16 0958 Last data filed at 04/21/16 0700  Gross per 24 hour  Intake              448 ml  Output              825 ml  Net             -377 ml   Filed Weights   04/19/16 0543 04/19/16 2122 04/20/16 2100  Weight: 92.2 kg (203 lb 3.2 oz) 92 kg (202 lb 13.2 oz) 92.1 kg (203 lb 1.6 oz)    General appearance: alert, cooperative, appears stated age and no distress Resp: clear to auscultation bilaterally Cardio: regular rate and rhythm, S1, S2 normal, no murmur, click, rub or gallop GI: soft, non-tender; bowel sounds normal; no masses,  no organomegaly Extremities: extremities normal, atraumatic, no cyanosis or edema Neurologic: Awake and alert. Oriented to person, place. No facial asymmetry. Motor strength and equal bilateral upper and lower extremities.  Lab Results:  Data Reviewed: I have personally reviewed following labs and imaging studies  CBC:  Recent Labs Lab 04/16/16 1535 04/17/16 0509 04/17/16 0823 04/21/16 0535  WBC 8.4 10.7* 9.4 5.8  NEUTROABS 6.6  --  7.5  --   HGB 13.6 11.3* 10.2* 9.2*  HCT 40.1 32.2* 30.0* 26.7*  MCV 85.1 86.6 85.7 82.9  PLT 194 203 185 [id number]    Basic Metabolic Panel:  Recent Labs Lab 04/17/16 0823 04/18/16 1412 04/19/16 0512 04/20/16 0953 04/21/16 0535  NA  129* 127* 127* 128* 127*  K 4.0 3.8 4.0 4.0 3.9  CL 100* 98* 97* 96* 93*  CO2 19* 16* 14* 14* 14*  GLUCOSE 115* 150* 272* 184* 152*  BUN 45* 52* 63* 78* 94*  CREATININE 4.66* 5.81* 6.29* 7.27* 7.66*  CALCIUM 6.9* 6.7* 7.0* 7.3* 7.1*  PHOS  --   --  8.9*  --   --     GFR: Estimated Creatinine Clearance: 8.4 mL/min (by C-G formula based on SCr of 7.66 mg/dL (H)).  Liver Function Tests:  Recent Labs Lab 04/16/16 1535 04/17/16 0509 04/17/16 0823 04/19/16 0512  AST 41 19 16  --   ALT 30 28 24   --   ALKPHOS 378* 336* 304*  --   BILITOT 1.2 0.8 0.8  --   PROT 6.6 6.6 5.7*  --   ALBUMIN 2.7* 2.4* 2.2* 2.1*      Recent Labs Lab 04/16/16 1900  AMMONIA 11    Coagulation Profile:  Recent Labs Lab 04/16/16 1615 04/17/16 0823  INR 1.21 1.29    Cardiac Enzymes:  Recent Labs Lab 04/16/16 1537  CKTOTAL 92    HbA1C:  Recent Labs  04/19/16 0512  HGBA1C 6.5*    CBG:  Recent Labs Lab 04/20/16 0753 04/20/16 1110 04/20/16 1601 04/20/16 2113 04/21/16 0818  GLUCAP 176* 167* 140* 169* 135*     Recent Results (from the past 240 hour(s))  Culture, blood (Routine x 2)     Status: None (Preliminary result)   Collection Time: 04/16/16  3:32 PM  Result Value Ref Range Status   Specimen Description BLOOD RIGHT WRIST  Final   Special Requests BOTTLES DRAWN AEROBIC AND ANAEROBIC 5 CC EACH  Final   Culture   Final    NO GROWTH 4 DAYS Performed at [facility], [place] [address], [place], [place] [address]    Report Status PENDING  Incomplete  Culture, blood (Routine x 2)     Status: None (Preliminary result)   Collection Time: 04/16/16  4:15 PM  Result Value Ref Range Status   Specimen Description BLOOD RIGHT HAND  Final   Special Requests BOTTLES DRAWN AEROBIC ONLY 5 CC  Final   Culture   Final    NO GROWTH 4 DAYS Performed at [facility], [place] [address] [place], [place] [address]    Report Status PENDING  Incomplete  Urine culture     Status: None   Collection Time: 04/16/16  4:23 PM  Result Value Ref Range Status   Specimen Description URINE, RANDOM  Final   Special Requests NONE  Final   Culture   Final    NO GROWTH Performed at [facility], [place] [address], [place], [place] [address]    Report Status 04/18/2016 FINAL  Final  Culture, blood (x 2)     Status: None (Preliminary result)   Collection Time: 04/17/16  8:19 AM  Result Value Ref Range Status   Specimen Description BLOOD LEFT ANTECUBITAL  Final   Special Requests BOTTLES DRAWN AEROBIC AND ANAEROBIC 5CC  Final   Culture   Final    NO GROWTH 3 DAYS Performed at [facility],  [place] [address] [place], [place] [address]    Report Status PENDING  Incomplete  Culture, blood (x 2)     Status: None (Preliminary result)   Collection Time: 04/17/16  8:27 AM  Result Value Ref Range Status   Specimen Description BLOOD RIGHT HAND  Final   Special Requests BOTTLES DRAWN AEROBIC AND ANAEROBIC 5CC  Final   Culture   Final    NO GROWTH 3 DAYS Performed at [facility], [place] [address] [place], [place] [address]    Report Status PENDING  Incomplete      Radiology Studies: Mr Abdomen Mrcp Wo Contrast  Result Date: 04/19/2016 CLINICAL DATA:  Possible pancreatic mass on ultrasound. EXAM: MRI ABDOMEN WITHOUT CONTRAST  (INCLUDING MRCP) TECHNIQUE: Multiplanar multisequence MR imaging of the abdomen was performed. Heavily T2-weighted images of the biliary and pancreatic ducts were obtained, and three-dimensional MRCP images were rendered by post processing. IV contrast could not be given due to renal failure. COMPARISON:  04/17/2016 FINDINGS: Despite efforts by the technologist and patient, motion artifact is present on today's exam and could not be eliminated. This reduces exam sensitivity and specificity. Lower chest: Suspected mild atelectasis in the lower lobes. Hepatobiliary: Contracted gallbladder. Motion artifact precludes sensitive assessment of the biliary tree due to its severity, but there is no biliary dilatation. Pancreas: In the area of concern along the dorsal pancreas at the junction of the pancreatic body and tail, no abnormality is observed. The pancreas appears normal on MRI. Spleen:  Unremarkable Adrenals/Urinary Tract:  Unremarkable Stomach/Bowel: Unremarkable Vascular/Lymphatic:  Unremarkable Other:  No supplemental non-categorized findings. Musculoskeletal: Unremarkable IMPRESSION: 1. In the area of concern on ultrasound there is no abnormality on today' s noncontrast MRI. I suspect that the region was spurious or due to adjacent bowel. 2. Mild bibasilar atelectasis.  3. There is fairly severe motion artifact which precludes sensitive assessment of the biliary tree, but no biliary dilatation is observed. Electronically Signed   By: Van Clines M.D.   On: 04/19/2016 12:12   Mr 3d Recon At Scanner  Result Date: 04/19/2016 CLINICAL DATA:  Possible pancreatic mass on ultrasound. EXAM: MRI ABDOMEN WITHOUT CONTRAST  (INCLUDING MRCP) TECHNIQUE: Multiplanar multisequence MR imaging of the abdomen was performed. Heavily T2-weighted images of the biliary and pancreatic ducts were obtained, and three-dimensional MRCP images were rendered by post processing. IV contrast could not be given due to renal failure. COMPARISON:  04/17/2016 FINDINGS: Despite efforts by the technologist and patient, motion artifact is present on today's exam and could not be eliminated. This reduces exam sensitivity and specificity. Lower chest: Suspected mild atelectasis in the lower lobes. Hepatobiliary: Contracted gallbladder. Motion artifact precludes sensitive assessment of the biliary tree due to its severity, but there is no biliary dilatation. Pancreas: In the area of concern along the dorsal pancreas at the junction of the pancreatic body and tail, no abnormality is observed. The pancreas appears normal on MRI. Spleen:  Unremarkable Adrenals/Urinary Tract:  Unremarkable Stomach/Bowel: Unremarkable Vascular/Lymphatic:  Unremarkable Other:  No supplemental non-categorized findings. Musculoskeletal: Unremarkable IMPRESSION: 1. In the area of concern on ultrasound there is no abnormality on today' s noncontrast MRI. I suspect that the region was spurious or due to adjacent bowel. 2. Mild bibasilar atelectasis. 3. There is fairly severe motion artifact which precludes sensitive assessment of the biliary tree, but no biliary dilatation is observed. Electronically Signed   By: Van Clines M.D.   On: 04/19/2016 12:12   US Biopsy  Result Date: 04/20/2016 CLINICAL DATA:  Progressive renal failure  and need for renal biopsy. EXAM: ULTRASOUND GUIDED CORE BIOPSY OF RIGHT KIDNEY MEDICATIONS: 1.0 mg IV Versed; 25 mcg IV Fentanyl Total Moderate Sedation Time: 18 minutes. The patient's level of consciousness and physiologic status were continuously monitored during the procedure  by Radiology nursing. PROCEDURE: The procedure, risks, benefits, and alternatives were explained to the patient. Questions regarding the procedure were encouraged and answered. The patient understands and consents to the procedure. A time out was performed prior to initiating the procedure. Ultrasound was performed of both kidneys from a posterior approach. The right flank region was prepped with chlorhexidine in a sterile fashion, and a sterile drape was applied covering the operative field. A sterile gown and sterile gloves were used for the procedure. Local anesthesia was provided with 1% Lidocaine. Under ultrasound guidance, 2 separate core biopsy samples were obtained through lower pole cortex of the right kidney. COMPLICATIONS: None. FINDINGS: Both kidneys were visualized by ultrasound. The right kidney was more superficial in location and better visualized for biopsy. Solid tissue samples were obtained from lower pole cortex. Post biopsy imaging shows no immediate evidence of perinephric hemorrhage. IMPRESSION: Ultrasound-guided core biopsy performed of right lower pole renal cortex. Electronically Signed   By: Aletta Edouard M.D.   On: 04/20/2016 15:31     Medications:  Scheduled: . aspirin EC  81 mg Oral Daily  . atorvastatin  40 mg Oral q1800  . finasteride  5 mg Oral Daily  . gabapentin  100 mg Oral QHS  . insulin aspart  0-15 Units Subcutaneous TID WC  . insulin aspart  0-5 Units Subcutaneous QHS  . insulin aspart  4 Units Subcutaneous TID WC  . insulin detemir  10 Units Subcutaneous BID  . levothyroxine  200 mcg Oral QAC breakfast  . predniSONE  60 mg Oral Q breakfast  . 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 His creatinine was 1.0 about a year ago. Nephrology was consulted. 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 Autoimmune workup is positive for an elevated myeloperoxidase antibody. This is concerning for ANCA, glomerulonephritis. Patient transferred over to this facility for further management. Renal biopsy was performed 2/7. Creatinine continues to rise. At the same time he is making urine. Patient may need to undergo hemodialysis. Patient also started on high-dose steroids.   Sepsis of unclear source Culture data negative till date. Repeat chest x-ray is negative. Patient was initially started on the sepsis pathway. He was given vancomycin and Zosyn. Vancomycin was discontinued due to renal failure. No clear infectious foci. MRI brain did not show any infectious foci. MRI abdomen did not show any intra-abdominal process. WBC is normal. Zosyn was discontinued as well. Patient could've had a viral syndrome. MRI brain did show paranasal sinus disease and mastoid fluid. However patient has recently completed multiple courses of antibiotics for same. Continue to monitor off of antibiotics. Sepsis physiology, resolved.  Metabolic encephalopathy MRI brain did not show any acute process. Confusion could have been due to acute renal failure. Now appears to be close to baseline.  Abnormal transaminases Patient noted an elevated alkaline phosphatase. AST, ALT normal. Bilirubin normal. Abdominal ultrasound showed possible pancreatic mass. MRI did not  show any lesion in the pancreas. Finding on the ultrasound was thought to be artifactual from bowel. Outpatient follow-up for elevated alkaline phosphatase.  Hypothyroidism Continue Synthroid.  Hyponatremia Sodium level remains low but stable.  Hyperglycemia: No history of diabetes as outpatient. Likely due to steroids. Monitor CBGs. Continue insulin. HbA1c was 6.5.  DVT Prophylaxis: Subcutaneous heparin  Code Status: Full code  Family Communication: Discussed with patient and his son  Disposition Plan: Management as discussed above. Mobilize.    LOS: 5 days   [facility] Pager [phone] 04/21/2016, 9:58 AM  If 7PM-7AM, please contact night-coverage at [email or web address], password [facility]

## 2016-04-21 NOTE — Progress Notes (Signed)
  [facility] Progress Note   Subjective: creat continues to rise. Still nauseous, no vomiting or confusion.  No hematuira, has voided clear urine post biopsy yesterday.   Vitals:   04/20/16 1629 04/20/16 1846 04/20/16 2100 04/21/16 0500  BP: 120/60 133/74 139/79 (!) 144/68  Pulse: 72 66 72 64  Resp: 17 15    Temp: 97.4 F (36.3 C)  97.4 F (36.3 C) 97.6 F (36.4 C)  TempSrc: Oral  Oral Oral  SpO2: 99% 100% 96% 98%  Weight:   92.1 kg (203 lb 1.6 oz)   Height:        Inpatient medications: . aspirin EC  81 mg Oral Daily  . atorvastatin  40 mg Oral q1800  . finasteride  5 mg Oral Daily  . gabapentin  100 mg Oral QHS  . insulin aspart  0-15 Units Subcutaneous TID WC  . insulin aspart  0-5 Units Subcutaneous QHS  . insulin aspart  4 Units Subcutaneous TID WC  . insulin detemir  10 Units Subcutaneous BID  . levothyroxine  200 mcg Oral QAC breakfast  . predniSONE  60 mg Oral Q breakfast  . senna  1 tablet Oral BID    acetaminophen **OR** acetaminophen, ondansetron **OR** ondansetron (ZOFRAN) IV, traMADol, zolpidem  Exam: Gen alert, pleasant, no distress No jvd Chest clear bilat to bases RRR distant HS, no mrg Abd soft ntnd no mass or ascites +bs obese and tympanitic Ext improving LE edema 1+ Neuro is alert, Ox 3 , nf, no asterixis  Urine (by undersigned) > clear , yellow, 2+ blood and protein, 0-5 rbc/ wbc's per HPF.  Numerous granular casts, numerous degenerating cellular casts, +rbc casts and mixed rbc/ wbc casts.   Abd [place] > 13 cm kidneys, no hydro  Assessment: 1.  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 ANCA is + (MPO).  +sinus issues. Will await biopsy results but anticipate will need oral cytoxan.  Plan start HD tomorrow if creat still rising.  On po pred now, sp bolus steroids.  2.  BPH - no sign of hydro on [place] 3.  Volume - LE edema stable, up 8kg from admission   Plan -  as above   Kelly Splinter MD [place] pager [phone]   04/21/2016, 10:04 AM    Recent Labs Lab 04/19/16 0512 04/20/16 0953 04/21/16 0535  NA 127* 128* 127*  K 4.0 4.0 3.9  CL 97* 96* 93*  CO2 14* 14* 14*  GLUCOSE 272* 184* 152*  BUN 63* 78* 94*  CREATININE 6.29* 7.27* 7.66*  CALCIUM 7.0* 7.3* 7.1*  PHOS 8.9*  --   --     Recent Labs Lab 04/16/16 1535 04/17/16 0509 04/17/16 0823 04/19/16 0512  AST 41 19 16  --   ALT 30 28 24   --   ALKPHOS 378* 336* 304*  --   BILITOT 1.2 0.8 0.8  --   PROT 6.6 6.6 5.7*  --   ALBUMIN 2.7* 2.4* 2.2* 2.1*    Recent Labs Lab 04/16/16 1535 04/17/16 0509 04/17/16 0823 04/21/16 0535  WBC 8.4 10.7* 9.4 5.8  NEUTROABS 6.6  --  7.5  --   HGB 13.6 11.3* 10.2* 9.2*  HCT 40.1 32.2* 30.0* 26.7*  MCV 85.1 86.6 85.7 82.9  PLT 194 203 185 299   Iron/TIBC/Ferritin/ %Sat No results found for: IRON, TIBC, FERRITIN, IRONPCTSAT

## 2016-04-22 ENCOUNTER — Inpatient Hospital Stay (HOSPITAL_COMMUNITY): Payer: Medicare Other

## 2016-04-22 ENCOUNTER — Encounter (HOSPITAL_COMMUNITY): Payer: Self-pay | Admitting: General Surgery

## 2016-04-22 HISTORY — PX: IR GENERIC HISTORICAL: IMG1180011

## 2016-04-22 LAB — CULTURE, BLOOD (ROUTINE X 2)
CULTURE: NO GROWTH
Culture: NO GROWTH

## 2016-04-22 LAB — RENAL FUNCTION PANEL
Albumin: 1.9 g/dL — ABNORMAL LOW (ref 3.5–5.0)
Anion gap: 20 — ABNORMAL HIGH (ref 5–15)
BUN: 113 mg/dL — ABNORMAL HIGH (ref 6–20)
CALCIUM: 7.1 mg/dL — AB (ref 8.9–10.3)
CHLORIDE: 95 mmol/L — AB (ref 101–111)
CO2: 13 mmol/L — ABNORMAL LOW (ref 22–32)
CREATININE: 8.57 mg/dL — AB (ref 0.61–1.24)
GFR, EST AFRICAN AMERICAN: 6 mL/min — AB (ref >60)
GFR, EST NON AFRICAN AMERICAN: 5 mL/min — AB (ref >60)
Glucose, Bld: 143 mg/dL — ABNORMAL HIGH (ref 65–99)
Phosphorus: 12.3 mg/dL — ABNORMAL HIGH (ref 2.5–4.6)
Potassium: 4 mmol/L (ref 3.5–5.1)
SODIUM: 128 mmol/L — AB (ref 135–145)

## 2016-04-22 LAB — GLUCOSE, CAPILLARY
GLUCOSE-CAPILLARY: 135 mg/dL — AB (ref 65–99)
GLUCOSE-CAPILLARY: 134 mg/dL — AB (ref 65–99)
Glucose-Capillary: 159 mg/dL — ABNORMAL HIGH (ref 65–99)
Glucose-Capillary: 83 mg/dL (ref 65–99)

## 2016-04-22 MED ORDER — LIDOCAINE-PRILOCAINE 2.5-2.5 % EX CREA: 1.0000 "application " | TOPICAL_CREAM | CUTANEOUS | Status: DC | PRN

## 2016-04-22 MED ORDER — LIDOCAINE HCL (PF) 1 % IJ SOLN
INTRAMUSCULAR | Status: DC | PRN
  Administered 2016-04-22 (×2): 5 mL

## 2016-04-22 MED ORDER — FENTANYL CITRATE (PF) 100 MCG/2ML IJ SOLN
INTRAMUSCULAR | Status: DC | PRN
  Administered 2016-04-22 (×2): 50 ug via INTRAVENOUS

## 2016-04-22 MED ORDER — MIDAZOLAM HCL 2 MG/2ML IJ SOLN
INTRAMUSCULAR | Status: AC
  Filled 2016-04-22: qty 4

## 2016-04-22 MED ORDER — LIDOCAINE-EPINEPHRINE (PF) 1 %-1:200000 IJ SOLN
INTRAMUSCULAR | Status: AC
  Filled 2016-04-22: qty 30

## 2016-04-22 MED ORDER — CEFAZOLIN SODIUM-DEXTROSE 2-4 GM/100ML-% IV SOLN
INTRAVENOUS | Status: AC
  Administered 2016-04-22: 2000 mg
  Filled 2016-04-22: qty 100

## 2016-04-22 MED ORDER — HEPARIN SODIUM (PORCINE) 1000 UNIT/ML DIALYSIS: 1000.0000 [IU] | INTRAMUSCULAR | Status: DC | PRN

## 2016-04-22 MED ORDER — INSULIN DETEMIR 100 UNIT/ML ~~LOC~~ SOLN
5.0000 [IU] | Freq: Two times a day (BID) | SUBCUTANEOUS | Status: DC
  Administered 2016-04-22: 5 [IU] via SUBCUTANEOUS
  Filled 2016-04-22 (×2): qty 0.05

## 2016-04-22 MED ORDER — LIDOCAINE HCL (PF) 1 % IJ SOLN: 5.0000 mL | INTRAMUSCULAR | Status: DC | PRN

## 2016-04-22 MED ORDER — ALTEPLASE 2 MG IJ SOLR: 2.0000 mg | Freq: Once | INTRAMUSCULAR | Status: DC | PRN

## 2016-04-22 MED ORDER — MIDAZOLAM HCL 2 MG/2ML IJ SOLN
INTRAMUSCULAR | Status: DC | PRN
  Administered 2016-04-22: 0.5 mg via INTRAVENOUS
  Administered 2016-04-22: 1 mg via INTRAVENOUS

## 2016-04-22 MED ORDER — HEPARIN SODIUM (PORCINE) 1000 UNIT/ML IJ SOLN
INTRAMUSCULAR | Status: AC
  Filled 2016-04-22: qty 1

## 2016-04-22 MED ORDER — CEFAZOLIN IN D5W 1 GM/50ML IV SOLN
1.0000 g | INTRAVENOUS | Status: AC
  Administered 2016-04-22: 1 g via INTRAVENOUS
  Filled 2016-04-22: qty 50

## 2016-04-22 MED ORDER — SODIUM CHLORIDE 0.9 % IV SOLN: 100.0000 mL | INTRAVENOUS | Status: DC | PRN

## 2016-04-22 MED ORDER — PENTAFLUOROPROP-TETRAFLUOROETH EX AERO: 1.0000 "application " | INHALATION_SPRAY | CUTANEOUS | Status: DC | PRN

## 2016-04-22 MED ORDER — FENTANYL CITRATE (PF) 100 MCG/2ML IJ SOLN
INTRAMUSCULAR | Status: AC
  Filled 2016-04-22: qty 4

## 2016-04-22 NOTE — Procedures (Signed)
Successful placement of tunneled HD catheter with tips terminating within the superior aspect of the right atrium.   EBL: Minimal  The patient tolerated the procedure well without immediate post procedural complication.  The catheter is ready for immediate use.   Ronny Bacon, MD Pager #: [phone]

## 2016-04-22 NOTE — Progress Notes (Signed)
Nicholas Hudson Progress Note   Subjective: doing well, nauseous, no emesis, no abd pain or gross hematuria  Vitals:   04/22/16 1013 04/22/16 1050 04/22/16 1120 04/22/16 1200  BP: 120/65 138/69 134/63 134/68  Pulse: 74 64 (!) 57 (!) 58  Resp: 10 16    Temp:      TempSrc:      SpO2: 99% 98%    Weight:      Height:        Inpatient medications: . aspirin EC  81 mg Oral Daily  . atorvastatin  40 mg Oral q1800  . finasteride  5 mg Oral Daily  . gabapentin  100 mg Oral QHS  . heparin      . insulin aspart  0-15 Units Subcutaneous TID WC  . insulin aspart  0-5 Units Subcutaneous QHS  . insulin aspart  4 Units Subcutaneous TID WC  . insulin detemir  10 Units Subcutaneous BID  . levothyroxine  200 mcg Oral QAC breakfast  . predniSONE  60 mg Oral Q breakfast  . senna  1 tablet Oral BID    acetaminophen **OR** acetaminophen, fentaNYL, lidocaine (PF), midazolam, ondansetron **OR** ondansetron (ZOFRAN) IV, traMADol, zolpidem  Exam: Gen alert, pleasant, no distress, no asterixis No jvd Chest clear bilat to bases RRR distant HS, no mrg Abd soft ntnd no mass or ascites +bs obese and tympanitic Ext 1-2+ LE edema bilat Neuro is alert, Ox 3 , nf, no asterixis  Urine (by undersigned) > clear , yellow, 2+ blood and protein, 0-5 rbc/ wbc's per HPF.  Numerous granular casts, numerous degenerating cellular casts, +rbc casts and mixed rbc/ wbc casts.   Abd [place] > 13 cm kidneys, no hydro  Assessment: 1.  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 ANCA is + (MPO).  +sinus issues x 2 mos. Plan start HD today and tomorrow.  Anticipate biopsy results back today or Monday.  Will need maint Rx , decision after Bx back.  2.  BPH - no sign of hydro on [place] 3.  Volume - LE edema stable, up 8kg from admission 4.  SP renal biopsy 2/7 - hold all anticoagulants for now including DVT proph   Plan - as above   Kelly Splinter MD [facility] pager [phone]   04/22/2016, 12:59 PM    Recent Labs Lab 04/19/16 0512 04/20/16 0953 04/21/16 0535 04/22/16 0538  NA 127* 128* 127* 128*  K 4.0 4.0 3.9 4.0  CL 97* 96* 93* 95*  CO2 14* 14* 14* 13*  GLUCOSE 272* 184* 152* 143*  BUN 63* 78* 94* 113*  CREATININE 6.29* 7.27* 7.66* 8.57*  CALCIUM 7.0* 7.3* 7.1* 7.1*  PHOS 8.9*  --   --  12.3*    Recent Labs Lab 04/16/16 1535 04/17/16 0509 04/17/16 0823 04/19/16 0512 04/22/16 0538  AST 41 19 16  --   --   ALT 30 28 24   --   --   ALKPHOS 378* 336* 304*  --   --   BILITOT 1.2 0.8 0.8  --   --   PROT 6.6 6.6 5.7*  --   --   ALBUMIN 2.7* 2.4* 2.2* 2.1* 1.9*    Recent Labs Lab 04/16/16 1535 04/17/16 0509 04/17/16 0823 04/21/16 0535  WBC 8.4 10.7* 9.4 5.8  NEUTROABS 6.6  --  7.5  --   HGB 13.6 11.3* 10.2* 9.2*  HCT 40.1 32.2*  30.0* 26.7*  MCV 85.1 86.6 85.7 82.9  PLT 194 203 185 299   Iron/TIBC/Ferritin/ %Sat No results found for: IRON, TIBC, FERRITIN, IRONPCTSAT

## 2016-04-22 NOTE — Progress Notes (Signed)
Patient back from procedure. [place] Pink, MD paged regarding resuming patients previous diet. Will continue to monitor.

## 2016-04-22 NOTE — Sedation Documentation (Signed)
Heparin Flush charted by CT-Tech Juliet.

## 2016-04-22 NOTE — Progress Notes (Signed)
Patient ID: Nicholas Hudson, male   DOB: Feb 23, 1941, 76 y.o.   MRN: [id number]    Referring Physician(s): Dr. Roney Jaffe  Supervising Physician: Sandi Mariscal  Patient Status: [facility] - In-pt  Chief Complaint: Acute renal failure  Subjective: Patient with no complaints this morning.  No pain after bx yesterday and no hematuria.  Due to worsening renal failure we are asked to see the patient today for a tunneled HD cath.  Allergies: Patient has no known allergies.  Medications: Prior to Admission medications   Medication Sig Start Date End Date Taking? Authorizing Provider  allopurinol (ZYLOPRIM) 100 MG tablet Take 1 tablet (100 mg total) by mouth daily. 05/28/14  Yes Wallene Huh, DPM  aspirin EC 81 MG tablet Take 81 mg by mouth daily.   Yes Historical Provider, MD  cholecalciferol (VITAMIN D) 1000 UNITS tablet Take 2,000 Units by mouth daily.   Yes Historical Provider, MD  cyanocobalamin 2000 MCG tablet Take 2,000 mcg by mouth daily.   Yes Historical Provider, MD  finasteride (PROSCAR) 5 MG tablet Take 5 mg by mouth daily.   Yes Historical Provider, MD  gabapentin (NEURONTIN) 300 MG capsule Take 300 mg by mouth at bedtime. 04/14/16  Yes Historical Provider, MD  levothyroxine (SYNTHROID, LEVOTHROID) 200 MCG tablet Take 200 mcg by mouth daily before breakfast.  04/16/16  Yes Historical Provider, MD  Omega-3 Fatty Acids (FISH OIL) 1000 MG CAPS Take 1,000 mg by mouth daily.    Yes Historical Provider, MD  rosuvastatin (CRESTOR) 20 MG tablet Take 20 mg by mouth daily.   Yes Historical Provider, MD    Vital Signs: BP 134/67   Pulse 65   Temp 97.8 F (36.6 C)   Resp 18   Ht 5\' 5"  (1.651 m)   Wt 202 lb 13.2 oz (92 kg)   SpO2 100%   BMI 33.75 kg/m   Physical Exam: Heart: regular Lungs: CTAB Abd: soft, obese, NT  Imaging: Mr Abdomen Mrcp Wo Contrast  Result Date: 04/19/2016 CLINICAL DATA:  Possible pancreatic mass on ultrasound. EXAM: MRI ABDOMEN WITHOUT CONTRAST  (INCLUDING MRCP)  TECHNIQUE: Multiplanar multisequence MR imaging of the abdomen was performed. Heavily T2-weighted images of the biliary and pancreatic ducts were obtained, and three-dimensional MRCP images were rendered by post processing. IV contrast could not be given due to renal failure. COMPARISON:  04/17/2016 FINDINGS: Despite efforts by the technologist and patient, motion artifact is present on today's exam and could not be eliminated. This reduces exam sensitivity and specificity. Lower chest: Suspected mild atelectasis in the lower lobes. Hepatobiliary: Contracted gallbladder. Motion artifact precludes sensitive assessment of the biliary tree due to its severity, but there is no biliary dilatation. Pancreas: In the area of concern along the dorsal pancreas at the junction of the pancreatic body and tail, no abnormality is observed. The pancreas appears normal on MRI. Spleen:  Unremarkable Adrenals/Urinary Tract:  Unremarkable Stomach/Bowel: Unremarkable Vascular/Lymphatic:  Unremarkable Other:  No supplemental non-categorized findings. Musculoskeletal: Unremarkable IMPRESSION: 1. In the area of concern on ultrasound there is no abnormality on today' s noncontrast MRI. I suspect that the region was spurious or due to adjacent bowel. 2. Mild bibasilar atelectasis. 3. There is fairly severe motion artifact which precludes sensitive assessment of the biliary tree, but no biliary dilatation is observed. Electronically Signed   By: Van Clines M.D.   On: 04/19/2016 12:12   Mr 3d Recon At Scanner  Result Date: 04/19/2016 CLINICAL DATA:  Possible pancreatic mass on ultrasound.  EXAM: MRI ABDOMEN WITHOUT CONTRAST  (INCLUDING MRCP) TECHNIQUE: Multiplanar multisequence MR imaging of the abdomen was performed. Heavily T2-weighted images of the biliary and pancreatic ducts were obtained, and three-dimensional MRCP images were rendered by post processing. IV contrast could not be given due to renal failure. COMPARISON:   04/17/2016 FINDINGS: Despite efforts by the technologist and patient, motion artifact is present on today's exam and could not be eliminated. This reduces exam sensitivity and specificity. Lower chest: Suspected mild atelectasis in the lower lobes. Hepatobiliary: Contracted gallbladder. Motion artifact precludes sensitive assessment of the biliary tree due to its severity, but there is no biliary dilatation. Pancreas: In the area of concern along the dorsal pancreas at the junction of the pancreatic body and tail, no abnormality is observed. The pancreas appears normal on MRI. Spleen:  Unremarkable Adrenals/Urinary Tract:  Unremarkable Stomach/Bowel: Unremarkable Vascular/Lymphatic:  Unremarkable Other:  No supplemental non-categorized findings. Musculoskeletal: Unremarkable IMPRESSION: 1. In the area of concern on ultrasound there is no abnormality on today' s noncontrast MRI. I suspect that the region was spurious or due to adjacent bowel. 2. Mild bibasilar atelectasis. 3. There is fairly severe motion artifact which precludes sensitive assessment of the biliary tree, but no biliary dilatation is observed. Electronically Signed   By: Van Clines M.D.   On: 04/19/2016 12:12   US Biopsy  Result Date: 04/20/2016 CLINICAL DATA:  Progressive renal failure and need for renal biopsy. EXAM: ULTRASOUND GUIDED CORE BIOPSY OF RIGHT KIDNEY MEDICATIONS: 1.0 mg IV Versed; 25 mcg IV Fentanyl Total Moderate Sedation Time: 18 minutes. The patient's level of consciousness and physiologic status were continuously monitored during the procedure by Radiology nursing. PROCEDURE: The procedure, risks, benefits, and alternatives were explained to the patient. Questions regarding the procedure were encouraged and answered. The patient understands and consents to the procedure. A time out was performed prior to initiating the procedure. Ultrasound was performed of both kidneys from a posterior approach. The right flank region was  prepped with chlorhexidine in a sterile fashion, and a sterile drape was applied covering the operative field. A sterile gown and sterile gloves were used for the procedure. Local anesthesia was provided with 1% Lidocaine. Under ultrasound guidance, 2 separate core biopsy samples were obtained through lower pole cortex of the right kidney. COMPLICATIONS: None. FINDINGS: Both kidneys were visualized by ultrasound. The right kidney was more superficial in location and better visualized for biopsy. Solid tissue samples were obtained from lower pole cortex. Post biopsy imaging shows no immediate evidence of perinephric hemorrhage. IMPRESSION: Ultrasound-guided core biopsy performed of right lower pole renal cortex. Electronically Signed   By: Aletta Edouard M.D.   On: 04/20/2016 15:31    Labs:  CBC:  Recent Labs  04/16/16 1535 04/17/16 0509 04/17/16 0823 04/21/16 0535  WBC 8.4 10.7* 9.4 5.8  HGB 13.6 11.3* 10.2* 9.2*  HCT 40.1 32.2* 30.0* 26.7*  PLT 194 203 185 299    COAGS:  Recent Labs  04/16/16 1615 04/17/16 0823  INR 1.21 1.29  APTT  --  39*    BMP:  Recent Labs  04/19/16 0512 04/20/16 0953 04/21/16 0535 04/22/16 0538  NA 127* 128* 127* 128*  K 4.0 4.0 3.9 4.0  CL 97* 96* 93* 95*  CO2 14* 14* 14* 13*  GLUCOSE 272* 184* 152* 143*  BUN 63* 78* 94* 113*  CALCIUM 7.0* 7.3* 7.1* 7.1*  CREATININE 6.29* 7.27* 7.66* 8.57*  GFRNONAA 8* 6* 6* 5*  GFRAA 9* 7* 7* 6*  LIVER FUNCTION TESTS:  Recent Labs  04/16/16 1535 04/17/16 0509 04/17/16 0823 04/19/16 0512 04/22/16 0538  BILITOT 1.2 0.8 0.8  --   --   AST 41 19 16  --   --   ALT 30 28 24   --   --   ALKPHOS 378* 336* 304*  --   --   PROT 6.6 6.6 5.7*  --   --   ALBUMIN 2.7* 2.4* 2.2* 2.1* 1.9*    Assessment and Plan: 1. Acute renal failure -we will plan to pursue placement of a tunneled HD cath today -labs and vitals reviewed -Risks and Benefits discussed with the patient including, but not limited to  bleeding, infection, vascular injury, pneumothorax which may require chest tube placement, air embolism or even death All of the patient's questions were answered, patient is agreeable to proceed. Consent signed and in chart.   Electronically Signed: Henreitta Cea 04/22/2016, 8:02 AM   I spent a total of 15 Minutes at the the patient's bedside AND on the patient's hospital floor or unit, greater than 50% of which was counseling/coordinating care for acute renal failure

## 2016-04-22 NOTE — Sedation Documentation (Signed)
Patient is resting comfortably. 

## 2016-04-22 NOTE — Sedation Documentation (Signed)
Patient denies pain and is resting comfortably.  

## 2016-04-22 NOTE — Procedures (Signed)
Pt has home cpap machine but the machine has duck tape holding it together. Advised pt that because of the potential safety hazard, he could not use his machine.  RT informed the pt that he could use his mask but would need to used the hospital machine. Pt informed RT that he was fine and did not want to wear anything this evening.

## 2016-04-22 NOTE — Progress Notes (Signed)
Patient arrived to unit by bed.  Reviewed treatment plan and this RN agrees with plan.  Report received from bedside RN, Anisha.  Consent obtained.  Patient A & O X 4.   Lung sounds diminished to ausculation in all fields. Generalized edema. Cardiac:  NSR.  Removed caps and cleansed RIJ catheter with chlorhedxidine.  Aspirated ports of heparin and flushed them with saline per protocol.  Connected and secured lines, initiated treatment at 1539.  UF Goal of 2500 mL and net fluid removal 2 L.  Will continue to monitor.

## 2016-04-22 NOTE — Progress Notes (Signed)
Dialysis treatment completed.  2500 mL ultrafiltrated.  2000 mL net fluid removal.  Patient status unchanged. Lung sounds diminished to ausculation in all fields. No edema. Cardiac: NSR.  Cleansed RIJ catheter with chlorhexidine.  Disconnected lines and flushed ports with saline per protocol.  Ports locked with heparin and capped per protocol.    Report given to bedside, RN Anisha.

## 2016-04-22 NOTE — Progress Notes (Signed)
OT Cancellation Note  Patient Details Name: Nicholas Hudson MRN: [id number] DOB: [birth year]   Cancelled Treatment:    Reason Eval/Treat Not Completed: Patient at procedure or test/ unavailable. Pt in radiology for HD catheter placement. He will then proceed to HD. OT to re-attempt eval as time allows.  Tyrone Schimke OTR/L Pager: [phone]  04/22/2016, 9:58 AM

## 2016-04-22 NOTE — Progress Notes (Signed)
PT Cancellation Note  Patient Details Name: Nicholas Hudson MRN: [id number] DOB: 1940-06-22   Cancelled Treatment:    Reason Eval/Treat Not Completed: Patient at procedure or test/unavailable. Pt in radiology for HD catheter placement. He will then proceed to HD. PT to re-attempt eval as time allows.   Lorriane Shire 04/22/2016, 9:20 AM

## 2016-04-22 NOTE — Progress Notes (Signed)
[facility] PROGRESS NOTE  Nicholas Hudson VVO:[id number] DOB: 1940-10-30 DOA: 04/16/2016  PCP: Kandice Hams, MD  Brief History/Interval Summary: Nicholas Hudson is an 76 y.o. male with medical history significant of PBH, gout, recurrent sinus infection, hypothyroidism. Patient with history of sinus, polyps problems, infection for last 6 to 8 weeks, he has received 2 course of antibiotics by Dr Lucia Gaskins. Patient was found to have worsening renal function. He was hospitalized for further management.   Reason for Visit: Acute renal failure  Consultants: Nephrology  Procedures:  Renal biopsy 2/7  Placement of HD catheter 2/9  Echocardiogram Study Conclusions  - Left ventricle: The cavity size was normal. There was moderate   concentric hypertrophy. Systolic function was normal. The   estimated ejection fraction was in the range of 50% to 55%. Wall   motion was normal; there were no regional wall motion   abnormalities. Doppler parameters are consistent with abnormal   left ventricular relaxation (grade 1 diastolic dysfunction). - Aortic valve: Trileaflet; mildly thickened, mildly calcified   leaflets. - Aorta: Ascending aortic diameter: 42 mm (S). - Ascending aorta: The ascending aorta was mildly dilated. - Mitral valve: There was mild regurgitation. - Left atrium: The atrium was mildly dilated.  Antibiotics: Initially started on vancomycin and Zosyn. Both of these antibiotics have been discontinued.  Subjective/Interval History: Patient just returned from interventional radiology after having a hemodialysis catheter placed. He did not have any issues. Denies any shortness of breath.   ROS: Denies any nausea or vomiting  Objective:  Vital Signs  Vitals:   04/22/16 1050 04/22/16 1120 04/22/16 1200 04/22/16 1230  BP: 138/69 134/63 134/68 134/67  Pulse: 64 (!) 57 (!) 58 (!) 59  Resp: 16     Temp:      TempSrc:      SpO2: 98%     Weight:      Height:         Intake/Output Summary (Last 24 hours) at 04/22/16 1439 Last data filed at 04/22/16 1122  Gross per 24 hour  Intake              700 ml  Output             1002 ml  Net             -302 ml   Filed Weights   04/19/16 2122 04/20/16 2100 04/21/16 2104  Weight: 92 kg (202 lb 13.2 oz) 92.1 kg (203 lb 1.6 oz) 92 kg (202 lb 13.2 oz)    General appearance: alert, cooperative, appears stated age and no distress Resp: clear to auscultation bilaterally Cardio: regular rate and rhythm, S1, S2 normal, no murmur, click, rub or gallop GI: soft, non-tender; bowel sounds normal; no masses,  no organomegaly Extremities: extremities normal, atraumatic, no cyanosis or edema Neurologic: Awake and alert. Oriented to person, place. No facial asymmetry. Motor strength and equal bilateral upper and lower extremities.  Lab Results:  Data Reviewed: I have personally reviewed following labs and imaging studies  CBC:  Recent Labs Lab 04/16/16 1535 04/17/16 0509 04/17/16 0823 04/21/16 0535  WBC 8.4 10.7* 9.4 5.8  NEUTROABS 6.6  --  7.5  --   HGB 13.6 11.3* 10.2* 9.2*  HCT 40.1 32.2* 30.0* 26.7*  MCV 85.1 86.6 85.7 82.9  PLT 194 203 185 269    Basic Metabolic Panel:  Recent Labs Lab 04/18/16 1412 04/19/16 0512 04/20/16 0953 04/21/16 0535 04/22/16 0538  NA 127* 127* 128*  127* 128*  K 3.8 4.0 4.0 3.9 4.0  CL 98* 97* 96* 93* 95*  CO2 16* 14* 14* 14* 13*  GLUCOSE 150* 272* 184* 152* 143*  BUN 52* 63* 78* 94* 113*  CREATININE 5.81* 6.29* 7.27* 7.66* 8.57*  CALCIUM 6.7* 7.0* 7.3* 7.1* 7.1*  PHOS  --  8.9*  --   --  12.3*    GFR: Estimated Creatinine Clearance: 7.5 mL/min (by C-G formula based on SCr of 8.57 mg/dL (H)).  Liver Function Tests:  Recent Labs Lab 04/16/16 1535 04/17/16 0509 04/17/16 0823 04/19/16 0512 04/22/16 0538  AST 41 19 16  --   --   ALT '30 28 24  ' --   --   ALKPHOS 378* 336* 304*  --   --   BILITOT 1.2 0.8 0.8  --   --   PROT 6.6 6.6 5.7*  --   --    ALBUMIN 2.7* 2.4* 2.2* 2.1* 1.9*     Recent Labs Lab 04/16/16 1900  AMMONIA 11    Coagulation Profile:  Recent Labs Lab 04/16/16 1615 04/17/16 0823  INR 1.21 1.29    Cardiac Enzymes:  Recent Labs Lab 04/16/16 1537  CKTOTAL 92    HbA1C: No results for input(s): HGBA1C in the last 72 hours.  CBG:  Recent Labs Lab 04/21/16 1239 04/21/16 1729 04/21/16 2100 04/22/16 0751 04/22/16 1209  GLUCAP 191* 178* 215* 135* 159*     Recent Results (from the past 240 hour(s))  Culture, blood (Routine x 2)     Status: None   Collection Time: 04/16/16  3:32 PM  Result Value Ref Range Status   Specimen Description BLOOD RIGHT WRIST  Final   Special Requests BOTTLES DRAWN AEROBIC AND ANAEROBIC 5 CC EACH  Final   Culture   Final    NO GROWTH 5 DAYS Performed at [facility], [place] [address] [place], [place] [address]    Report Status 04/21/2016 FINAL  Final  Culture, blood (Routine x 2)     Status: None   Collection Time: 04/16/16  4:15 PM  Result Value Ref Range Status   Specimen Description BLOOD RIGHT HAND  Final   Special Requests BOTTLES DRAWN AEROBIC ONLY 5 CC  Final   Culture   Final    NO GROWTH 5 DAYS Performed at [facility], [place] [address] [place], [place] [address]    Report Status 04/21/2016 FINAL  Final  Urine culture     Status: None   Collection Time: 04/16/16  4:23 PM  Result Value Ref Range Status   Specimen Description URINE, RANDOM  Final   Special Requests NONE  Final   Culture   Final    NO GROWTH Performed at [facility], [place] [address] [place], [place] [address]    Report Status 04/18/2016 FINAL  Final  Culture, blood (x 2)     Status: None   Collection Time: 04/17/16  8:19 AM  Result Value Ref Range Status   Specimen Description BLOOD LEFT ANTECUBITAL  Final   Special Requests BOTTLES DRAWN AEROBIC AND ANAEROBIC 5CC  Final   Culture   Final    NO GROWTH 5 DAYS Performed at [facility], [place] [address] [place], [place] [address]    Report Status 04/22/2016 FINAL  Final  Culture, blood (x 2)     Status: None   Collection Time: 04/17/16  8:27 AM  Result Value Ref Range Status  Specimen Description BLOOD RIGHT HAND  Final   Special Requests BOTTLES DRAWN AEROBIC AND ANAEROBIC 5CC  Final   Culture   Final    NO GROWTH 5 DAYS Performed at [facility], [place] [address], [place], [place] [address]    Report Status 04/22/2016 FINAL  Final      Radiology Studies: US Biopsy  Result Date: 2016-04-29 CLINICAL DATA:  Progressive renal failure and need for renal biopsy. EXAM: ULTRASOUND GUIDED CORE BIOPSY OF RIGHT KIDNEY MEDICATIONS: 1.0 mg IV Versed; 25 mcg IV Fentanyl Total Moderate Sedation Time: 18 minutes. The patient's level of consciousness and physiologic status were continuously monitored during the procedure by Radiology nursing. PROCEDURE: The procedure, risks, benefits, and alternatives were explained to the patient. Questions regarding the procedure were encouraged and answered. The patient understands and consents to the procedure. A time out was performed prior to initiating the procedure. Ultrasound was performed of both kidneys from a posterior approach. The right flank region was prepped with chlorhexidine in a sterile fashion, and a sterile drape was applied covering the operative field. A sterile gown and sterile gloves were used for the procedure. Local anesthesia was provided with 1% Lidocaine. Under ultrasound guidance, 2 separate core biopsy samples were obtained through lower pole cortex of the right kidney. COMPLICATIONS: None. FINDINGS: Both kidneys were visualized by ultrasound. The right kidney was more superficial in location and better visualized for biopsy. Solid tissue samples were obtained from lower pole cortex. Post biopsy imaging shows no immediate evidence of perinephric hemorrhage. IMPRESSION: Ultrasound-guided core biopsy performed of right lower pole renal cortex.  Electronically Signed   By: Aletta Edouard M.D.   On: 04-29-2016 15:31   Ir Fluoro Guide Cv Line Right  Result Date: 04/22/2016 INDICATION: End-stage renal disease. In in need of intravenous access for the initiation of dialysis. EXAM: TUNNELED CENTRAL VENOUS HEMODIALYSIS CATHETER PLACEMENT WITH ULTRASOUND AND FLUOROSCOPIC GUIDANCE MEDICATIONS: Ancef 2 gm IV . The antibiotic was given in an appropriate time interval prior to skin puncture. ANESTHESIA/SEDATION: Versed 1 mg IV; Fentanyl 50 mcg IV; Moderate Sedation Time:  18 The patient was continuously monitored during the procedure by the interventional radiology nurse under my direct supervision. FLUOROSCOPY TIME:  18 seconds (3 mGy). COMPLICATIONS: None immediate. PROCEDURE: Informed written consent was obtained from the patient after a discussion of the risks, benefits, and alternatives to treatment. Questions regarding the procedure were encouraged and answered. The right neck and chest were prepped with chlorhexidine in a sterile fashion, and a sterile drape was applied covering the operative field. Maximum barrier sterile technique with sterile gowns and gloves were used for the procedure. A timeout was performed prior to the initiation of the procedure. After creating a small venotomy incision, a micropuncture kit was utilized to access the right internal jugular vein under direct, real-time ultrasound guidance after the overlying soft tissues were anesthetized with 1% lidocaine with epinephrine. Ultrasound image documentation was performed. The microwire was kinked to measure appropriate catheter length. A stiff Glidewire was advanced to the level of the IVC and the micropuncture sheath was exchanged for a peel-away sheath. A palindrome tunneled hemodialysis catheter measuring 19 cm from tip to cuff was tunneled in a retrograde fashion from the anterior chest wall to the venotomy incision. The catheter was then placed through the peel-away sheath with  tips ultimately positioned within the superior aspect of the right atrium. Final catheter positioning was confirmed and documented with a spot radiographic image. The catheter aspirates  and flushes normally. The catheter was flushed with appropriate volume heparin dwells. The catheter exit site was secured with a 0-Prolene retention suture. The venotomy incision was closed with an interrupted 4-0 Vicryl, Dermabond and Steri-strips. Dressings were applied. The patient tolerated the procedure well without immediate post procedural complication. IMPRESSION: Successful placement of 19 cm tip to cuff tunneled hemodialysis catheter via the right internal jugular vein with tips terminating within the superior aspect of the right atrium. The catheter is ready for immediate use. Electronically Signed   By: Sandi Mariscal M.D.   On: 04/22/2016 11:05   Ir US Guide Vasc Access Right  Result Date: 04/22/2016 INDICATION: End-stage renal disease. In in need of intravenous access for the initiation of dialysis. EXAM: TUNNELED CENTRAL VENOUS HEMODIALYSIS CATHETER PLACEMENT WITH ULTRASOUND AND FLUOROSCOPIC GUIDANCE MEDICATIONS: Ancef 2 gm IV . The antibiotic was given in an appropriate time interval prior to skin puncture. ANESTHESIA/SEDATION: Versed 1 mg IV; Fentanyl 50 mcg IV; Moderate Sedation Time:  18 The patient was continuously monitored during the procedure by the interventional radiology nurse under my direct supervision. FLUOROSCOPY TIME:  18 seconds (3 mGy). COMPLICATIONS: None immediate. PROCEDURE: Informed written consent was obtained from the patient after a discussion of the risks, benefits, and alternatives to treatment. Questions regarding the procedure were encouraged and answered. The right neck and chest were prepped with chlorhexidine in a sterile fashion, and a sterile drape was applied covering the operative field. Maximum barrier sterile technique with sterile gowns and gloves were used for the procedure. A  timeout was performed prior to the initiation of the procedure. After creating a small venotomy incision, a micropuncture kit was utilized to access the right internal jugular vein under direct, real-time ultrasound guidance after the overlying soft tissues were anesthetized with 1% lidocaine with epinephrine. Ultrasound image documentation was performed. The microwire was kinked to measure appropriate catheter length. A stiff Glidewire was advanced to the level of the IVC and the micropuncture sheath was exchanged for a peel-away sheath. A palindrome tunneled hemodialysis catheter measuring 19 cm from tip to cuff was tunneled in a retrograde fashion from the anterior chest wall to the venotomy incision. The catheter was then placed through the peel-away sheath with tips ultimately positioned within the superior aspect of the right atrium. Final catheter positioning was confirmed and documented with a spot radiographic image. The catheter aspirates and flushes normally. The catheter was flushed with appropriate volume heparin dwells. The catheter exit site was secured with a 0-Prolene retention suture. The venotomy incision was closed with an interrupted 4-0 Vicryl, Dermabond and Steri-strips. Dressings were applied. The patient tolerated the procedure well without immediate post procedural complication. IMPRESSION: Successful placement of 19 cm tip to cuff tunneled hemodialysis catheter via the right internal jugular vein with tips terminating within the superior aspect of the right atrium. The catheter is ready for immediate use. Electronically Signed   By: Sandi Mariscal M.D.   On: 04/22/2016 11:05     Medications:  Scheduled: . aspirin EC  81 mg Oral Daily  . atorvastatin  40 mg Oral q1800  . finasteride  5 mg Oral Daily  . gabapentin  100 mg Oral QHS  . heparin      . insulin aspart  0-15 Units Subcutaneous TID WC  . insulin aspart  0-5 Units Subcutaneous QHS  . insulin aspart  4 Units Subcutaneous  TID WC  . insulin detemir  10 Units Subcutaneous BID  . levothyroxine  200 mcg  Oral QAC breakfast  . predniSONE  60 mg Oral Q breakfast  . 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 His creatinine was 1.0 about a year ago. Nephrology was consulted. 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 Autoimmune workup is positive for an elevated myeloperoxidase antibody. This is concerning for ANCA, glomerulonephritis. Patient transferred over to this facility for further management. Renal biopsy was performed 2/7. Creatinine continues to rise. At the same time he is making urine. Nephrology is following. Plan is for hemodialysis today. Patient was given high-dose steroids. Now on oral prednisone.  Sepsis of unclear source Culture data negative till date. Repeat chest x-ray is negative. Patient was initially started on the sepsis pathway. He was given vancomycin and Zosyn. Vancomycin was discontinued due to renal failure. No clear infectious foci. MRI brain did not show any infectious foci. MRI abdomen did not show any intra-abdominal process. WBC is normal. Zosyn was discontinued as well. Patient could've had a viral syndrome. MRI brain did show paranasal sinus disease and mastoid fluid. However patient has recently completed multiple courses of antibiotics for same. Continue to monitor off of antibiotics. Sepsis physiology, resolved.  Metabolic encephalopathy MRI brain did not show any acute process. Confusion could have been due to  acute renal failure. Now appears to be close to baseline.  Elevated alkaline phosphatase Patient noted an elevated alkaline phosphatase. AST, ALT normal. Bilirubin normal. Abdominal ultrasound showed possible pancreatic mass. MRI did not show any lesion in the pancreas. Finding on the ultrasound was thought to be artifactual from bowel. Outpatient follow-up for elevated alkaline phosphatase.  Hypothyroidism Continue Synthroid.  Hyponatremia Sodium level remains low but stable.  Hyperglycemia: No history of diabetes as outpatient. Hyperglycemia is likely due to steroids. Monitor CBGs. Continue insulin. HbA1c was 6.5. May need to cut back on insulin as steroid is tapered down.  DVT Prophylaxis: Subcutaneous heparin  Code Status: Full code  Family Communication: Discussed with patient and his son  Disposition Plan: Management as discussed above. Mobilize.    LOS: 6 days   [facility] Pager [phone] 04/22/2016, 2:39 PM  If 7PM-7AM, please contact night-coverage at [email or web address], password [facility]

## 2016-04-23 DIAGNOSIS — T380X5A Adverse effect of glucocorticoids and synthetic analogues, initial encounter: Secondary | ICD-10-CM

## 2016-04-23 LAB — RENAL FUNCTION PANEL
ALBUMIN: 1.7 g/dL — AB (ref 3.5–5.0)
ANION GAP: 17 — AB (ref 5–15)
BUN: 78 mg/dL — ABNORMAL HIGH (ref 6–20)
CALCIUM: 7 mg/dL — AB (ref 8.9–10.3)
CO2: 21 mmol/L — ABNORMAL LOW (ref 22–32)
Chloride: 96 mmol/L — ABNORMAL LOW (ref 101–111)
Creatinine, Ser: 6.44 mg/dL — ABNORMAL HIGH (ref 0.61–1.24)
GFR calc Af Amer: 9 mL/min — ABNORMAL LOW (ref >60)
GFR calc non Af Amer: 7 mL/min — ABNORMAL LOW (ref >60)
GLUCOSE: 75 mg/dL (ref 65–99)
PHOSPHORUS: 8.4 mg/dL — AB (ref 2.5–4.6)
Potassium: 3.6 mmol/L (ref 3.5–5.1)
SODIUM: 134 mmol/L — AB (ref 135–145)

## 2016-04-23 LAB — GLUCOSE, CAPILLARY
GLUCOSE-CAPILLARY: 86 mg/dL (ref 65–99)
GLUCOSE-CAPILLARY: 178 mg/dL — AB (ref 65–99)
Glucose-Capillary: 117 mg/dL — ABNORMAL HIGH (ref 65–99)
Glucose-Capillary: 212 mg/dL — ABNORMAL HIGH (ref 65–99)

## 2016-04-23 LAB — CBC
HCT: 29.2 % — ABNORMAL LOW (ref 39.0–52.0)
HEMOGLOBIN: 10.1 g/dL — AB (ref 13.0–17.0)
MCH: 28.9 pg (ref 26.0–34.0)
MCHC: 34.6 g/dL (ref 30.0–36.0)
MCV: 83.4 fL (ref 78.0–100.0)
PLATELETS: 308 10*3/uL (ref 150–400)
RBC: 3.5 MIL/uL — ABNORMAL LOW (ref 4.22–5.81)
RDW: 13.4 % (ref 11.5–15.5)
WBC: 7.5 10*3/uL (ref 4.0–10.5)

## 2016-04-23 MED ORDER — SODIUM CHLORIDE 0.9 % IV SOLN: 100.0000 mL | INTRAVENOUS | Status: DC | PRN

## 2016-04-23 MED ORDER — INSULIN DETEMIR 100 UNIT/ML ~~LOC~~ SOLN
5.0000 [IU] | Freq: Every day | SUBCUTANEOUS | Status: DC
  Administered 2016-04-24 – 2016-04-29 (×6): 5 [IU] via SUBCUTANEOUS
  Filled 2016-04-23 (×6): qty 0.05

## 2016-04-23 MED ORDER — HEPARIN SODIUM (PORCINE) 1000 UNIT/ML DIALYSIS: 1000.0000 [IU] | INTRAMUSCULAR | Status: DC | PRN

## 2016-04-23 MED ORDER — PENTAFLUOROPROP-TETRAFLUOROETH EX AERO
1.0000 | INHALATION_SPRAY | CUTANEOUS | Status: DC | PRN
Start: 2016-04-23 — End: 2016-04-23

## 2016-04-23 MED ORDER — CYCLOPHOSPHAMIDE 50 MG PO TABS: 100.0000 mg | ORAL_TABLET | Freq: Every day | ORAL | Status: DC

## 2016-04-23 MED ORDER — LIDOCAINE-PRILOCAINE 2.5-2.5 % EX CREA: 1.0000 "application " | TOPICAL_CREAM | CUTANEOUS | Status: DC | PRN

## 2016-04-23 MED ORDER — ALTEPLASE 2 MG IJ SOLR: 2.0000 mg | Freq: Once | INTRAMUSCULAR | Status: DC | PRN

## 2016-04-23 MED ORDER — CYCLOPHOSPHAMIDE 25 MG PO TABS
75.0000 mg | ORAL_TABLET | Freq: Every day | ORAL | Status: DC
  Administered 2016-04-23 – 2016-04-28 (×6): 75 mg via ORAL
  Filled 2016-04-23 (×6): qty 3

## 2016-04-23 MED ORDER — SULFAMETHOXAZOLE-TRIMETHOPRIM 800-160 MG PO TABS
1.0000 | ORAL_TABLET | ORAL | Status: DC
  Administered 2016-04-25 – 2016-05-13 (×8): 1 via ORAL
  Filled 2016-04-23 (×10): qty 1

## 2016-04-23 MED ORDER — LIDOCAINE HCL (PF) 1 % IJ SOLN: 5.0000 mL | INTRAMUSCULAR | Status: DC | PRN

## 2016-04-23 NOTE — Progress Notes (Signed)
Patient has the CPAP machine that he wears at home.  However, this machine has duct tape on in and appears to be in poor repair.  He has been asked not to use this machine.  Patient was offered a hospital machine but declined.  Patient also states that he has a newer machine at home but doesn't like to use it because "it blows my head off."  Patient was advised to contact his home health company and have the new machine's setting adjusted.

## 2016-04-23 NOTE — Progress Notes (Signed)
[facility] PROGRESS NOTE  Nicholas Hudson NLG:[id number] DOB: June 08, 1940 DOA: 04/16/2016  PCP: Kandice Hams, MD  Brief History/Interval Summary: Nicholas Hudson is an 76 y.o. male with medical history significant of PBH, gout, recurrent sinus infection, hypothyroidism. Patient with history of sinus, polyps problems, infection for last 6 to 8 weeks, he has received 2 course of antibiotics by Dr Lucia Gaskins. Patient was found to have worsening renal function. He was hospitalized for further management.   Reason for Visit: Acute renal failure  Consultants: Nephrology  Procedures:  Renal biopsy 2/7  Placement of HD catheter 2/9  Echocardiogram Study Conclusions  - Left ventricle: The cavity size was normal. There was moderate   concentric hypertrophy. Systolic function was normal. The   estimated ejection fraction was in the range of 50% to 55%. Wall   motion was normal; there were no regional wall motion   abnormalities. Doppler parameters are consistent with abnormal   left ventricular relaxation (grade 1 diastolic dysfunction). - Aortic valve: Trileaflet; mildly thickened, mildly calcified   leaflets. - Aorta: Ascending aortic diameter: 42 mm (S). - Ascending aorta: The ascending aorta was mildly dilated. - Mitral valve: There was mild regurgitation. - Left atrium: The atrium was mildly dilated.  Antibiotics: Initially started on vancomycin and Zosyn. Both of these antibiotics have been discontinued.  Subjective/Interval History: Patient underwent dialysis yesterday. Denies any problems overnight. Denies any shortness of breath.    ROS: Denies any nausea or vomiting  Objective:  Vital Signs  Vitals:   04/22/16 1855 04/22/16 2144 04/23/16 0627 04/23/16 0848  BP: (!) 109/55 (!) 117/59 (!) 113/91 (!) 128/56  Pulse: 61 62 68 65  Resp:  '16 16 16  ' Temp:  97.6 F (36.4 C) 97.5 F (36.4 C) 97.8 F (36.6 C)  TempSrc:  Oral Oral Oral  SpO2:  100% 97% 96%  Weight:   88.4 kg (194 lb 14.4 oz)    Height:        Intake/Output Summary (Last 24 hours) at 04/23/16 0814 Last data filed at 04/23/16 0600  Gross per 24 hour  Intake              480 ml  Output             2600 ml  Net            -2120 ml   Filed Weights   04/22/16 1533 04/22/16 1809 04/22/16 2144  Weight: 92.1 kg (203 lb 0.7 oz) 90 kg (198 lb 6.6 oz) 88.4 kg (194 lb 14.4 oz)    General appearance: alert, cooperative, appears stated age and no distress Resp: clear to auscultation bilaterally Cardio: regular rate and rhythm, S1, S2 normal, no murmur, click, rub or gallop GI: soft, non-tender; bowel sounds normal; no masses,  no organomegaly Extremities: Does have some edema bilateral lower extremity Neurologic: Awake and alert. Oriented to person, place. No facial asymmetry. Motor strength and equal bilateral upper and lower extremities.  Lab Results:  Data Reviewed: I have personally reviewed following labs and imaging studies  CBC:  Recent Labs Lab 04/16/16 1535 04/17/16 0509 04/17/16 0823 04/21/16 0535 04/23/16 0519  WBC 8.4 10.7* 9.4 5.8 7.5  NEUTROABS 6.6  --  7.5  --   --   HGB 13.6 11.3* 10.2* 9.2* 10.1*  HCT 40.1 32.2* 30.0* 26.7* 29.2*  MCV 85.1 86.6 85.7 82.9 83.4  PLT 194 203 185 299 481    Basic Metabolic Panel:  Recent Labs Lab  04/19/16 4585 04/20/16 9292 04/21/16 0535 04/22/16 0538 04/23/16 0519  NA 127* 128* 127* 128* 134*  K 4.0 4.0 3.9 4.0 3.6  CL 97* 96* 93* 95* 96*  CO2 14* 14* 14* 13* 21*  GLUCOSE 272* 184* 152* 143* 75  BUN 63* 78* 94* 113* 78*  CREATININE 6.29* 7.27* 7.66* 8.57* 6.44*  CALCIUM 7.0* 7.3* 7.1* 7.1* 7.0*  PHOS 8.9*  --   --  12.3* 8.4*    GFR: Estimated Creatinine Clearance: 9.8 mL/min (by C-G formula based on SCr of 6.44 mg/dL (H)).  Liver Function Tests:  Recent Labs Lab 04/16/16 1535 04/17/16 0509 04/17/16 0823 04/19/16 0512 04/22/16 0538 04/23/16 0519  AST 41 19 16  --   --   --   ALT '30 28 24  ' --   --   --     ALKPHOS 378* 336* 304*  --   --   --   BILITOT 1.2 0.8 0.8  --   --   --   PROT 6.6 6.6 5.7*  --   --   --   ALBUMIN 2.7* 2.4* 2.2* 2.1* 1.9* 1.7*     Recent Labs Lab 04/16/16 1900  AMMONIA 11    Coagulation Profile:  Recent Labs Lab 04/16/16 1615 04/17/16 0823  INR 1.21 1.29    Cardiac Enzymes:  Recent Labs Lab 04/16/16 1537  CKTOTAL 92    HbA1C: No results for input(s): HGBA1C in the last 72 hours.  CBG:  Recent Labs Lab 04/22/16 0751 04/22/16 1209 04/22/16 1853 04/22/16 2139 04/23/16 0801  GLUCAP 135* 159* 83 134* 86     Recent Results (from the past 240 hour(s))  Culture, blood (Routine x 2)     Status: None   Collection Time: 04/16/16  3:32 PM  Result Value Ref Range Status   Specimen Description BLOOD RIGHT WRIST  Final   Special Requests BOTTLES DRAWN AEROBIC AND ANAEROBIC 5 CC EACH  Final   Culture   Final    NO GROWTH 5 DAYS Performed at [facility], [address] [place], [place] [address]    Report Status 04/21/2016 FINAL  Final  Culture, blood (Routine x 2)     Status: None   Collection Time: 04/16/16  4:15 PM  Result Value Ref Range Status   Specimen Description BLOOD RIGHT HAND  Final   Special Requests BOTTLES DRAWN AEROBIC ONLY 5 CC  Final   Culture   Final    NO GROWTH 5 DAYS Performed at [facility], [place] [address] [place], [place] [address]    Report Status 04/21/2016 FINAL  Final  Urine culture     Status: None   Collection Time: 04/16/16  4:23 PM  Result Value Ref Range Status   Specimen Description URINE, RANDOM  Final   Special Requests NONE  Final   Culture   Final    NO GROWTH Performed at [facility], [place] [address] [place], [place] [address]    Report Status 04/18/2016 FINAL  Final  Culture, blood (x 2)     Status: None   Collection Time: 04/17/16  8:19 AM  Result Value Ref Range Status   Specimen Description BLOOD LEFT ANTECUBITAL  Final   Special Requests BOTTLES DRAWN AEROBIC AND  ANAEROBIC 5CC  Final   Culture   Final    NO GROWTH 5 DAYS Performed at [facility], [place] [address] [place], [place] [address]  Report Status 04/22/2016 FINAL  Final  Culture, blood (x 2)     Status: None   Collection Time: 04/17/16  8:27 AM  Result Value Ref Range Status   Specimen Description BLOOD RIGHT HAND  Final   Special Requests BOTTLES DRAWN AEROBIC AND ANAEROBIC 5CC  Final   Culture   Final    NO GROWTH 5 DAYS Performed at [facility], [place] [address], [place], [place] [address]    Report Status 04/22/2016 FINAL  Final      Radiology Studies: Ir Fluoro Guide Cv Line Right  Result Date: 04/22/2016 INDICATION: End-stage renal disease. In in need of intravenous access for the initiation of dialysis. EXAM: TUNNELED CENTRAL VENOUS HEMODIALYSIS CATHETER PLACEMENT WITH ULTRASOUND AND FLUOROSCOPIC GUIDANCE MEDICATIONS: Ancef 2 gm IV . The antibiotic was given in an appropriate time interval prior to skin puncture. ANESTHESIA/SEDATION: Versed 1 mg IV; Fentanyl 50 mcg IV; Moderate Sedation Time:  18 The patient was continuously monitored during the procedure by the interventional radiology nurse under my direct supervision. FLUOROSCOPY TIME:  18 seconds (3 mGy). COMPLICATIONS: None immediate. PROCEDURE: Informed written consent was obtained from the patient after a discussion of the risks, benefits, and alternatives to treatment. Questions regarding the procedure were encouraged and answered. The right neck and chest were prepped with chlorhexidine in a sterile fashion, and a sterile drape was applied covering the operative field. Maximum barrier sterile technique with sterile gowns and gloves were used for the procedure. A timeout was performed prior to the initiation of the procedure. After creating a small venotomy incision, a micropuncture kit was utilized to access the right internal jugular vein under direct, real-time ultrasound guidance after the overlying soft tissues  were anesthetized with 1% lidocaine with epinephrine. Ultrasound image documentation was performed. The microwire was kinked to measure appropriate catheter length. A stiff Glidewire was advanced to the level of the IVC and the micropuncture sheath was exchanged for a peel-away sheath. A palindrome tunneled hemodialysis catheter measuring 19 cm from tip to cuff was tunneled in a retrograde fashion from the anterior chest wall to the venotomy incision. The catheter was then placed through the peel-away sheath with tips ultimately positioned within the superior aspect of the right atrium. Final catheter positioning was confirmed and documented with a spot radiographic image. The catheter aspirates and flushes normally. The catheter was flushed with appropriate volume heparin dwells. The catheter exit site was secured with a 0-Prolene retention suture. The venotomy incision was closed with an interrupted 4-0 Vicryl, Dermabond and Steri-strips. Dressings were applied. The patient tolerated the procedure well without immediate post procedural complication. IMPRESSION: Successful placement of 19 cm tip to cuff tunneled hemodialysis catheter via the right internal jugular vein with tips terminating within the superior aspect of the right atrium. The catheter is ready for immediate use. Electronically Signed   By: Sandi Mariscal M.D.   On: 04/22/2016 11:05   Ir US Guide Vasc Access Right  Result Date: 04/22/2016 INDICATION: End-stage renal disease. In in need of intravenous access for the initiation of dialysis. EXAM: TUNNELED CENTRAL VENOUS HEMODIALYSIS CATHETER PLACEMENT WITH ULTRASOUND AND FLUOROSCOPIC GUIDANCE MEDICATIONS: Ancef 2 gm IV . The antibiotic was given in an appropriate time interval prior to skin puncture. ANESTHESIA/SEDATION: Versed 1 mg IV; Fentanyl 50 mcg IV; Moderate Sedation Time:  18 The patient was continuously monitored during the procedure by the interventional radiology nurse under my direct  supervision. FLUOROSCOPY TIME:  18 seconds (3 mGy). COMPLICATIONS: None  immediate. PROCEDURE: Informed written consent was obtained from the patient after a discussion of the risks, benefits, and alternatives to treatment. Questions regarding the procedure were encouraged and answered. The right neck and chest were prepped with chlorhexidine in a sterile fashion, and a sterile drape was applied covering the operative field. Maximum barrier sterile technique with sterile gowns and gloves were used for the procedure. A timeout was performed prior to the initiation of the procedure. After creating a small venotomy incision, a micropuncture kit was utilized to access the right internal jugular vein under direct, real-time ultrasound guidance after the overlying soft tissues were anesthetized with 1% lidocaine with epinephrine. Ultrasound image documentation was performed. The microwire was kinked to measure appropriate catheter length. A stiff Glidewire was advanced to the level of the IVC and the micropuncture sheath was exchanged for a peel-away sheath. A palindrome tunneled hemodialysis catheter measuring 19 cm from tip to cuff was tunneled in a retrograde fashion from the anterior chest wall to the venotomy incision. The catheter was then placed through the peel-away sheath with tips ultimately positioned within the superior aspect of the right atrium. Final catheter positioning was confirmed and documented with a spot radiographic image. The catheter aspirates and flushes normally. The catheter was flushed with appropriate volume heparin dwells. The catheter exit site was secured with a 0-Prolene retention suture. The venotomy incision was closed with an interrupted 4-0 Vicryl, Dermabond and Steri-strips. Dressings were applied. The patient tolerated the procedure well without immediate post procedural complication. IMPRESSION: Successful placement of 19 cm tip to cuff tunneled hemodialysis catheter via the right  internal jugular vein with tips terminating within the superior aspect of the right atrium. The catheter is ready for immediate use. Electronically Signed   By: Sandi Mariscal M.D.   On: 04/22/2016 11:05     Medications:  Scheduled: . aspirin EC  81 mg Oral Daily  . atorvastatin  40 mg Oral q1800  . finasteride  5 mg Oral Daily  . gabapentin  100 mg Oral QHS  . insulin aspart  0-15 Units Subcutaneous TID WC  . insulin aspart  0-5 Units Subcutaneous QHS  . insulin aspart  4 Units Subcutaneous TID WC  . insulin detemir  5 Units Subcutaneous BID  . levothyroxine  200 mcg Oral QAC breakfast  . predniSONE  60 mg Oral Q breakfast  . 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 His creatinine was 1.0 about a year ago. Nephrology was consulted. Autoimmune workup is positive for an elevated myeloperoxidase antibody. 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 Patient transferred over to this facility for further management. Renal biopsy was performed 2/7. Creatinine continued to rise. At the same time he is making urine. Patient underwent placement of HD catheter. He was dialyzed for the first time on 2/9. Nephrology is following. Patient was given high-dose steroids. Now on oral prednisone.   Sepsis of unclear source Culture data negative till date. Repeat chest x-ray is negative. Patient was  initially started on the sepsis  pathway. He was given vancomycin and Zosyn. Vancomycin was discontinued due to renal failure. No clear infectious foci. MRI brain did not show any acute abnormalities. MRI abdomen did not show any intra-abdominal process. WBC is normal. Zosyn was discontinued as well. Patient could've had a viral syndrome. MRI brain did show paranasal sinus disease and mastoid fluid. However patient has recently completed multiple courses of antibiotics for same. Continue to monitor off of antibiotics. Sepsis physiology, resolved. WBC is normal.  Metabolic encephalopathy MRI brain did not show any acute process. Confusion could have been due to acute renal failure. Now appears to be close to baseline.  Elevated alkaline phosphatase Patient noted an elevated alkaline phosphatase. AST, ALT normal. Bilirubin normal. Abdominal ultrasound showed possible pancreatic mass. MRI did not show any lesion in the pancreas. Finding on the ultrasound was thought to be artifactual from bowel. Outpatient follow-up for elevated alkaline phosphatase.  Hypothyroidism Continue Synthroid.  Hyponatremia Sodium level remains low but stable. Has improved after dialysis.  Hyperglycemia: No history of diabetes as outpatient. Hyperglycemia is likely due to steroids. Since he is now just on oral steroids, we will cut back further on insulin to avoid hypoglycemic episodes. HbA1c was 6.5.   DVT Prophylaxis: Subcutaneous heparin  Code Status: Full code  Family Communication: Discussed with patient Disposition Plan: Management as discussed above. Mobilize.    LOS: 7 days   [facility] Pager [phone] 04/23/2016, 9:26 AM  If 7PM-7AM, please contact night-coverage at [email or web address], password [facility]

## 2016-04-23 NOTE — Progress Notes (Signed)
PT Cancellation Note  Patient Details Name: DEUNTE CRITCHER MRN: [id number] DOB: [birth year]   Cancelled Treatment:    Reason Eval/Treat Not Completed: Patient at procedure or test/unavailable. Pt in HD. PT to re-attempt eval as time allows.   Lorriane Shire 04/23/2016, 10:22 AM

## 2016-04-23 NOTE — Progress Notes (Signed)
MEDICATION RELATED CONSULT NOTE - INITIAL   Pharmacy Consult for Cytoxan  Indication: ANCA Renal Vasculitis   Assessment: 63 yoM diagnosed with ANCA renal vasculitis. Pharmacy consulted for advice on Cytoxan dosing, specifically age and renal adjustments, and infection prophylaxis.   Possible Recommendations:  -Cytoxan dose for HD patients: 0.8 mg/kg/day given post-HD session  -Steroids: 1) methylprednsiolone pulse of 500 mg/day x 3 days 2) prednisone 1 mg/kg/day x 1 month with slow taper (max 60-80 mg/day) -PCP Prophylaxis may be considered: Bactrim SS 1 tab daily or DS 1 tab 3 times/week, can be discontinued when prednisone is 10mg  or less per day.   Resources Consulted: 1. Merkel et al. 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 UpToDate. Jan 2017. 2. Maritati et al. Methotrexate versus cyclophosphamide for remission maintenance in ANCA-associated vasculitis: A randomised trial. PLoS One 2017.   Thank you for inviting [place] to be involved in this patient's care!   Belia Heman, PharmD PGY1 Pharmacy Resident MS3 [phone] 04/23/2016 12:25 PM

## 2016-04-23 NOTE — Progress Notes (Addendum)
[facility] Progress Note   Subjective: doing well, no problems with HD yesterday  Vitals:   04/23/16 1230 04/23/16 1300 04/23/16 1310 04/23/16 1435  BP: 127/64 118/67 (!) 150/60 130/80  Pulse: 69 69 67 85  Resp: 17 18 17 16   Temp:   97.4 F (36.3 C) 97.5 F (36.4 C)  TempSrc:   Oral Oral  SpO2:   100% 96%  Weight:   87.2 kg (192 lb 3.9 oz)   Height:        Inpatient medications: . aspirin EC  81 mg Oral Daily  . atorvastatin  40 mg Oral q1800  . cyclophosphamide  100 mg Oral Daily  . finasteride  5 mg Oral Daily  . gabapentin  100 mg Oral QHS  . insulin aspart  0-15 Units Subcutaneous TID WC  . insulin aspart  0-5 Units Subcutaneous QHS  . insulin aspart  4 Units Subcutaneous TID WC  . [START ON 04/24/2016] insulin detemir  5 Units Subcutaneous Daily  . levothyroxine  200 mcg Oral QAC breakfast  . predniSONE  60 mg Oral Q breakfast  . senna  1 tablet Oral BID  . [START ON 04/25/2016] sulfamethoxazole-trimethoprim  1 tablet Oral Q M,W,F-1800    acetaminophen **OR** acetaminophen, fentaNYL, midazolam, ondansetron **OR** ondansetron (ZOFRAN) IV, traMADol, zolpidem  Exam: Gen alert, pleasant, no distress, no asterixis No jvd Chest clear bilat to bases RRR distant HS, no mrg Abd soft ntnd no mass or ascites +bs obese and tympanitic Ext 1-2+ LE edema bilat Neuro is alert, Ox 3 , nf, no asterixis  Urine (by undersigned) > clear , yellow, 2+ blood and protein, 0-5 rbc/ wbc's per HPF.  Numerous granular casts, numerous degenerating cellular casts, +rbc casts and mixed rbc/ wbc casts.   Abd [place] > 13 cm kidneys, no hydro  Assessment: 1.  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 ANCA is + (MPO).  +sinus issues x 2 mos. Plan 2nd HD today.  UOP down with UF on HD.  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 Final results pending.  Will start pt on po Cytoxan 0.8  mg/kg (adjuted for GFR/ age) and Bactrim prophylaxis. Appreciate pharm assistance. Have d/w son potential side effects.   2.  Volume - wt's down some w HD, now only 4kg up from admit 4.  SP renal biopsy 2/7 - hold all anticoagulants for now including DVT proph   Plan - as above   Kelly Splinter MD [facility] pager [phone]   04/23/2016, 2:45 PM    Recent Labs Lab 04/19/16 0512  04/21/16 0535 04/22/16 0538 04/23/16 0519  NA 127*  < > 127* 128* 134*  K 4.0  < > 3.9 4.0 3.6  CL 97*  < > 93* 95* 96*  CO2 14*  < > 14* 13* 21*  GLUCOSE 272*  < > 152* 143* 75  BUN 63*  < > 94* 113* 78*  CREATININE 6.29*  < > 7.66* 8.57* 6.44*  CALCIUM 7.0*  < > 7.1* 7.1* 7.0*  PHOS 8.9*  --   --  12.3* 8.4*  < > = values in this interval not displayed.  Recent Labs Lab 04/16/16 1535 04/17/16 0509 04/17/16 0823 04/19/16 0512 04/22/16 0538 04/23/16 0519  AST 41 19 16  --   --   --   ALT 30 28 24   --   --   --  ALKPHOS 378* 336* 304*  --   --   --   BILITOT 1.2 0.8 0.8  --   --   --   PROT 6.6 6.6 5.7*  --   --   --   ALBUMIN 2.7* 2.4* 2.2* 2.1* 1.9* 1.7*    Recent Labs Lab 04/16/16 1535  04/17/16 0823 04/21/16 0535 04/23/16 0519  WBC 8.4  < > 9.4 5.8 7.5  NEUTROABS 6.6  --  7.5  --   --   HGB 13.6  < > 10.2* 9.2* 10.1*  HCT 40.1  < > 30.0* 26.7* 29.2*  MCV 85.1  < > 85.7 82.9 83.4  PLT 194  < > 185 299 308  < > = values in this interval not displayed. Iron/TIBC/Ferritin/ %Sat No results found for: IRON, TIBC, FERRITIN, IRONPCTSAT

## 2016-04-24 DIAGNOSIS — I776 Arteritis, unspecified: Secondary | ICD-10-CM

## 2016-04-24 DIAGNOSIS — I7782 Antineutrophilic cytoplasmic antibody (ANCA) vasculitis: Secondary | ICD-10-CM

## 2016-04-24 DIAGNOSIS — N009 Acute nephritic syndrome with unspecified morphologic changes: Secondary | ICD-10-CM | POA: Diagnosis present

## 2016-04-24 LAB — GLUCOSE, CAPILLARY
GLUCOSE-CAPILLARY: 184 mg/dL — AB (ref 65–99)
GLUCOSE-CAPILLARY: 177 mg/dL — AB (ref 65–99)
Glucose-Capillary: 155 mg/dL — ABNORMAL HIGH (ref 65–99)
Glucose-Capillary: 163 mg/dL — ABNORMAL HIGH (ref 65–99)

## 2016-04-24 LAB — RENAL FUNCTION PANEL
ALBUMIN: 1.8 g/dL — AB (ref 3.5–5.0)
ANION GAP: 13 (ref 5–15)
BUN: 47 mg/dL — ABNORMAL HIGH (ref 6–20)
CALCIUM: 7.2 mg/dL — AB (ref 8.9–10.3)
CO2: 24 mmol/L (ref 22–32)
CREATININE: 4.69 mg/dL — AB (ref 0.61–1.24)
Chloride: 96 mmol/L — ABNORMAL LOW (ref 101–111)
GFR, EST AFRICAN AMERICAN: 13 mL/min — AB (ref >60)
GFR, EST NON AFRICAN AMERICAN: 11 mL/min — AB (ref >60)
Glucose, Bld: 174 mg/dL — ABNORMAL HIGH (ref 65–99)
PHOSPHORUS: 6.1 mg/dL — AB (ref 2.5–4.6)
Potassium: 4.3 mmol/L (ref 3.5–5.1)
Sodium: 133 mmol/L — ABNORMAL LOW (ref 135–145)

## 2016-04-24 NOTE — Progress Notes (Addendum)
  [facility] Progress Note   Subjective: doing well, no problems today. Had #2 HD yest  Vitals:   04/23/16 2132 04/24/16 0500 04/24/16 0602 04/24/16 0916  BP: 140/73  (!) 121/55 140/60  Pulse: 71  61 85  Resp: 17  16 18   Temp: 97.9 F (36.6 C)  98.4 F (36.9 C) 98.3 F (36.8 C)  TempSrc: Oral  Oral Oral  SpO2: 94%  98% 95%  Weight: 87.6 kg (193 lb 3.2 oz) 87.6 kg (193 lb 2 oz)    Height:        Inpatient medications: . aspirin EC  81 mg Oral Daily  . atorvastatin  40 mg Oral q1800  . cyclophosphamide  75 mg Oral Daily  . finasteride  5 mg Oral Daily  . gabapentin  100 mg Oral QHS  . insulin aspart  0-15 Units Subcutaneous TID WC  . insulin aspart  0-5 Units Subcutaneous QHS  . insulin aspart  4 Units Subcutaneous TID WC  . insulin detemir  5 Units Subcutaneous Daily  . levothyroxine  200 mcg Oral QAC breakfast  . predniSONE  60 mg Oral Q breakfast  . senna  1 tablet Oral BID  . [START ON 04/25/2016] sulfamethoxazole-trimethoprim  1 tablet Oral Q M,W,F-1800    acetaminophen **OR** acetaminophen, fentaNYL, midazolam, ondansetron **OR** ondansetron (ZOFRAN) IV, traMADol, zolpidem  Exam: Gen alert, pleasant, no distress, no asterixis No jvd Chest clear bilat to bases RRR distant HS, no mrg Abd soft ntnd no mass or ascites +bs obese and tympanitic Ext 1-2+ LE edema bilat Neuro is alert, Ox 3 , nf, no asterixis  Urine (by undersigned) > clear , yellow, 2+ blood and protein, 0-5 rbc/ wbc's per HPF.  Numerous granular casts, numerous degenerating cellular casts, +rbc casts and mixed rbc/ wbc casts.   Abd [place] > 13 cm kidneys, no hydro  Assessment: 1.  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 ANCA is + (MPO).  +sinus issues x 2 mos.  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 Final results pending. Has good UOP.  Have started po Cytoxan 0.8 mg/kg (adjusted  for GFR/ age) and Bactrim prophylaxis, cont pred 60/d. Appreciate pharm assistance. Have d/w son potential side effects.  HD #3 tomorrow, no heparin w HD post-biopsy.  Have d/w all family members hope for recovery of renal function in weeks/mos to come but also possibly of ESRD.  Bx results may help prognosticate.  2.  Vol excess - tolerating moderate UF with HD 3.  SP renal biopsy 2/7 - avoid anticoagulants for now   Plan - as above. HD Monday.   Kelly Splinter MD [facility] pager [phone]   04/24/2016, 1:14 PM    Recent Labs Lab 04/22/16 0538 04/23/16 0519 04/24/16 0217  NA 128* 134* 133*  K 4.0 3.6 4.3  CL 95* 96* 96*  CO2 13* 21* 24  GLUCOSE 143* 75 174*  BUN 113* 78* 47*  CREATININE 8.57* 6.44* 4.69*  CALCIUM 7.1* 7.0* 7.2*  PHOS 12.3* 8.4* 6.1*    Recent Labs Lab 04/22/16 0538 04/23/16 0519 04/24/16 0217  ALBUMIN 1.9* 1.7* 1.8*    Recent Labs Lab 04/21/16 0535 04/23/16 0519  WBC 5.8 7.5  HGB 9.2* 10.1*  HCT 26.7* 29.2*  MCV 82.9 83.4  PLT 299 308   Iron/TIBC/Ferritin/ %Sat No results found for: IRON, TIBC, FERRITIN, IRONPCTSAT

## 2016-04-24 NOTE — Evaluation (Signed)
Physical Therapy Evaluation Patient Details Name: Nicholas Hudson MRN: [id number] DOB: 02-Jan-1941 Today's Date: 04/24/2016   History of Present Illness  76 y.o. male with medical history significant of PBH, gout, recurrent sinus infection, hypothyroidism. Patient with history of sinus, polyps  problems, infection for last 6 to 8 weeks, he has received 2 course of antibiotics by Dr Lucia Gaskins. Since 6 to 8 weeks ago he has been having headaches, less active, more sleepy/. Since 4 days ago he was  notice to be confuse, and lethargic by family .Mild leukocytosis worsening renal function and mild fever. Pt admitted for acute kidney injury. HD initiated 04-22-16.  Clinical Impression  Pt admitted with above diagnosis. Pt currently with functional limitations due to the deficits listed below (see PT Problem List). On eval, pt required min guard assist with transfers and ambulation 200 feet with RW.  Pt will benefit from skilled PT to increase their independence and safety with mobility to allow discharge to the venue listed below.  PT to follow acutely. No f/u service indicated at d/c.     Follow Up Recommendations No PT follow up;Supervision for mobility/OOB    Equipment Recommendations  None recommended by PT    Recommendations for Other Services       Precautions / Restrictions Precautions Precautions: Fall      Mobility  Bed Mobility Overal bed mobility: Modified Independent                Transfers Overall transfer level: Needs assistance Equipment used: Rolling walker (2 wheeled) Transfers: Sit to/from [facility] Sit to Stand: Min guard Stand pivot transfers: Min guard       General transfer comment: min guard for safety only  Ambulation/Gait Ambulation/Gait assistance: Min guard Ambulation Distance (Feet): 200 Feet Assistive device: Rolling walker (2 wheeled) Gait Pattern/deviations: Step-through pattern;Decreased stride length Gait velocity:  decreased Gait velocity interpretation: Below normal speed for age/gender General Gait Details: verbal cues to look up  Stairs            Wheelchair Mobility    Modified Rankin (Stroke Patients Only)       Balance Overall balance assessment: Needs assistance Sitting-balance support: No upper extremity supported;Feet supported Sitting balance-Leahy Scale: Good     Standing balance support: Bilateral upper extremity supported Standing balance-Leahy Scale: Fair Standing balance comment: RW needed for ambulation                             Pertinent Vitals/Pain Pain Assessment: No/denies pain    Home Living Family/patient expects to be discharged to:: Private residence Living Arrangements: Spouse/significant other Available Help at Discharge: Family Type of Home: House Home Access: Stairs to enter   Technical brewer of Steps: 1 Home Layout: One level Home Equipment: Environmental consultant - 4 wheels;Cane - single point Additional Comments: Son is planning to install grab bars in bathroom.    Prior Function Level of Independence: Independent               Hand Dominance        Extremity/Trunk Assessment   Upper Extremity Assessment Upper Extremity Assessment: Defer to OT evaluation    Lower Extremity Assessment Lower Extremity Assessment: Generalized weakness    Cervical / Trunk Assessment Cervical / Trunk Assessment: Normal  Communication   Communication: No difficulties  Cognition Arousal/Alertness: Awake/alert Behavior During Therapy: WFL for tasks assessed/performed Overall Cognitive Status: Within Functional Limits for tasks assessed  General Comments      Exercises     Assessment/Plan    PT Assessment Patient needs continued PT services  PT Problem List Decreased strength;Decreased activity tolerance;Decreased balance;Decreased mobility;Decreased knowledge of use of DME          PT Treatment  Interventions DME instruction;Gait training;Stair training;Functional mobility training;Balance training;Therapeutic exercise;Patient/family education;Therapeutic activities    PT Goals (Current goals can be found in the Care Plan section)  Acute Rehab PT Goals Patient Stated Goal: home PT Goal Formulation: With patient Time For Goal Achievement: 05/08/16 Potential to Achieve Goals: Good    Frequency Min 3X/week   Barriers to discharge        Co-evaluation PT/OT/SLP Co-Evaluation/Treatment: Yes Reason for Co-Treatment: To address functional/ADL transfers;Complexity of the patient's impairments (multi-system involvement) PT goals addressed during session: Mobility/safety with mobility;Balance         End of Session Equipment Utilized During Treatment: Gait belt Activity Tolerance: Patient tolerated treatment well Patient left: in bed;with call bell/phone within reach Nurse Communication: Mobility status         Time: [id number] PT Time Calculation (min) (ACUTE ONLY): 20 min   Charges:   PT Evaluation $PT Eval Low Complexity: 1 Procedure     PT G Codes:        Lorriane Shire 04/24/2016, 10:46 AM

## 2016-04-24 NOTE — Evaluation (Signed)
Occupational Therapy Evaluation Patient Details Name: Nicholas Hudson MRN: [id number] DOB: 1940-04-16 Today's Date: 04/24/2016    History of Present Illness 76 y.o. male with medical history significant of PBH, gout, recurrent sinus infection, hypothyroidism. Patient with history of sinus, polyps  problems, infection for last 6 to 8 weeks, he has received 2 course of antibiotics by Dr Lucia Gaskins. Since 6 to 8 weeks ago he has been having headaches, less active, more sleepy/. Since 4 days ago he was  notice to be confuse, and lethargic by family .Mild leukocytosis worsening renal function and mild fever. Pt admitted for acute kidney injury. HD initiated 04-22-16.   Clinical Impression   Pt admitted with the above diagnoses and presents with below problem list. Pt will benefit from continued acute OT to address the below listed deficits and maximize independence with basic ADLs prior to d/c home with family assisting as needed. PTA pt independent with ADLs. Pt currently min guard for LB ADLs and functional mobility/transfers. Utilized rw during session. Pt completed household distance functional mobility at min guard level.      Follow Up Recommendations  No OT follow up;Supervision - Intermittent;Other (comment) (mobility/OOB)    Equipment Recommendations  3 in 1 bedside commode    Recommendations for Other Services       Precautions / Restrictions Precautions Precautions: Fall      Mobility Bed Mobility Overal bed mobility: Modified Independent                Transfers Overall transfer level: Needs assistance Equipment used: Rolling walker (2 wheeled) Transfers: Sit to/from [facility] Sit to Stand: Min guard Stand pivot transfers: Min guard       General transfer comment: min guard for safety only    Balance Overall balance assessment: Needs assistance Sitting-balance support: No upper extremity supported;Feet supported Sitting balance-Leahy Scale:  Good     Standing balance support: Bilateral upper extremity supported Standing balance-Leahy Scale: Fair Standing balance comment: RW needed for ambulation                            ADL Overall ADL's : Needs assistance/impaired Eating/Feeding: Set up;Sitting   Grooming: Min guard;Set up;Sitting;Standing   Upper Body Bathing: Set up;Sitting   Lower Body Bathing: Min guard;Sit to/from stand   Upper Body Dressing : Set up;Sitting   Lower Body Dressing: Min guard;Sit to/from stand   Toilet Transfer: Min guard;Ambulation;RW   Toileting- Clothing Manipulation and Hygiene: Min guard   Tub/ Shower Transfer: Tub transfer;Min guard;Ambulation;3 in 1;Rolling walker Tub/Shower Transfer Details (indicate cue type and reason): Simulated transfer in room. External support needed (wall, grab bar) Functional mobility during ADLs: Min guard;Rolling walker General ADL Comments: Pt completed household distance functional mobility, toilet transfer, and simulated tub transfer as detailed above. Educated on energy conservation and provided Fish farm manager      Pertinent Vitals/Pain Pain Assessment: No/denies pain     Hand Dominance     Extremity/Trunk Assessment Upper Extremity Assessment Upper Extremity Assessment: Generalized weakness   Lower Extremity Assessment Lower Extremity Assessment: Defer to PT evaluation   Cervical / Trunk Assessment Cervical / Trunk Assessment: Normal   Communication Communication Communication: No difficulties   Cognition Arousal/Alertness: Awake/alert Behavior During Therapy: WFL for tasks assessed/performed Overall Cognitive Status: Within Functional Limits for tasks assessed  General Comments       Exercises       Shoulder Instructions      Home Living Family/patient expects to be discharged to:: Private residence Living Arrangements: Spouse/significant  other Available Help at Discharge: Family Type of Home: House Home Access: Stairs to enter Technical brewer of Steps: 1   Home Layout: One level     Bathroom Shower/Tub: Tub/shower unit Shower/tub characteristics: Architectural technologist: Standard Bathroom Accessibility: Yes How Accessible: Accessible via walker Home Equipment: [place] - 4 wheels;Cane - single point   Additional Comments: Son is planning to install grab bars in bathroom.      Prior Functioning/Environment Level of Independence: Independent                 OT Problem List: Impaired balance (sitting and/or standing);Decreased knowledge of use of DME or AE;Decreased knowledge of precautions   OT Treatment/Interventions: Self-care/ADL training;Energy conservation;DME and/or AE instruction;Therapeutic activities;Patient/family education;Balance training    OT Goals(Current goals can be found in the care plan section) Acute Rehab OT Goals Patient Stated Goal: home OT Goal Formulation: With patient Time For Goal Achievement: 05/01/16 Potential to Achieve Goals: Good ADL Goals Pt Will Perform Grooming: standing;with supervision Pt Will Perform Upper Body Dressing: with modified independence;sitting Pt Will Perform Lower Body Dressing: with modified independence;sit to/from stand Pt Will Perform Tub/Shower Transfer: Tub transfer;with supervision;ambulating;3 in 1;rolling walker Additional ADL Goal #1: Pt will independently verbalize 2 energy conservation strategies to incorporate into ADLs.   OT Frequency: Min 2X/week   Barriers to D/C:            Co-evaluation PT/OT/SLP Co-Evaluation/Treatment: Yes Reason for Co-Treatment: To address functional/ADL transfers;Complexity of the patient's impairments (multi-system involvement) PT goals addressed during session: Mobility/safety with mobility;Balance OT goals addressed during session: ADL's and self-care      End of Session Equipment Utilized During  Treatment: Gait belt;Rolling walker  Activity Tolerance: Patient tolerated treatment well Patient left: in bed;with call bell/phone within reach;Other (comment) (sitting EOB eating breakfast)   Time: [id number] OT Time Calculation (min): 20 min Charges:  OT General Charges $OT Visit: 1 Procedure OT Evaluation $OT Eval Low Complexity: 1 Procedure G-Codes:    Hortencia Pilar 2016-04-29, 11:28 AM

## 2016-04-24 NOTE — Progress Notes (Signed)
[facility] PROGRESS NOTE  Nicholas Hudson ZMC:[id number] DOB: 1940-05-17 DOA: 04/16/2016  PCP: Kandice Hams, MD  Brief History/Interval Summary: Nicholas Hudson is an 76 y.o. male with medical history significant of PBH, gout, recurrent sinus infection, hypothyroidism. Patient with history of sinus, polyps problems, infection for last 6 to 8 weeks, he has received 2 course of antibiotics by Dr Lucia Gaskins. Patient was found to have worsening renal function. He was hospitalized for further management.   Reason for Visit: Acute renal failure  Consultants: Nephrology  Procedures:  Renal biopsy 2/7  Placement of HD catheter 2/9  Echocardiogram Study Conclusions  - Left ventricle: The cavity size was normal. There was moderate   concentric hypertrophy. Systolic function was normal. The   estimated ejection fraction was in the range of 50% to 55%. Wall   motion was normal; there were no regional wall motion   abnormalities. Doppler parameters are consistent with abnormal   left ventricular relaxation (grade 1 diastolic dysfunction). - Aortic valve: Trileaflet; mildly thickened, mildly calcified   leaflets. - Aorta: Ascending aortic diameter: 42 mm (S). - Ascending aorta: The ascending aorta was mildly dilated. - Mitral valve: There was mild regurgitation. - Left atrium: The atrium was mildly dilated.  Antibiotics: Initially started on vancomycin and Zosyn. Both of these antibiotics have been discontinued.  Subjective/Interval History: Patient denies any complaints today. Overall feels better. He is concerned about his kidney function. Denies any shortness of breath.    ROS: Denies any nausea or vomiting  Objective:  Vital Signs  Vitals:   04/23/16 1710 04/23/16 2132 04/24/16 0500 04/24/16 0602  BP: (!) 141/69 140/73  (!) 121/55  Pulse: 71 71  61  Resp: '18 17  16  ' Temp: 97.4 F (36.3 C) 97.9 F (36.6 C)  98.4 F (36.9 C)  TempSrc: Oral Oral  Oral  SpO2: 98% 94%   98%  Weight:  87.6 kg (193 lb 3.2 oz) 87.6 kg (193 lb 2 oz)   Height:        Intake/Output Summary (Last 24 hours) at 04/24/16 2449 Last data filed at 04/24/16 0602  Gross per 24 hour  Intake              480 ml  Output             2550 ml  Net            -2070 ml   Filed Weights   04/23/16 1310 04/23/16 2132 04/24/16 0500  Weight: 87.2 kg (192 lb 3.9 oz) 87.6 kg (193 lb 3.2 oz) 87.6 kg (193 lb 2 oz)    General appearance: alert, cooperative, appears stated age and no distress Resp: clear to auscultation bilaterally Cardio: regular rate and rhythm, S1, S2 normal, no murmur, click, rub or gallop GI: soft, non-tender; bowel sounds normal; no masses,  no organomegaly Extremities: Does have some edema bilateral lower extremity Neurologic: Awake and alert. No focal deficits.  Lab Results:  Data Reviewed: I have personally reviewed following labs and imaging studies  CBC:  Recent Labs Lab 04/21/16 0535 04/23/16 0519  WBC 5.8 7.5  HGB 9.2* 10.1*  HCT 26.7* 29.2*  MCV 82.9 83.4  PLT 299 753    Basic Metabolic Panel:  Recent Labs Lab 04/19/16 0512 04/20/16 0953 04/21/16 0535 04/22/16 0538 04/23/16 0519 04/24/16 0217  NA 127* 128* 127* 128* 134* 133*  K 4.0 4.0 3.9 4.0 3.6 4.3  CL 97* 96* 93* 95* 96* 96*  CO2 14* 14* 14* 13* 21* 24  GLUCOSE 272* 184* 152* 143* 75 174*  BUN 63* 78* 94* 113* 78* 47*  CREATININE 6.29* 7.27* 7.66* 8.57* 6.44* 4.69*  CALCIUM 7.0* 7.3* 7.1* 7.1* 7.0* 7.2*  PHOS 8.9*  --   --  12.3* 8.4* 6.1*    GFR: Estimated Creatinine Clearance: 13.4 mL/min (by C-G formula based on SCr of 4.69 mg/dL (H)).  Liver Function Tests:  Recent Labs Lab 04/19/16 0512 04/22/16 0538 04/23/16 0519 04/24/16 0217  ALBUMIN 2.1* 1.9* 1.7* 1.8*    CBG:  Recent Labs Lab 04/23/16 0801 04/23/16 1350 04/23/16 1642 04/23/16 2131 04/24/16 0753  GLUCAP 86 117* 212* 178* 155*     Recent Results (from the past 240 hour(s))  Culture, blood (Routine x  2)     Status: None   Collection Time: 04/16/16  3:32 PM  Result Value Ref Range Status   Specimen Description BLOOD RIGHT WRIST  Final   Special Requests BOTTLES DRAWN AEROBIC AND ANAEROBIC 5 CC EACH  Final   Culture   Final    NO GROWTH 5 DAYS Performed at [facility], [place] [address], [place], [place] [address]    Report Status 04/21/2016 FINAL  Final  Culture, blood (Routine x 2)     Status: None   Collection Time: 04/16/16  4:15 PM  Result Value Ref Range Status   Specimen Description BLOOD RIGHT HAND  Final   Special Requests BOTTLES DRAWN AEROBIC ONLY 5 CC  Final   Culture   Final    NO GROWTH 5 DAYS Performed at [facility], [place] [address], [place], [place] [address]    Report Status 04/21/2016 FINAL  Final  Urine culture     Status: None   Collection Time: 04/16/16  4:23 PM  Result Value Ref Range Status   Specimen Description URINE, RANDOM  Final   Special Requests NONE  Final   Culture   Final    NO GROWTH Performed at [facility], [place] [address], [place], [place] [address]    Report Status 04/18/2016 FINAL  Final  Culture, blood (x 2)     Status: None   Collection Time: 04/17/16  8:19 AM  Result Value Ref Range Status   Specimen Description BLOOD LEFT ANTECUBITAL  Final   Special Requests BOTTLES DRAWN AEROBIC AND ANAEROBIC 5CC  Final   Culture   Final    NO GROWTH 5 DAYS Performed at [facility], [place] [address], [place], [place] [address]    Report Status 04/22/2016 FINAL  Final  Culture, blood (x 2)     Status: None   Collection Time: 04/17/16  8:27 AM  Result Value Ref Range Status   Specimen Description BLOOD RIGHT HAND  Final   Special Requests BOTTLES DRAWN AEROBIC AND ANAEROBIC 5CC  Final   Culture   Final    NO GROWTH 5 DAYS Performed at [facility], [place] [address], [place], [place] [address]    Report Status 04/22/2016 FINAL  Final      Radiology Studies: Ir Fluoro Guide Cv Line Right  Result Date:  04/22/2016 INDICATION: End-stage renal disease. In in need of intravenous access for the initiation of dialysis. EXAM: TUNNELED CENTRAL VENOUS HEMODIALYSIS CATHETER PLACEMENT WITH ULTRASOUND AND FLUOROSCOPIC GUIDANCE MEDICATIONS: Ancef 2 gm IV . The antibiotic was given in an appropriate time interval prior to skin puncture. ANESTHESIA/SEDATION: Versed 1 mg IV; Fentanyl 50 mcg  IV; Moderate Sedation Time:  18 The patient was continuously monitored during the procedure by the interventional radiology nurse under my direct supervision. FLUOROSCOPY TIME:  18 seconds (3 mGy). COMPLICATIONS: None immediate. PROCEDURE: Informed written consent was obtained from the patient after a discussion of the risks, benefits, and alternatives to treatment. Questions regarding the procedure were encouraged and answered. The right neck and chest were prepped with chlorhexidine in a sterile fashion, and a sterile drape was applied covering the operative field. Maximum barrier sterile technique with sterile gowns and gloves were used for the procedure. A timeout was performed prior to the initiation of the procedure. After creating a small venotomy incision, a micropuncture kit was utilized to access the right internal jugular vein under direct, real-time ultrasound guidance after the overlying soft tissues were anesthetized with 1% lidocaine with epinephrine. Ultrasound image documentation was performed. The microwire was kinked to measure appropriate catheter length. A stiff Glidewire was advanced to the level of the IVC and the micropuncture sheath was exchanged for a peel-away sheath. A palindrome tunneled hemodialysis catheter measuring 19 cm from tip to cuff was tunneled in a retrograde fashion from the anterior chest wall to the venotomy incision. The catheter was then placed through the peel-away sheath with tips ultimately positioned within the superior aspect of the right atrium. Final catheter positioning was confirmed and  documented with a spot radiographic image. The catheter aspirates and flushes normally. The catheter was flushed with appropriate volume heparin dwells. The catheter exit site was secured with a 0-Prolene retention suture. The venotomy incision was closed with an interrupted 4-0 Vicryl, Dermabond and Steri-strips. Dressings were applied. The patient tolerated the procedure well without immediate post procedural complication. IMPRESSION: Successful placement of 19 cm tip to cuff tunneled hemodialysis catheter via the right internal jugular vein with tips terminating within the superior aspect of the right atrium. The catheter is ready for immediate use. Electronically Signed   By: Sandi Mariscal M.D.   On: 04/22/2016 11:05   Ir US Guide Vasc Access Right  Result Date: 04/22/2016 INDICATION: End-stage renal disease. In in need of intravenous access for the initiation of dialysis. EXAM: TUNNELED CENTRAL VENOUS HEMODIALYSIS CATHETER PLACEMENT WITH ULTRASOUND AND FLUOROSCOPIC GUIDANCE MEDICATIONS: Ancef 2 gm IV . The antibiotic was given in an appropriate time interval prior to skin puncture. ANESTHESIA/SEDATION: Versed 1 mg IV; Fentanyl 50 mcg IV; Moderate Sedation Time:  18 The patient was continuously monitored during the procedure by the interventional radiology nurse under my direct supervision. FLUOROSCOPY TIME:  18 seconds (3 mGy). COMPLICATIONS: None immediate. PROCEDURE: Informed written consent was obtained from the patient after a discussion of the risks, benefits, and alternatives to treatment. Questions regarding the procedure were encouraged and answered. The right neck and chest were prepped with chlorhexidine in a sterile fashion, and a sterile drape was applied covering the operative field. Maximum barrier sterile technique with sterile gowns and gloves were used for the procedure. A timeout was performed prior to the initiation of the procedure. After creating a small venotomy incision, a micropuncture  kit was utilized to access the right internal jugular vein under direct, real-time ultrasound guidance after the overlying soft tissues were anesthetized with 1% lidocaine with epinephrine. Ultrasound image documentation was performed. The microwire was kinked to measure appropriate catheter length. A stiff Glidewire was advanced to the level of the IVC and the micropuncture sheath was exchanged for a peel-away sheath. A palindrome tunneled hemodialysis catheter measuring 19 cm from tip to  cuff was tunneled in a retrograde fashion from the anterior chest wall to the venotomy incision. The catheter was then placed through the peel-away sheath with tips ultimately positioned within the superior aspect of the right atrium. Final catheter positioning was confirmed and documented with a spot radiographic image. The catheter aspirates and flushes normally. The catheter was flushed with appropriate volume heparin dwells. The catheter exit site was secured with a 0-Prolene retention suture. The venotomy incision was closed with an interrupted 4-0 Vicryl, Dermabond and Steri-strips. Dressings were applied. The patient tolerated the procedure well without immediate post procedural complication. IMPRESSION: Successful placement of 19 cm tip to cuff tunneled hemodialysis catheter via the right internal jugular vein with tips terminating within the superior aspect of the right atrium. The catheter is ready for immediate use. Electronically Signed   By: Sandi Mariscal M.D.   On: 04/22/2016 11:05     Medications:  Scheduled: . aspirin EC  81 mg Oral Daily  . atorvastatin  40 mg Oral q1800  . cyclophosphamide  75 mg Oral Daily  . finasteride  5 mg Oral Daily  . gabapentin  100 mg Oral QHS  . insulin aspart  0-15 Units Subcutaneous TID WC  . insulin aspart  0-5 Units Subcutaneous QHS  . insulin aspart  4 Units Subcutaneous TID WC  . insulin detemir  5 Units Subcutaneous Daily  . levothyroxine  200 mcg Oral QAC breakfast   . predniSONE  60 mg Oral Q breakfast  . senna  1 tablet Oral BID  . 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 His creatinine was 1.0 about a year ago. Nephrology was consulted. Autoimmune workup is positive for an elevated myeloperoxidase antibody. 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 Patient transferred over to this facility for further management. Renal biopsy was performed 2/7. 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 Final report is pending. Patient started on cyclophosphamide and Bactrim for prophylaxis. Patient was dialyzed again on 2/10. Nephrology is following and managing. He was also given high-dose steroids. Now on oral prednisone.   Sepsis of unclear source Culture data negative till date. Repeat chest x-ray is negative. Patient was initially started on the sepsis pathway. He was given vancomycin and Zosyn. Vancomycin was discontinued due to renal failure. No clear infectious foci. MRI brain did not show any acute abnormalities. MRI abdomen did not show any intra-abdominal process. WBC is normal. Zosyn was discontinued as well. Patient could've had a viral syndrome. MRI brain did show paranasal sinus disease and mastoid fluid. However patient has recently completed multiple courses of antibiotics for same. Continue to monitor off of antibiotics. Sepsis physiology, resolved. WBC is  normal.  Metabolic encephalopathy Resolved. MRI brain did not show any acute process. Confusion could have been due to acute renal failure.   Elevated alkaline phosphatase Patient noted an elevated alkaline phosphatase. AST, ALT normal. Bilirubin normal. Abdominal ultrasound showed possible pancreatic mass. MRI did not show any lesion in the pancreas. Finding on the ultrasound was thought to be artifactual from bowel.  Outpatient follow-up for elevated alkaline phosphatase.  Hypothyroidism Continue Synthroid.  Hyponatremia Has improved after dialysis.  Hyperglycemia No history of diabetes as outpatient. Hyperglycemia is likely due to steroids. Since he is now just on oral steroids, we decreased the dose of his insulin. HbA1c was 6.5. Continue to monitor CBGs and adjust dose of insulin as needed.   DVT Prophylaxis: SCDs  Code Status: Full code  Family Communication: Discussed with patient Disposition Plan: Management as discussed above. Mobilize.    LOS: 8 days   [facility] Pager [phone] 04/24/2016, 8:26 AM  If 7PM-7AM, please contact night-coverage at [email or web address], password [facility]

## 2016-04-25 LAB — ABO/RH: ABO/RH(D): A POS

## 2016-04-25 LAB — RENAL FUNCTION PANEL
ALBUMIN: 2 g/dL — AB (ref 3.5–5.0)
Anion gap: 12 (ref 5–15)
BUN: 62 mg/dL — AB (ref 6–20)
CHLORIDE: 97 mmol/L — AB (ref 101–111)
CO2: 22 mmol/L (ref 22–32)
CREATININE: 5.47 mg/dL — AB (ref 0.61–1.24)
Calcium: 7.3 mg/dL — ABNORMAL LOW (ref 8.9–10.3)
GFR calc Af Amer: 10 mL/min — ABNORMAL LOW (ref >60)
GFR calc non Af Amer: 9 mL/min — ABNORMAL LOW (ref >60)
GLUCOSE: 111 mg/dL — AB (ref 65–99)
PHOSPHORUS: 7.1 mg/dL — AB (ref 2.5–4.6)
POTASSIUM: 3.8 mmol/L (ref 3.5–5.1)
Sodium: 131 mmol/L — ABNORMAL LOW (ref 135–145)

## 2016-04-25 LAB — POCT I-STAT, CHEM 8
BUN: 30 mg/dL — ABNORMAL HIGH (ref 6–20)
CREATININE: 3.7 mg/dL — AB (ref 0.61–1.24)
Calcium, Ion: 0.95 mmol/L — ABNORMAL LOW (ref 1.15–1.40)
Chloride: 96 mmol/L — ABNORMAL LOW (ref 101–111)
Glucose, Bld: 194 mg/dL — ABNORMAL HIGH (ref 65–99)
HEMATOCRIT: 28 % — AB (ref 39.0–52.0)
HEMOGLOBIN: 9.5 g/dL — AB (ref 13.0–17.0)
POTASSIUM: 4.1 mmol/L (ref 3.5–5.1)
Sodium: 135 mmol/L (ref 135–145)
TCO2: 27 mmol/L (ref 0–100)

## 2016-04-25 LAB — CBC
HCT: 30.1 % — ABNORMAL LOW (ref 39.0–52.0)
HEMOGLOBIN: 10 g/dL — AB (ref 13.0–17.0)
MCH: 28.1 pg (ref 26.0–34.0)
MCHC: 33.2 g/dL (ref 30.0–36.0)
MCV: 84.6 fL (ref 78.0–100.0)
Platelets: 341 10*3/uL (ref 150–400)
RBC: 3.56 MIL/uL — ABNORMAL LOW (ref 4.22–5.81)
RDW: 13.4 % (ref 11.5–15.5)
WBC: 11.9 10*3/uL — AB (ref 4.0–10.5)

## 2016-04-25 LAB — TYPE AND SCREEN
ABO/RH(D): A POS
Antibody Screen: NEGATIVE

## 2016-04-25 LAB — GLUCOSE, CAPILLARY
GLUCOSE-CAPILLARY: 119 mg/dL — AB (ref 65–99)
GLUCOSE-CAPILLARY: 177 mg/dL — AB (ref 65–99)
Glucose-Capillary: 125 mg/dL — ABNORMAL HIGH (ref 65–99)
Glucose-Capillary: 147 mg/dL — ABNORMAL HIGH (ref 65–99)

## 2016-04-25 MED ORDER — CALCIUM CARBONATE ANTACID 500 MG PO CHEW
2.0000 | CHEWABLE_TABLET | ORAL | Status: AC
  Administered 2016-04-25 (×2): 400 mg via ORAL

## 2016-04-25 MED ORDER — ACD FORMULA A 0.73-2.45-2.2 GM/100ML VI SOLN
Status: AC
  Administered 2016-04-25: 500 mL via INTRAVENOUS
  Filled 2016-04-25: qty 500

## 2016-04-25 MED ORDER — ALTEPLASE 2 MG IJ SOLR: 2.0000 mg | Freq: Once | INTRAMUSCULAR | Status: DC | PRN

## 2016-04-25 MED ORDER — PENTAFLUOROPROP-TETRAFLUOROETH EX AERO: 1.0000 "application " | INHALATION_SPRAY | CUTANEOUS | Status: DC | PRN

## 2016-04-25 MED ORDER — ACETAMINOPHEN 325 MG PO TABS: 650.0000 mg | ORAL_TABLET | ORAL | Status: DC | PRN

## 2016-04-25 MED ORDER — SODIUM CHLORIDE 0.9 % IV SOLN: 100.0000 mL | INTRAVENOUS | Status: DC | PRN

## 2016-04-25 MED ORDER — SODIUM CHLORIDE 0.9 % IV SOLN
Freq: Once | INTRAVENOUS | Status: AC
  Administered 2016-04-25: 19:00:00 via INTRAVENOUS_CENTRAL
  Filled 2016-04-25 (×5): qty 200

## 2016-04-25 MED ORDER — ANTICOAGULANT SODIUM CITRATE 4% (200MG/5ML) IV SOLN
5.0000 mL | Freq: Once | Status: AC | PRN
  Administered 2016-04-25: 5 mL via INTRAVENOUS
  Filled 2016-04-25: qty 250

## 2016-04-25 MED ORDER — SODIUM CHLORIDE 0.9 % IV SOLN
INTRAVENOUS | Status: AC
  Administered 2016-04-25 (×4): via INTRAVENOUS_CENTRAL
  Filled 2016-04-25: qty 200

## 2016-04-25 MED ORDER — CALCIUM CARBONATE ANTACID 500 MG PO CHEW
CHEWABLE_TABLET | ORAL | Status: AC
  Administered 2016-04-25: 400 mg via ORAL
  Filled 2016-04-25: qty 4

## 2016-04-25 MED ORDER — SODIUM CHLORIDE 0.9 % IV SOLN
4.0000 g | Freq: Once | INTRAVENOUS | Status: AC
  Administered 2016-04-25: 4 g via INTRAVENOUS
  Filled 2016-04-25: qty 40

## 2016-04-25 MED ORDER — ACD FORMULA A 0.73-2.45-2.2 GM/100ML VI SOLN
500.0000 mL | Status: DC
  Administered 2016-04-25 (×2): 500 mL via INTRAVENOUS
  Filled 2016-04-25: qty 500

## 2016-04-25 MED ORDER — INSULIN ASPART 100 UNIT/ML ~~LOC~~ SOLN
3.0000 [IU] | Freq: Three times a day (TID) | SUBCUTANEOUS | Status: DC
  Administered 2016-04-25 – 2016-05-13 (×36): 3 [IU] via SUBCUTANEOUS

## 2016-04-25 MED ORDER — HEPARIN SODIUM (PORCINE) 1000 UNIT/ML IJ SOLN: 1000.0000 [IU] | Freq: Once | INTRAMUSCULAR | Status: DC

## 2016-04-25 MED ORDER — LIDOCAINE-PRILOCAINE 2.5-2.5 % EX CREA: 1.0000 "application " | TOPICAL_CREAM | CUTANEOUS | Status: DC | PRN

## 2016-04-25 MED ORDER — DIPHENHYDRAMINE HCL 25 MG PO CAPS: 25.0000 mg | ORAL_CAPSULE | Freq: Four times a day (QID) | ORAL | Status: DC | PRN

## 2016-04-25 MED ORDER — HEPARIN SODIUM (PORCINE) 1000 UNIT/ML DIALYSIS: 1000.0000 [IU] | INTRAMUSCULAR | Status: DC | PRN

## 2016-04-25 MED ORDER — SODIUM CHLORIDE 0.9 % IV SOLN: Freq: Once | INTRAVENOUS | Status: DC

## 2016-04-25 MED ORDER — LIDOCAINE HCL (PF) 1 % IJ SOLN: 5.0000 mL | INTRAMUSCULAR | Status: DC | PRN

## 2016-04-25 NOTE — Progress Notes (Signed)
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 Minimal Scarring.  Given severe renal failue and now ANCA SVV RPGN, will initiate TPE:  1 PV QOD x7. Albumin as colloid. FFP 2u First TPE given Bx 04/20/16

## 2016-04-25 NOTE — Progress Notes (Signed)
Physical Therapy Treatment Patient Details Name: Nicholas Hudson MRN: [id number] DOB: 02/23/1941 Today's Date: 04/25/2016    History of Present Illness 76 y.o. male with medical history significant of PBH, gout, recurrent sinus infection, hypothyroidism. Patient with history of sinus, polyps  problems, infection for last 6 to 8 weeks, he has received 2 course of antibiotics by Dr Lucia Gaskins. Since 6 to 8 weeks ago he has been having headaches, less active, more sleepy/. Since 4 days ago he was  notice to be confuse, and lethargic by family .Mild leukocytosis worsening renal function and mild fever. Pt admitted for acute kidney injury. HD initiated 04-22-16.    PT Comments    Pt making good progress with mobility today, supervision with ambulation of 250 ft. PT to continue to follow to progress mobility and strength in anticipation of D/C to home following acute stay.   Follow Up Recommendations  No PT follow up;Supervision for mobility/OOB     Equipment Recommendations  None recommended by PT    Recommendations for Other Services       Precautions / Restrictions Precautions Precautions: Fall Restrictions Weight Bearing Restrictions: No    Mobility  Bed Mobility               General bed mobility comments: up in chair upon arrival  Transfers Overall transfer level: Needs assistance Equipment used: Rolling walker (2 wheeled) Transfers: Sit to/from Stand Sit to Stand: Supervision         General transfer comment: hands in lap  Ambulation/Gait Ambulation/Gait assistance: Supervision Ambulation Distance (Feet): 250 Feet Assistive device: Rolling walker (2 wheeled) Gait Pattern/deviations: Step-through pattern;Trunk flexed Gait velocity: mild decrease   General Gait Details: cues for posture   Stairs            Wheelchair Mobility    Modified Rankin (Stroke Patients Only)       Balance Overall balance assessment: Needs assistance Sitting-balance support:  No upper extremity supported Sitting balance-Leahy Scale: Good     Standing balance support: Bilateral upper extremity supported;During functional activity Standing balance-Leahy Scale: Fair Standing balance comment: rw during ambulation                    Cognition Arousal/Alertness: Awake/alert Behavior During Therapy: WFL for tasks assessed/performed Overall Cognitive Status: Within Functional Limits for tasks assessed                      Exercises Other Exercises Other Exercises: sit<>stand 1X10 Other Exercises: Band; curls, elbow extension, horizontal abduction - each 1X10    General Comments        Pertinent Vitals/Pain Pain Assessment: No/denies pain    Home Living                      Prior Function            PT Goals (current goals can now be found in the care plan section) Acute Rehab PT Goals Patient Stated Goal: get back to being himself again PT Goal Formulation: With patient Time For Goal Achievement: 05/08/16 Potential to Achieve Goals: Good Progress towards PT goals: Progressing toward goals    Frequency    Min 3X/week      PT Plan Current plan remains appropriate    Co-evaluation             End of Session   Activity Tolerance: Patient tolerated treatment well Patient left: in chair;with call bell/phone within  reach;with family/visitor present     Time: [id number] PT Time Calculation (min) (ACUTE ONLY): 21 min  Charges:  $Gait Training: 8-22 mins                    G Codes:      Cassell Clement, PT, CSCS Pager [phone] Office [phone]  04/25/2016, 3:26 PM

## 2016-04-25 NOTE — Progress Notes (Signed)
[facility] PROGRESS NOTE  Nicholas Hudson [id number] DOB: January 28, 1941 DOA: 04/16/2016  PCP: Kandice Hams, MD  Brief History/Interval Summary: Nicholas Hudson is an 76 y.o. male with medical history significant of PBH, gout, recurrent sinus infection, hypothyroidism. Patient with history of sinus, polyps problems, infection for last 6 to 8 weeks, he has received 2 course of antibiotics by Dr Lucia Gaskins. Patient was found to have worsening renal function. He was hospitalized for further management. Patient seen by nephrology. Renal function continued to get worse. 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 Patient started on dialysis.  Reason for Visit: Acute renal failure  Consultants: Nephrology  Procedures:  Renal biopsy 2/7  Placement of HD catheter 2/9  Echocardiogram Study Conclusions  - Left ventricle: The cavity size was normal. There was moderate   concentric hypertrophy. Systolic function was normal. The   estimated ejection fraction was in the range of 50% to 55%. Wall   motion was normal; there were no regional wall motion   abnormalities. Doppler parameters are consistent with abnormal   left ventricular relaxation (grade 1 diastolic dysfunction). - Aortic valve: Trileaflet; mildly thickened, mildly calcified   leaflets. - Aorta: Ascending aortic diameter: 42 mm (S). - Ascending aorta: The ascending aorta was mildly dilated. - Mitral valve: There was mild regurgitation. - Left atrium: The atrium was mildly dilated.  Hemodialysis  Antibiotics: Initially started on vancomycin and Zosyn. Both of these antibiotics have been discontinued.  Subjective/Interval History: Patient feels well. Denies any complaints. States that he had a dark colored bowel movement this morning. Denies any abdominal pain. No nausea or vomiting.   ROS: Denies any chest pain or shortness of breath  Objective:  Vital Signs  Vitals:   04/25/16 0905  04/25/16 0909 04/25/16 0930 04/25/16 1000  BP: (!) 159/71 (!) 151/78 (!) 146/75 132/66  Pulse: 71 71 74 66  Resp: 20     Temp: 97.5 F (36.4 C)     TempSrc: Oral     SpO2: 98%     Weight: 87.3 kg (192 lb 7.4 oz)     Height:        Intake/Output Summary (Last 24 hours) at 04/25/16 1137 Last data filed at 04/25/16 0900  Gross per 24 hour  Intake              360 ml  Output             1550 ml  Net            -1190 ml   Filed Weights   04/24/16 0500 04/24/16 2158 04/25/16 0905  Weight: 87.6 kg (193 lb 2 oz) 87.6 kg (193 lb 2 oz) 87.3 kg (192 lb 7.4 oz)    General appearance: alert, cooperative, appears stated age and no distress Resp: clear to auscultation bilaterally Cardio: regular rate and rhythm, S1, S2 normal, no murmur, click, rub or gallop GI: soft, non-tender; bowel sounds normal; no masses,  no organomegaly Extremities: Does have some edema bilateral lower extremity Neurologic: Awake and alert. No focal deficits.  Lab Results:  Data Reviewed: I have personally reviewed following labs and imaging studies  CBC:  Recent Labs Lab 04/21/16 0535 04/23/16 0519 04/25/16 0915  WBC 5.8 7.5 11.9*  HGB 9.2* 10.1* 10.0*  HCT 26.7* 29.2* 30.1*  MCV 82.9 83.4 84.6  PLT 299 308 [id number]    Basic Metabolic Panel:  Recent Labs Lab 04/19/16 0512  04/21/16 0535 04/22/16 0538 04/23/16  [id number] 04/24/16 0217 04/25/16 0511  NA 127*  < > 127* 128* 134* 133* 131*  K 4.0  < > 3.9 4.0 3.6 4.3 3.8  CL 97*  < > 93* 95* 96* 96* 97*  CO2 14*  < > 14* 13* 21* 24 22  GLUCOSE 272*  < > 152* 143* 75 174* 111*  BUN 63*  < > 94* 113* 78* 47* 62*  CREATININE 6.29*  < > 7.66* 8.57* 6.44* 4.69* 5.47*  CALCIUM 7.0*  < > 7.1* 7.1* 7.0* 7.2* 7.3*  PHOS 8.9*  --   --  12.3* 8.4* 6.1* 7.1*  < > = values in this interval not displayed.  GFR: Estimated Creatinine Clearance: 11.5 mL/min (by C-G formula based on SCr of 5.47 mg/dL (H)).  Liver Function Tests:  Recent Labs Lab 04/19/16 0512  04/22/16 0538 04/23/16 0519 04/24/16 0217 04/25/16 0511  ALBUMIN 2.1* 1.9* 1.7* 1.8* 2.0*    CBG:  Recent Labs Lab 04/24/16 0753 04/24/16 1144 04/24/16 1657 04/24/16 2036 04/25/16 0800  GLUCAP 155* 184* 163* 177* 125*     Recent Results (from the past 240 hour(s))  Culture, blood (Routine x 2)     Status: None   Collection Time: 04/16/16  3:32 PM  Result Value Ref Range Status   Specimen Description BLOOD RIGHT WRIST  Final   Special Requests BOTTLES DRAWN AEROBIC AND ANAEROBIC 5 CC EACH  Final   Culture   Final    NO GROWTH 5 DAYS Performed at [facility], [place] [address], [place], [place] [address]    Report Status 04/21/2016 FINAL  Final  Culture, blood (Routine x 2)     Status: None   Collection Time: 04/16/16  4:15 PM  Result Value Ref Range Status   Specimen Description BLOOD RIGHT HAND  Final   Special Requests BOTTLES DRAWN AEROBIC ONLY 5 CC  Final   Culture   Final    NO GROWTH 5 DAYS Performed at [facility], [place] [address], [place], [place] [address]    Report Status 04/21/2016 FINAL  Final  Urine culture     Status: None   Collection Time: 04/16/16  4:23 PM  Result Value Ref Range Status   Specimen Description URINE, RANDOM  Final   Special Requests NONE  Final   Culture   Final    NO GROWTH Performed at [facility], [place] [address], [place], [place] [address]    Report Status 04/18/2016 FINAL  Final  Culture, blood (x 2)     Status: None   Collection Time: 04/17/16  8:19 AM  Result Value Ref Range Status   Specimen Description BLOOD LEFT ANTECUBITAL  Final   Special Requests BOTTLES DRAWN AEROBIC AND ANAEROBIC 5CC  Final   Culture   Final    NO GROWTH 5 DAYS Performed at [facility], [place] [address] [place], [place] [address]    Report Status 04/22/2016 FINAL  Final  Culture, blood (x 2)     Status: None   Collection Time: 04/17/16  8:27 AM  Result Value Ref Range Status   Specimen Description BLOOD RIGHT HAND   Final   Special Requests BOTTLES DRAWN AEROBIC AND ANAEROBIC 5CC  Final   Culture   Final    NO GROWTH 5 DAYS Performed at [facility], [place] [address], [place], [place] [address]    Report Status 04/22/2016 FINAL  Final  Radiology Studies: No results found.   Medications:  Scheduled: . aspirin EC  81 mg Oral Daily  . atorvastatin  40 mg Oral q1800  . cyclophosphamide  75 mg Oral Daily  . finasteride  5 mg Oral Daily  . gabapentin  100 mg Oral QHS  . insulin aspart  0-15 Units Subcutaneous TID WC  . insulin aspart  0-5 Units Subcutaneous QHS  . insulin aspart  4 Units Subcutaneous TID WC  . insulin detemir  5 Units Subcutaneous Daily  . levothyroxine  200 mcg Oral QAC breakfast  . predniSONE  60 mg Oral Q breakfast  . senna  1 tablet Oral BID  . 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 His creatinine was 1.0 about a year ago. Nephrology was consulted. Autoimmune workup is positive for an elevated myeloperoxidase antibody. 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 Patient transferred over to this facility for further management. Renal biopsy was performed 2/7. 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  Final report is pending. Patient started on cyclophosphamide and Bactrim for prophylaxis. Nephrology is following and managing. He was also given high-dose steroids. Now on oral prednisone. Being dialyzed based on renal function.  Dark colored stool Etiology is unclear. He had already flushed the toilet. Check stool for occult blood. Hemoglobin checked after his bowel movement is stable. Could this be due to some medication? Monitor closely for now.  Sepsis of unclear source Culture data negative till date. Repeat chest x-ray is negative. Patient was initially started on the sepsis pathway. He was given vancomycin and Zosyn. Vancomycin was discontinued due to renal failure. No clear infectious foci. MRI brain did not show any acute abnormalities. MRI abdomen did not show any intra-abdominal process. Zosyn was discontinued as well. Patient could've had a viral syndrome. MRI brain did show paranasal sinus disease and mastoid fluid. However patient has recently completed multiple courses of antibiotics for same. Continue to monitor off of antibiotics. Sepsis physiology, resolved. WBC is normal.  Metabolic encephalopathy Resolved. MRI brain did not show any acute process. Confusion could have been due to acute renal failure.   Elevated alkaline phosphatase Patient noted an elevated alkaline phosphatase. AST, ALT normal. Bilirubin normal. Abdominal ultrasound showed possible pancreatic mass. MRI did not show any lesion in the pancreas. Finding on the ultrasound was thought to be artifactual from bowel. Outpatient follow-up for elevated alkaline phosphatase.  Hypothyroidism Continue Synthroid.  Hyponatremia Has improved after dialysis.  Hyperglycemia No history of diabetes as outpatient. Hyperglycemia is likely due to steroids. Since he is now just on oral steroids, we decreased the dose of his insulin. HbA1c was 6.5. Continue to monitor CBGs and adjust dose of insulin as needed.   DVT  Prophylaxis: SCDs  Code Status: Full code  Family Communication: Discussed with patient Disposition Plan: Management as discussed above. Mobilize.    LOS: 9 days   [facility] Pager [phone] 04/25/2016, 11:37  AM  If 7PM-7AM, please contact night-coverage at [email or web address], password [facility]

## 2016-04-25 NOTE — Progress Notes (Signed)
Placed patient on CPAP for the night with pressure set at 4cm  

## 2016-04-25 NOTE — Progress Notes (Signed)
PT Cancellation Note  Patient Details Name: AOUS MODEL MRN: [id number] DOB: [birth year]   Cancelled Treatment:    Reason Eval/Treat Not Completed: Patient at procedure or test/unavailable. Pt at HD, will follow as able.    Cassell Clement, PT, CSCS Pager [phone] Office [phone]  04/25/2016, 10:13 AM

## 2016-04-25 NOTE — Care Management Note (Signed)
Case Management Note  Patient Details  Name: Nicholas Hudson MRN: [id number] Date of Birth: 1940/03/21  Subjective/Objective:                 Patient admitted from home with Acute GN requiring intermittent HD, steroids.    Action/Plan:  CM will continue to follow.   Expected Discharge Date:  04/25/16               Expected Discharge Plan:  Home/Self Care  In-House Referral:  NA  Discharge planning Services  NA  Post Acute Care Choice:  NA Choice offered to:  NA  DME Arranged:  [name] DME Agency:     HH Arranged:    HH Agency:     Status of Service:  In process, will continue to follow  If discussed at Long Length of Stay Meetings, dates discussed:    Additional Comments:  Carles Collet, RN 04/25/2016, 1:34 PM

## 2016-04-25 NOTE — Procedures (Signed)
I was present at this dialysis session. I have reviewed the session itself and made appropriate changes.     HD#3  Pt with RPGN, dialysis dependent.  4k BATH.  2L UF goal.    Has rec pulse solumedrol, now on 60mg /d prednisone.  On PO cytoxan.    GBM neg, C3 and C4 WNL.  MPO Ab + at 29.  S/p renal Bx 2/7, result pending.  If confirms pauci immune GN with minimal scarring need to consider TPE +/- Rituximab.   Filed Weights   04/24/16 0500 04/24/16 2158 04/25/16 0905  Weight: 87.6 kg (193 lb 2 oz) 87.6 kg (193 lb 2 oz) 87.3 kg (192 lb 7.4 oz)     Recent Labs Lab 04/25/16 0511  NA 131*  K 3.8  CL 97*  CO2 22  GLUCOSE 111*  BUN 62*  CREATININE 5.47*  CALCIUM 7.3*  PHOS 7.1*     Recent Labs Lab 04/21/16 0535 04/23/16 0519 04/25/16 0915  WBC 5.8 7.5 11.9*  HGB 9.2* 10.1* 10.0*  HCT 26.7* 29.2* 30.1*  MCV 82.9 83.4 84.6  PLT 299 308 341    Scheduled Meds: . aspirin EC  81 mg Oral Daily  . atorvastatin  40 mg Oral q1800  . cyclophosphamide  75 mg Oral Daily  . finasteride  5 mg Oral Daily  . gabapentin  100 mg Oral QHS  . insulin aspart  0-15 Units Subcutaneous TID WC  . insulin aspart  0-5 Units Subcutaneous QHS  . insulin aspart  3 Units Subcutaneous TID WC  . insulin detemir  5 Units Subcutaneous Daily  . levothyroxine  200 mcg Oral QAC breakfast  . predniSONE  60 mg Oral Q breakfast  . senna  1 tablet Oral BID  . sulfamethoxazole-trimethoprim  1 tablet Oral Q M,W,F-1800   Continuous Infusions: PRN Meds:.sodium chloride, sodium chloride, acetaminophen **OR** acetaminophen, alteplase, fentaNYL, heparin, lidocaine (PF), lidocaine-prilocaine, midazolam, ondansetron **OR** ondansetron (ZOFRAN) IV, pentafluoroprop-tetrafluoroeth, traMADol, zolpidem   Pearson Grippe  MD 04/25/2016, 12:28 PM

## 2016-04-25 NOTE — Care Management Important Message (Signed)
Important Message  Patient Details  Name: Nicholas Hudson MRN: [id number] Date of Birth: Oct 29, 1940   Medicare Important Message Given:  Yes    Mistey Hoffert [address] 04/25/2016, 4:37 PM

## 2016-04-26 DIAGNOSIS — R030 Elevated blood-pressure reading, without diagnosis of hypertension: Secondary | ICD-10-CM

## 2016-04-26 LAB — PREPARE FRESH FROZEN PLASMA
UNIT DIVISION: 0
Unit division: 0

## 2016-04-26 LAB — RENAL FUNCTION PANEL
ALBUMIN: 3.6 g/dL (ref 3.5–5.0)
Anion gap: 11 (ref 5–15)
BUN: 33 mg/dL — AB (ref 6–20)
CALCIUM: 7.8 mg/dL — AB (ref 8.9–10.3)
CO2: 23 mmol/L (ref 22–32)
CREATININE: 3.66 mg/dL — AB (ref 0.61–1.24)
Chloride: 104 mmol/L (ref 101–111)
GFR calc Af Amer: 17 mL/min — ABNORMAL LOW (ref >60)
GFR calc non Af Amer: 15 mL/min — ABNORMAL LOW (ref >60)
Glucose, Bld: 113 mg/dL — ABNORMAL HIGH (ref 65–99)
PHOSPHORUS: 5.2 mg/dL — AB (ref 2.5–4.6)
POTASSIUM: 4.2 mmol/L (ref 3.5–5.1)
Sodium: 138 mmol/L (ref 135–145)

## 2016-04-26 LAB — GLUCOSE, CAPILLARY
Glucose-Capillary: 105 mg/dL — ABNORMAL HIGH (ref 65–99)
Glucose-Capillary: 216 mg/dL — ABNORMAL HIGH (ref 65–99)
Glucose-Capillary: 151 mg/dL — ABNORMAL HIGH (ref 65–99)
Glucose-Capillary: 133 mg/dL — ABNORMAL HIGH (ref 65–99)

## 2016-04-26 LAB — OCCULT BLOOD X 1 CARD TO LAB, STOOL: Fecal Occult Bld: POSITIVE — AB

## 2016-04-26 LAB — MRSA PCR SCREENING: MRSA by PCR: NEGATIVE

## 2016-04-26 NOTE — Progress Notes (Signed)
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 sodium chloride   Intravenous Once  . aspirin EC  81 mg Oral Daily  . atorvastatin  40 mg Oral q1800  . cyclophosphamide  75 mg Oral Daily  . finasteride  5 mg Oral Daily  . gabapentin  100 mg Oral QHS  . insulin aspart  0-15 Units Subcutaneous TID WC  . insulin aspart  0-5 Units Subcutaneous QHS  . insulin aspart  3 Units Subcutaneous TID WC  . insulin detemir  5 Units Subcutaneous Daily  . levothyroxine  200 mcg Oral QAC breakfast  . predniSONE  60 mg Oral Q breakfast  . senna  1 tablet Oral BID  . sulfamethoxazole-trimethoprim  1 tablet Oral Q M,W,F-1800   Continuous Infusions: PRN Meds:.acetaminophen **OR** acetaminophen, fentaNYL, midazolam, ondansetron **OR** ondansetron (ZOFRAN) IV, traMADol, zolpidem  Current Labs: reviewed  MPO ANCA + 29.1 Neg: ANA, ASO, GBM, nl C3+C4  Physical Exam:  Blood pressure (!) 172/73, pulse 81, temperature 97.7 F (36.5 C), temperature source Oral, resp. rate 18, height 5\' 4"  (1.626 m), weight 84.8 kg (187 lb), SpO2 95 %. RRR R IJ TDC C/D/I CTAB S/nt Trace LEE Nonfocal NCAT EOMI  A 1. 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 Minimal Scarring. 2. Completed pulse steroids; Now on Pred 60mg /d 3. PO Cytxoan 4. TPE 1.5PV x7, QOD; Albumin colloid 5. HD as needed 6. DS TMP/SMX TIW PCP prophylaxis 2. HLD 3. Gout  P 1. Cont current regimen, monitor for changes/improvement in renal function.  Next TPE  tomorrow 2. Daily assessment for HD need   Pearson Grippe MD 04/26/2016, 8:44 AM   Recent Labs Lab 04/23/16 0519 04/24/16 0217 04/25/16 0511 04/25/16 1843  NA 134* 133* 131* 135  K 3.6 4.3 3.8 4.1  CL 96* 96* 97* 96*  CO2 21* 24 22  --   GLUCOSE 75 174* 111* 194*  BUN 78* 47* 62* 30*  CREATININE 6.44* 4.69* 5.47* 3.70*  CALCIUM 7.0* 7.2* 7.3*  --   PHOS 8.4* 6.1* 7.1*  --     Recent Labs Lab 04/21/16 0535 04/23/16 0519 04/25/16 0915 04/25/16 1843  WBC 5.8 7.5 11.9*  --   HGB 9.2* 10.1* 10.0* 9.5*  HCT 26.7* 29.2* 30.1* 28.0*  MCV 82.9 83.4 84.6  --   PLT 299 308 341  --

## 2016-04-26 NOTE — Progress Notes (Signed)
Physical Therapy Treatment Patient Details Name: Nicholas Hudson MRN: [id number] DOB: [birth year] Today's Date: 04/26/2016    History of Present Illness 76 y.o. male with medical history significant of PBH, gout, recurrent sinus infection, hypothyroidism. Patient with history of sinus, polyps  problems, infection for last 6 to 8 weeks, he has received 2 course of antibiotics by Dr Lucia Gaskins. Since 6 to 8 weeks ago he has been having headaches, less active, more sleepy/. Since 4 days ago he was  notice to be confuse, and lethargic by family .Mild leukocytosis worsening renal function and mild fever. Pt admitted for acute kidney injury. HD initiated 04-22-16.    PT Comments    Pt doing very well with mobility overall. Pt states that he is feeling more tired today but still able to ambulate 300 ft with rw. PT to continue to follow acutely in anticipation of D/C to home.   Follow Up Recommendations  No PT follow up;Supervision for mobility/OOB     Equipment Recommendations  None recommended by PT    Recommendations for Other Services       Precautions / Restrictions Precautions Precautions: Fall Restrictions Weight Bearing Restrictions: No    Mobility  Bed Mobility Overal bed mobility: Modified Independent             General bed mobility comments: supine to sitting- using rail to assist  Transfers Overall transfer level: Needs assistance Equipment used: Rolling walker (2 wheeled) Transfers: Sit to/from Stand Sit to Stand: Supervision Stand pivot transfers: Supervision       General transfer comment: supervision for safety  Ambulation/Gait Ambulation/Gait assistance: Supervision Ambulation Distance (Feet): 300 Feet Assistive device: Rolling walker (2 wheeled) Gait Pattern/deviations: Step-through pattern Gait velocity: mild decrease   General Gait Details: cues for posture   Stairs            Wheelchair Mobility    Modified Rankin (Stroke Patients Only)        Balance Overall balance assessment: Needs assistance Sitting-balance support: No upper extremity supported Sitting balance-Leahy Scale: Good     Standing balance support: During functional activity Standing balance-Leahy Scale: Fair Standing balance comment: rw during ambulation                    Cognition Arousal/Alertness: Awake/alert Behavior During Therapy: WFL for tasks assessed/performed Overall Cognitive Status: Within Functional Limits for tasks assessed                      Exercises Other Exercises Other Exercises: sit<>stand 1X10    General Comments        Pertinent Vitals/Pain Pain Assessment: No/denies pain    Home Living                      Prior Function            PT Goals (current goals can now be found in the care plan section) Acute Rehab PT Goals Patient Stated Goal: keep strength, get kidneys working PT Goal Formulation: With patient Time For Goal Achievement: 05/08/16 Potential to Achieve Goals: Good Progress towards PT goals: Progressing toward goals    Frequency    Min 3X/week      PT Plan Current plan remains appropriate    Co-evaluation             End of Session   Activity Tolerance: Patient tolerated treatment well Patient left: in bed;with call bell/phone within reach;with family/visitor present  Time: 1203-1223 PT Time Calculation (min) (ACUTE ONLY): 20 min  Charges:  $Gait Training: 8-22 mins                    G Codes:      Cassell Clement, PT, CSCS Pager [phone] Office [phone]  04/26/2016, 1:16 PM

## 2016-04-26 NOTE — Progress Notes (Signed)
Pt. Places cpap on himself. RT informed pt. To notify if he needed any assistance.

## 2016-04-26 NOTE — Plan of Care (Signed)
Problem: Food- and Nutrition-Related Knowledge Deficit (NB-1.1) Goal: Nutrition education Formal process to instruct or train a patient/client in a skill or to impart knowledge to help patients/clients voluntarily manage or modify food choices and eating behavior to maintain or improve health. Outcome: Completed/Met Date Met: 04/26/16 Nutrition Education Note  RD consulted for Renal Education. Provided "Eating Healthy with Kidney Disease"to patient/family. Reviewed food groups and provided written recommended serving sizes specifically determined for patient's current nutritional status.   Explained why diet restrictions are needed and provided lists of foods to limit/avoid that are high potassium, sodium, and phosphorus. Provided specific recommendations on safer alternatives of these foods. Strongly encouraged compliance of this diet.   Discussed importance of protein intake at each meal and snack. Provided examples of how to maximize protein intake throughout the day. Teach back method used.  Expect good compliance.  Body mass index is 32.1 kg/m. Pt meets criteria for class I obesity based on current BMI.  Current diet order is renal/carbohydrate modified, patient is consuming approximately 100% of meals at this time. Labs and medications reviewed. No further nutrition interventions warranted at this time. RD contact information provided. If additional nutrition issues arise, please re-consult RD.  Corrin Parker, MS, RD, LDN Pager # [phone] After hours/ weekend pager # [phone]

## 2016-04-26 NOTE — Progress Notes (Signed)
[facility] PROGRESS NOTE  DONTERIO PAPPALARDO [id number] DOB: Jun 12, 1940 DOA: 04/16/2016  PCP: Kandice Hams, MD  Brief History/Interval Summary: ZARIAN FENNELL is an 76 y.o. male with medical history significant of PBH, gout, recurrent sinus infection, hypothyroidism. Patient with history of sinus, polyps problems, infection for last 6 to 8 weeks, he has received 2 course of antibiotics by Dr Lucia Gaskins. Patient was found to have worsening renal function. He was hospitalized for further management. Patient seen by nephrology. Renal function continued to get worse. 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 Patient started on dialysis. Also started on plasmapheresis.  Reason for Visit: Acute renal failure  Consultants: Nephrology  Procedures:  Renal biopsy 2/7  Placement of HD catheter 2/9  Echocardiogram Study Conclusions  - Left ventricle: The cavity size was normal. There was moderate   concentric hypertrophy. Systolic function was normal. The   estimated ejection fraction was in the range of 50% to 55%. Wall   motion was normal; there were no regional wall motion   abnormalities. Doppler parameters are consistent with abnormal   left ventricular relaxation (grade 1 diastolic dysfunction). - Aortic valve: Trileaflet; mildly thickened, mildly calcified   leaflets. - Aorta: Ascending aortic diameter: 42 mm (S). - Ascending aorta: The ascending aorta was mildly dilated. - Mitral valve: There was mild regurgitation. - Left atrium: The atrium was mildly dilated.  Hemodialysis  Plasmapheresis starting 2/12  Antibiotics: Initially started on vancomycin and Zosyn. Both of these antibiotics have been discontinued.  Subjective/Interval History: Patient continues to feel well. His significant other is at the bedside. No nausea, vomiting.    ROS: Denies any chest pain or shortness of breath  Objective:  Vital Signs  Vitals:   04/25/16 2125  04/25/16 2230 04/25/16 2339 04/26/16 1023  BP: (!) 152/72 (!) 172/73  (!) 155/69  Pulse: 71 78 81 72  Resp: 19 18 18 18   Temp: 98.1 F (36.7 C) 97.7 F (36.5 C)  97.5 F (36.4 C)  TempSrc: Oral Oral  Oral  SpO2: 98% 99% 95% 100%  Weight:  84.8 kg (187 lb)    Height:  5\' 4"  (1.626 m)      Intake/Output Summary (Last 24 hours) at 04/26/16 1127 Last data filed at 04/26/16 1022  Gross per 24 hour  Intake              480 ml  Output             3100 ml  Net            -2620 ml   Filed Weights   04/25/16 0905 04/25/16 1244 04/25/16 2230  Weight: 87.3 kg (192 lb 7.4 oz) 85 kg (187 lb 6.3 oz) 84.8 kg (187 lb)    General appearance: alert, cooperative, appears stated age and no distress Resp: clear to auscultation bilaterally Cardio: regular rate and rhythm, S1, S2 normal, no murmur, click, rub or gallop GI: soft, non-tender; bowel sounds normal; no masses,  no organomegaly Extremities: Does have some edema bilateral lower extremity Neurologic: Awake and alert. No focal deficits.  Lab Results:  Data Reviewed: I have personally reviewed following labs and imaging studies  CBC:  Recent Labs Lab 04/21/16 0535 04/23/16 0519 04/25/16 0915 04/25/16 1843  WBC 5.8 7.5 11.9*  --   HGB 9.2* 10.1* 10.0* 9.5*  HCT 26.7* 29.2* 30.1* 28.0*  MCV 82.9 83.4 84.6  --   PLT 299 308 341  --  Basic Metabolic Panel:  Recent Labs Lab 04/22/16 0538 04/23/16 0519 04/24/16 0217 04/25/16 0511 04/25/16 1843 04/26/16 0735  NA 128* 134* 133* 131* 135 138  K 4.0 3.6 4.3 3.8 4.1 4.2  CL 95* 96* 96* 97* 96* 104  CO2 13* 21* 24 22  --  23  GLUCOSE 143* 75 174* 111* 194* 113*  BUN 113* 78* 47* 62* 30* 33*  CREATININE 8.57* 6.44* 4.69* 5.47* 3.70* 3.66*  CALCIUM 7.1* 7.0* 7.2* 7.3*  --  7.8*  PHOS 12.3* 8.4* 6.1* 7.1*  --  5.2*    GFR: Estimated Creatinine Clearance: 16.6 mL/min (by C-G formula based on SCr of 3.66 mg/dL (H)).  Liver Function Tests:  Recent Labs Lab  04/22/16 0538 04/23/16 0519 04/24/16 0217 04/25/16 0511 04/26/16 0735  ALBUMIN 1.9* 1.7* 1.8* 2.0* 3.6    CBG:  Recent Labs Lab 04/25/16 0800 04/25/16 1352 04/25/16 1730 04/25/16 2237 04/26/16 0755  GLUCAP 125* 119* 177* 147* 105*     Recent Results (from the past 240 hour(s))  Culture, blood (Routine x 2)     Status: None   Collection Time: 04/16/16  3:32 PM  Result Value Ref Range Status   Specimen Description BLOOD RIGHT WRIST  Final   Special Requests BOTTLES DRAWN AEROBIC AND ANAEROBIC 5 CC EACH  Final   Culture   Final    NO GROWTH 5 DAYS Performed at [facility], [place] [address], [place], [place] [address]    Report Status 04/21/2016 FINAL  Final  Culture, blood (Routine x 2)     Status: None   Collection Time: 04/16/16  4:15 PM  Result Value Ref Range Status   Specimen Description BLOOD RIGHT HAND  Final   Special Requests BOTTLES DRAWN AEROBIC ONLY 5 CC  Final   Culture   Final    NO GROWTH 5 DAYS Performed at [facility], [place] [address] [place], [place] [address]    Report Status 04/21/2016 FINAL  Final  Urine culture     Status: None   Collection Time: 04/16/16  4:23 PM  Result Value Ref Range Status   Specimen Description URINE, RANDOM  Final   Special Requests NONE  Final   Culture   Final    NO GROWTH Performed at [facility], [place] [address], [place], [place] [address]    Report Status 04/18/2016 FINAL  Final  Culture, blood (x 2)     Status: None   Collection Time: 04/17/16  8:19 AM  Result Value Ref Range Status   Specimen Description BLOOD LEFT ANTECUBITAL  Final   Special Requests BOTTLES DRAWN AEROBIC AND ANAEROBIC 5CC  Final   Culture   Final    NO GROWTH 5 DAYS Performed at [facility], [place] [address], [place], [place] [address]    Report Status 04/22/2016 FINAL  Final  Culture, blood (x 2)     Status: None   Collection Time: 04/17/16  8:27 AM  Result Value Ref Range Status   Specimen Description BLOOD  RIGHT HAND  Final   Special Requests BOTTLES DRAWN AEROBIC AND ANAEROBIC 5CC  Final   Culture   Final    NO GROWTH 5 DAYS Performed at [facility], [place] [address] [place], [place] [address]    Report Status 04/22/2016 FINAL  Final      Radiology Studies: No results found.   Medications:  Scheduled: . sodium chloride   Intravenous Once  .  aspirin EC  81 mg Oral Daily  . atorvastatin  40 mg Oral q1800  . cyclophosphamide  75 mg Oral Daily  . finasteride  5 mg Oral Daily  . gabapentin  100 mg Oral QHS  . insulin aspart  0-15 Units Subcutaneous TID WC  . insulin aspart  0-5 Units Subcutaneous QHS  . insulin aspart  3 Units Subcutaneous TID WC  . insulin detemir  5 Units Subcutaneous Daily  . levothyroxine  200 mcg Oral QAC breakfast  . predniSONE  60 mg Oral Q breakfast  . senna  1 tablet Oral BID  . 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 His creatinine was 1.0 about a year ago. Nephrology was consulted. Autoimmune workup is positive for an elevated myeloperoxidase antibody. 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 Patient transferred over to this facility for further management. Renal biopsy was performed 2/7. 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 Final report is pending. Patient started on cyclophosphamide and Bactrim  for prophylaxis. Nephrology is following and managing. He was also given high-dose steroids. Now on oral prednisone. Being dialyzed based on renal function.Also, on plasmapheresis now starting 2/12.  Dark colored stool Patient had one episode of dark stool on 2/12. Hasn't had any further episodes. Hemoglobin is stable. Stool for occult blood is pending. Continue to monitor for now.   Sepsis of unclear source Culture data negative till date. Repeat chest x-ray is negative. Patient was initially started on the sepsis pathway. He was given vancomycin and Zosyn. Vancomycin was discontinued due to renal failure. No clear infectious foci. MRI brain did not show any acute abnormalities. MRI abdomen did not show any intra-abdominal process. Zosyn was discontinued as well. Patient could've had a viral syndrome. MRI brain did show paranasal sinus disease and mastoid fluid. However patient has recently completed multiple courses of antibiotics for same. Continue to monitor off of antibiotics. Sepsis physiology, resolved. WBC is normal.  Metabolic encephalopathy Resolved. MRI brain did not show any acute process. Confusion could have been due to acute renal failure.   Elevated alkaline phosphatase Patient noted an elevated alkaline phosphatase. AST, ALT normal. Bilirubin normal. Abdominal ultrasound showed possible pancreatic mass. MRI did not show any lesion in the pancreas. Finding on the ultrasound was thought to be artifactual from bowel. Outpatient follow-up for elevated alkaline phosphatase.  Elevated blood pressure Patient does not seem to be on any blood pressure lowering agents at home. Blood pressure could be elevated due to effect of steroid. Continue to monitor for now. If remains persistently elevated, may have to initiate antihypertensive agent.  Hyperglycemia No history of diabetes as outpatient. Hyperglycemia is likely due to steroids. Since he is now just on oral steroids, we decreased the  dose of his insulin. HbA1c was 6.5. Continue to monitor CBGs and adjust dose of insulin as needed.   Hypothyroidism Continue Synthroid.  Hyponatremia Has improved after dialysis.  DVT Prophylaxis: SCDs  Code Status: Full code  Family Communication: Discussed with patient  Disposition Plan: Management as discussed above. Mobilize.    LOS: 10 days   [facility] Pager [phone] 04/26/2016, 11:27 AM  If 7PM-7AM, please contact night-coverage at [email or web address], password [facility]

## 2016-04-26 NOTE — Progress Notes (Addendum)
Occupational Therapy Treatment & D/C Patient Details Name: Nicholas Hudson MRN: [id number] DOB: 1940-06-15 Today's Date: 04/26/2016    History of present illness 76 y.o. male with medical history significant of PBH, gout, recurrent sinus infection, hypothyroidism. Patient with history of sinus, polyps  problems, infection for last 6 to 8 weeks, he has received 2 course of antibiotics by Dr Lucia Gaskins. Since 6 to 8 weeks ago he has been having headaches, less active, more sleepy/. Since 4 days ago he was  notice to be confuse, and lethargic by family .Mild leukocytosis worsening renal function and mild fever. Pt admitted for acute kidney injury. HD initiated 04-22-16.   OT comments  Pt making excellent progress. Completed education regarding safety, energy conservation and reducing risk of falls for ADL and functional mobility for ADL. OT signing off at this time.   Follow Up Recommendations  No OT follow up;Supervision - Intermittent;Other (comment)    Equipment Recommendations  3 in 1 bedside commode    Recommendations for Other Services      Precautions / Restrictions Precautions Precautions: Fall       Mobility Bed Mobility Overal bed mobility: Modified Independent                Transfers Overall transfer level: Needs assistance Equipment used: Rolling walker (2 wheeled) Transfers: Sit to/from Stand Sit to Stand: Supervision Stand pivot transfers: Supervision            Balance Overall balance assessment: Needs assistance   Sitting balance-Leahy Scale: Good       Standing balance-Leahy Scale: Fair                     ADL Overall ADL's : Needs assistance/impaired                                 Tub/ Shower Transfer: Tub transfer;Cueing for Immunologist Details (indicate cue type and reason): Educated pt/wife on tub transfer technique and how to use 3in1 as shower seat Functional mobility during ADLs:  Supervision/safety;Rolling walker General ADL Comments: Educated pt/wife of energy conservation adn reducing risk of falls at home. Handout reviewed. Wife able to assist with ADL as needed, but pt able ot complete with set up. Pt/wife verbalized understanding of information.      Vision                     Perception     Praxis      Cognition   Behavior During Therapy: WFL for tasks assessed/performed Overall Cognitive Status: Within Functional Limits for tasks assessed                       Extremity/Trunk Assessment     Has theraband for general strenghtening BUE          Exercises     Shoulder Instructions       General Comments      Pertinent Vitals/ Pain       Pain Assessment: No/denies pain  Home Living                                          Prior Functioning/Environment              Frequency  Progress Toward Goals  OT Goals(current goals can now be found in the care plan section)  Progress towards OT goals: Goals met/education completed, patient discharged from OT  Acute Rehab OT Goals Patient Stated Goal: get back to being himself again OT Goal Formulation: With patient Time For Goal Achievement: 05/01/16 Potential to Achieve Goals: Good ADL Goals Pt Will Perform Grooming: standing;with supervision Pt Will Perform Upper Body Dressing: with modified independence;sitting Pt Will Perform Lower Body Dressing: with modified independence;sit to/from stand Pt Will Perform Tub/Shower Transfer: Tub transfer;with supervision;ambulating;3 in 1;rolling walker Additional ADL Goal #1: Pt will independently verbalize 2 energy conservation strategies to incorporate into ADLs.   Plan All goals met and education completed, patient discharged from OT services    Co-evaluation                 End of Session Equipment Utilized During Treatment: Gait belt;Rolling walker   Activity Tolerance Patient  tolerated treatment well   Patient Left in bed;with call bell/phone within reach;with family/visitor present   Nurse Communication Mobility status        Time: 6438-3779 OT Time Calculation (min): 20 min  Charges: OT General Charges $OT Visit: 1 Procedure OT Treatments $Self Care/Home Management : 8-22 mins  Derisha Funderburke,HILLARY 04/26/2016, 12:59 PM   [facility], OT/L  [phone] 04/26/2016

## 2016-04-27 ENCOUNTER — Encounter (HOSPITAL_COMMUNITY): Payer: Self-pay

## 2016-04-27 LAB — BASIC METABOLIC PANEL
ANION GAP: 11 (ref 5–15)
BUN: 43 mg/dL — ABNORMAL HIGH (ref 6–20)
CALCIUM: 7.5 mg/dL — AB (ref 8.9–10.3)
CO2: 23 mmol/L (ref 22–32)
Chloride: 100 mmol/L — ABNORMAL LOW (ref 101–111)
Creatinine, Ser: 4.4 mg/dL — ABNORMAL HIGH (ref 0.61–1.24)
GFR, EST AFRICAN AMERICAN: 14 mL/min — AB (ref >60)
GFR, EST NON AFRICAN AMERICAN: 12 mL/min — AB (ref >60)
Glucose, Bld: 124 mg/dL — ABNORMAL HIGH (ref 65–99)
Potassium: 4.3 mmol/L (ref 3.5–5.1)
SODIUM: 134 mmol/L — AB (ref 135–145)

## 2016-04-27 LAB — POCT I-STAT, CHEM 8
BUN: 47 mg/dL — ABNORMAL HIGH (ref 6–20)
CREATININE: 4.9 mg/dL — AB (ref 0.61–1.24)
Calcium, Ion: 1.09 mmol/L — ABNORMAL LOW (ref 1.15–1.40)
Chloride: 97 mmol/L — ABNORMAL LOW (ref 101–111)
GLUCOSE: 185 mg/dL — AB (ref 65–99)
HCT: 26 % — ABNORMAL LOW (ref 39.0–52.0)
HEMOGLOBIN: 8.8 g/dL — AB (ref 13.0–17.0)
Potassium: 4.6 mmol/L (ref 3.5–5.1)
Sodium: 134 mmol/L — ABNORMAL LOW (ref 135–145)
TCO2: 22 mmol/L (ref 0–100)

## 2016-04-27 LAB — GLUCOSE, CAPILLARY
GLUCOSE-CAPILLARY: 100 mg/dL — AB (ref 65–99)
GLUCOSE-CAPILLARY: 135 mg/dL — AB (ref 65–99)
GLUCOSE-CAPILLARY: 145 mg/dL — AB (ref 65–99)
GLUCOSE-CAPILLARY: 160 mg/dL — AB (ref 65–99)

## 2016-04-27 LAB — RETICULOCYTES
RBC.: 3.16 MIL/uL — AB (ref 4.22–5.81)
RETIC COUNT ABSOLUTE: 44.2 10*3/uL (ref 19.0–186.0)
RETIC CT PCT: 1.4 % (ref 0.4–3.1)

## 2016-04-27 LAB — CBC
HCT: 25.9 % — ABNORMAL LOW (ref 39.0–52.0)
HCT: 27.6 % — ABNORMAL LOW (ref 39.0–52.0)
HEMOGLOBIN: 9.2 g/dL — AB (ref 13.0–17.0)
Hemoglobin: 8.8 g/dL — ABNORMAL LOW (ref 13.0–17.0)
MCH: 29.4 pg (ref 26.0–34.0)
MCH: 29.1 pg (ref 26.0–34.0)
MCHC: 34 g/dL (ref 30.0–36.0)
MCHC: 33.3 g/dL (ref 30.0–36.0)
MCV: 86.6 fL (ref 78.0–100.0)
MCV: 87.3 fL (ref 78.0–100.0)
PLATELETS: 262 10*3/uL (ref 150–400)
Platelets: 222 10*3/uL (ref 150–400)
RBC: 2.99 MIL/uL — AB (ref 4.22–5.81)
RBC: 3.16 MIL/uL — ABNORMAL LOW (ref 4.22–5.81)
RDW: 14 % (ref 11.5–15.5)
RDW: 14.1 % (ref 11.5–15.5)
WBC: 10.4 10*3/uL (ref 4.0–10.5)
WBC: 11 10*3/uL — AB (ref 4.0–10.5)

## 2016-04-27 MED ORDER — DIPHENHYDRAMINE HCL 25 MG PO CAPS: 25.0000 mg | ORAL_CAPSULE | Freq: Four times a day (QID) | ORAL | Status: DC | PRN

## 2016-04-27 MED ORDER — ACETAMINOPHEN 325 MG PO TABS
650.0000 mg | ORAL_TABLET | ORAL | Status: DC | PRN
Start: 2016-04-27 — End: 2016-04-27

## 2016-04-27 MED ORDER — ACD FORMULA A 0.73-2.45-2.2 GM/100ML VI SOLN
Status: AC
  Filled 2016-04-27: qty 500

## 2016-04-27 MED ORDER — CALCIUM CARBONATE ANTACID 500 MG PO CHEW
2.0000 | CHEWABLE_TABLET | ORAL | Status: AC
  Administered 2016-04-27: 400 mg via ORAL

## 2016-04-27 MED ORDER — HEPARIN SODIUM (PORCINE) 1000 UNIT/ML IJ SOLN: 1000.0000 [IU] | Freq: Once | INTRAMUSCULAR | Status: DC

## 2016-04-27 MED ORDER — ACD FORMULA A 0.73-2.45-2.2 GM/100ML VI SOLN
500.0000 mL | Status: DC
  Administered 2016-04-27: 500 mL via INTRAVENOUS
  Filled 2016-04-27: qty 500

## 2016-04-27 MED ORDER — SODIUM CHLORIDE 0.9 % IV SOLN
INTRAVENOUS | Status: AC
  Administered 2016-04-27 (×5): via INTRAVENOUS_CENTRAL
  Filled 2016-04-27 (×5): qty 200

## 2016-04-27 MED ORDER — PANTOPRAZOLE SODIUM 40 MG PO TBEC
40.0000 mg | DELAYED_RELEASE_TABLET | Freq: Every day | ORAL | Status: DC
  Administered 2016-04-27 – 2016-04-28 (×2): 40 mg via ORAL
  Filled 2016-04-27 (×2): qty 1

## 2016-04-27 MED ORDER — SODIUM CHLORIDE 0.9 % IV SOLN
4.0000 g | Freq: Once | INTRAVENOUS | Status: DC
  Administered 2016-04-27: 2 g via INTRAVENOUS
  Filled 2016-04-27: qty 40

## 2016-04-27 MED ORDER — SODIUM CHLORIDE 0.9 % IV SOLN: Freq: Once | INTRAVENOUS | Status: DC

## 2016-04-27 MED ORDER — CALCIUM CARBONATE ANTACID 500 MG PO CHEW
CHEWABLE_TABLET | ORAL | Status: AC
  Filled 2016-04-27: qty 2

## 2016-04-27 NOTE — Progress Notes (Signed)
Pt places self on/off our cpap machie.  Rt will monitor.

## 2016-04-27 NOTE — Progress Notes (Signed)
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 sodium chloride   Intravenous Once  . aspirin EC  81 mg Oral Daily  . atorvastatin  40 mg Oral q1800  . cyclophosphamide  75 mg Oral Daily  . finasteride  5 mg Oral Daily  . gabapentin  100 mg Oral QHS  . insulin aspart  0-15 Units Subcutaneous TID WC  . insulin aspart  0-5 Units Subcutaneous QHS  . insulin aspart  3 Units Subcutaneous TID WC  . insulin detemir  5 Units Subcutaneous Daily  . levothyroxine  200 mcg Oral QAC breakfast  . pantoprazole  40 mg Oral Daily  . predniSONE  60 mg Oral Q breakfast  . senna  1 tablet Oral BID  . sulfamethoxazole-trimethoprim  1 tablet Oral Q M,W,F-1800   Continuous Infusions: PRN Meds:.acetaminophen **OR** acetaminophen, fentaNYL, midazolam, ondansetron **OR** ondansetron (ZOFRAN) IV, traMADol, zolpidem  Current Labs: reviewed  MPO ANCA + 29.1 Neg: ANA, ASO, GBM, nl C3+C4  Physical Exam:  Blood pressure (!) 119/54, pulse 70, temperature 97.7 F (36.5 C), temperature source Oral, resp. rate 18, height 5\' 4"  (1.626 m), weight 85.4 kg (188 lb 3.2 oz), SpO2 97 %. RRR R IJ TDC C/D/I CTAB S/nt Trace LEE Nonfocal NCAT EOMI  A 1. 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 Minimal Scarring. 2. Completed pulse steroids; Now on Pred 60mg /d 3. PO Cytxoan 4. TPE 1.5PV x7, QOD; Albumin colloid 5. HD as needed 6. DS TMP/SMX TIW PCP prophylaxis 2. HLD 3. Gout  P 1. TPE today 2. Hold on HD today given good UOP and stable labs; daily assessment 3. No other  changes   Pearson Grippe MD 04/27/2016, 9:23 AM   Recent Labs Lab 04/24/16 0217 04/25/16 0511 04/25/16 1843 04/26/16 0735 04/27/16 0450  NA 133* 131* 135 138 134*  K 4.3 3.8 4.1 4.2 4.3  CL 96* 97* 96* 104 100*  CO2 24 22  --  23 23  GLUCOSE 174* 111* 194* 113* 124*  BUN 47* 62* 30* 33* 43*  CREATININE 4.69* 5.47* 3.70* 3.66* 4.40*  CALCIUM 7.2* 7.3*  --  7.8* 7.5*  PHOS 6.1* 7.1*  --  5.2*  --     Recent Labs Lab 04/23/16 0519 04/25/16 0915 04/25/16 1843 04/27/16 0450  WBC 7.5 11.9*  --  10.4  HGB 10.1* 10.0* 9.5* 8.8*  HCT 29.2* 30.1* 28.0* 25.9*  MCV 83.4 84.6  --  86.6  PLT 308 341  --  262

## 2016-04-27 NOTE — Progress Notes (Signed)
[facility] PROGRESS NOTE  BUFORD BEDSOLE [id number] DOB: May 01, 1940 DOA: 04/16/2016  PCP: Kandice Hams, MD  Brief History/Interval Summary: Nicholas Hudson is an 76 y.o. male with medical history significant of PBH, gout, recurrent sinus infection, hypothyroidism. Patient with history of sinus, polyps problems, infection for last 6 to 8 weeks, he has received 2 course of antibiotics by Dr Lucia Gaskins. Patient was found to have worsening renal function. He was hospitalized for further management. Patient seen by nephrology. Renal function continued to get worse. 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 Patient started on dialysis. Also started on plasmapheresis.  Reason for Visit: Acute renal failure  Consultants: Nephrology  Procedures:  Renal biopsy 2/7  Placement of HD catheter 2/9  Echocardiogram Study Conclusions  - Left ventricle: The cavity size was normal. There was moderate   concentric hypertrophy. Systolic function was normal. The   estimated ejection fraction was in the range of 50% to 55%. Wall   motion was normal; there were no regional wall motion   abnormalities. Doppler parameters are consistent with abnormal   left ventricular relaxation (grade 1 diastolic dysfunction). - Aortic valve: Trileaflet; mildly thickened, mildly calcified   leaflets. - Aorta: Ascending aortic diameter: 42 mm (S). - Ascending aorta: The ascending aorta was mildly dilated. - Mitral valve: There was mild regurgitation. - Left atrium: The atrium was mildly dilated.  Hemodialysis  Plasmapheresis starting 2/12  Antibiotics: Initially started on vancomycin and Zosyn. Both of these antibiotics have been discontinued.  Subjective/Interval History: Patient continues to feel well. His significant other is at the bedside. No nausea, vomiting.    ROS: Denies any chest pain or shortness of breath  Objective:  Vital Signs  Vitals:   04/26/16 1023  04/26/16 1641 04/26/16 2230 04/27/16 0611  BP: (!) 155/69 132/65 (!) 145/61 (!) 119/54  Pulse: 72 69 65 70  Resp: 18 18 18 18   Temp: 97.5 F (36.4 C) 98.3 F (36.8 C) 98 F (36.7 C) 97.7 F (36.5 C)  TempSrc: Oral Oral Oral Oral  SpO2: 100% 97% 98% 97%  Weight:   85.4 kg (188 lb 3.2 oz)   Height:   5\' 4"  (1.626 m)     Intake/Output Summary (Last 24 hours) at 04/27/16 0905 Last data filed at 04/27/16 0809  Gross per 24 hour  Intake              840 ml  Output             1675 ml  Net             -835 ml   Filed Weights   04/25/16 1244 04/25/16 2230 04/26/16 2230  Weight: 85 kg (187 lb 6.3 oz) 84.8 kg (187 lb) 85.4 kg (188 lb 3.2 oz)    General appearance: alert, cooperative, appears stated age and no distress Resp: clear to auscultation bilaterally Cardio: regular rate and rhythm, S1, S2 normal, no murmur, click, rub or gallop GI: soft, non-tender; bowel sounds normal; no masses,  no organomegaly Extremities: Does have some edema bilateral lower extremity Neurologic: Awake and alert. No focal deficits.  Lab Results:  Data Reviewed: I have personally reviewed following labs and imaging studies  CBC:  Recent Labs Lab 04/21/16 0535 04/23/16 0519 04/25/16 0915 04/25/16 1843 04/27/16 0450  WBC 5.8 7.5 11.9*  --  10.4  HGB 9.2* 10.1* 10.0* 9.5* 8.8*  HCT 26.7* 29.2* 30.1* 28.0* 25.9*  MCV 82.9 83.4 84.6  --  86.6  PLT 299 308 341  --  [id number]    Basic Metabolic Panel:  Recent Labs Lab 04/22/16 0538 04/23/16 0519 04/24/16 0217 04/25/16 0511 04/25/16 1843 04/26/16 0735 04/27/16 0450  NA 128* 134* 133* 131* 135 138 134*  K 4.0 3.6 4.3 3.8 4.1 4.2 4.3  CL 95* 96* 96* 97* 96* 104 100*  CO2 13* 21* 24 22  --  23 23  GLUCOSE 143* 75 174* 111* 194* 113* 124*  BUN 113* 78* 47* 62* 30* 33* 43*  CREATININE 8.57* 6.44* 4.69* 5.47* 3.70* 3.66* 4.40*  CALCIUM 7.1* 7.0* 7.2* 7.3*  --  7.8* 7.5*  PHOS 12.3* 8.4* 6.1* 7.1*  --  5.2*  --     GFR: Estimated Creatinine  Clearance: 13.9 mL/min (by C-G formula based on SCr of 4.4 mg/dL (H)).  Liver Function Tests:  Recent Labs Lab 04/22/16 0538 04/23/16 0519 04/24/16 0217 04/25/16 0511 04/26/16 0735  ALBUMIN 1.9* 1.7* 1.8* 2.0* 3.6    CBG:  Recent Labs Lab 04/26/16 0755 04/26/16 1148 04/26/16 1636 04/26/16 2236 04/27/16 0758  GLUCAP 105* 216* 151* 133* 100*     Recent Results (from the past 240 hour(s))  MRSA PCR Screening     Status: None   Collection Time: 04/26/16  2:02 PM  Result Value Ref Range Status   MRSA by PCR NEGATIVE NEGATIVE Final    Comment:        The GeneXpert MRSA Assay (FDA approved for NASAL specimens only), is one component of a comprehensive MRSA colonization surveillance program. It is not intended to diagnose MRSA infection nor to guide or monitor treatment for MRSA infections.       Radiology Studies: No results found.   Medications:  Scheduled: . sodium chloride   Intravenous Once  . aspirin EC  81 mg Oral Daily  . atorvastatin  40 mg Oral q1800  . cyclophosphamide  75 mg Oral Daily  . finasteride  5 mg Oral Daily  . gabapentin  100 mg Oral QHS  . insulin aspart  0-15 Units Subcutaneous TID WC  . insulin aspart  0-5 Units Subcutaneous QHS  . insulin aspart  3 Units Subcutaneous TID WC  . insulin detemir  5 Units Subcutaneous Daily  . levothyroxine  200 mcg Oral QAC breakfast  . predniSONE  60 mg Oral Q breakfast  . senna  1 tablet Oral BID  . 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 His creatinine was 1.0 about a year ago. Nephrology was consulted. Autoimmune workup is positive for an elevated myeloperoxidase antibody. 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 Patient transferred over to this facility for further management. Renal biopsy was performed 2/7. 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 Completed pulse steroids; Now on Pred 60mg /day.  Patient started on cyclophosphamide and Bactrim for prophylaxis. Nephrology is following and managing. He was also given high-dose steroids.  Being dialyzed based on renal function, timing as per nephrology.Also, on plasmapheresis now starting 2/12.TPE 1.5PV  x7, QOD  Dark colored stool Patient had one episode of dark stool on 2/12. Hasn't had any further episodes. Hemoglobin is stable. FOBT positive. Will start PPI given high-dose steroids  Sepsis of unclear source Culture data negative till date. Repeat chest x-ray is negative. Patient was initially started on the sepsis pathway. He was given vancomycin and Zosyn. Vancomycin was discontinued due to renal failure. No clear infectious foci. MRI brain did not show any acute abnormalities. MRI abdomen did not show any intra-abdominal process. Zosyn was discontinued as well. Patient could've had a viral syndrome. MRI brain did show paranasal sinus disease and mastoid fluid. However patient has recently completed multiple courses of antibiotics for same. Continue to monitor off of antibiotics. Sepsis physiology, resolved. WBC is normal.  Metabolic encephalopathy Resolved. MRI brain did not show any acute process. Confusion could have been due to acute renal failure.   Elevated alkaline phosphatase Patient noted an elevated alkaline phosphatase. AST, ALT normal. Bilirubin normal. Abdominal  ultrasound showed possible pancreatic mass. MRI did not show any lesion in the pancreas. Finding on the ultrasound was thought to be artifactual from bowel. Outpatient follow-up for elevated alkaline phosphatase.  Elevated blood pressure Patient does not seem to be on any blood pressure lowering agents at home. Blood pressure could be elevated due to effect of steroid. Continue to monitor for now. If remains persistently elevated, may have to initiate antihypertensive agent.  Hyperglycemia No history of diabetes as outpatient. Hyperglycemia is likely due to steroids. Since he is now just on oral steroids, we decreased the dose of his insulin. HbA1c was 6.5. Continue to monitor CBGs and adjust dose of insulin as needed.   Hypothyroidism Continue Synthroid.  Hyponatremia Has improved after dialysis.  DVT Prophylaxis: SCDs  Code Status: Full code  Family Communication: Discussed with patient Disposition Plan: Management as discussed above. Mobilize.    LOS: 11 days   [facility] Pager [phone] 04/27/2016, 9:05 AM  If 7PM-7AM, please contact night-coverage at [email or web address], password [facility]

## 2016-04-27 NOTE — Progress Notes (Signed)
Patient c/o indigestion. MD on call notified. Order received. Will continue to monitor. [name], [name], Therapist, sports

## 2016-04-28 DIAGNOSIS — N179 Acute kidney failure, unspecified: Secondary | ICD-10-CM

## 2016-04-28 LAB — PREPARE FRESH FROZEN PLASMA
UNIT DIVISION: 0
Unit division: 0

## 2016-04-28 LAB — GLUCOSE, CAPILLARY
GLUCOSE-CAPILLARY: 98 mg/dL (ref 65–99)
Glucose-Capillary: 161 mg/dL — ABNORMAL HIGH (ref 65–99)
Glucose-Capillary: 143 mg/dL — ABNORMAL HIGH (ref 65–99)

## 2016-04-28 MED ORDER — CYCLOPHOSPHAMIDE 50 MG PO TABS
75.0000 mg | ORAL_TABLET | ORAL | Status: DC
  Filled 2016-04-28 (×2): qty 1

## 2016-04-28 MED ORDER — CYCLOPHOSPHAMIDE 50 MG PO TABS: 75.0000 mg | ORAL_TABLET | Freq: Every day | ORAL | Status: DC

## 2016-04-28 MED ORDER — CALCIUM CARBONATE ANTACID 500 MG PO CHEW
2.0000 | CHEWABLE_TABLET | Freq: Two times a day (BID) | ORAL | Status: DC | PRN
  Filled 2016-04-28: qty 2

## 2016-04-28 NOTE — Progress Notes (Signed)
Pt stated he would put CPAP on himself when he is ready for bed. RT informed patient to call if he needed assistance. RT will monitor as needed.

## 2016-04-28 NOTE — Progress Notes (Signed)
[facility] PROGRESS NOTE  JURIS SERMERSHEIM [id number] DOB: [birth year] DOA: 04/16/2016  PCP: Kandice Hams, MD  Brief History/Interval Summary: Nicholas Hudson is an 76 y.o. male with medical history significant of PBH, gout, recurrent sinus infection, hypothyroidism. Patient with history of sinus, polyps problems, infection for last 6 to 8 weeks, he has received 2 course of antibiotics by Dr Lucia Gaskins. Patient was found to have worsening renal function. He was hospitalized for further management. Patient seen by nephrology. Renal function continued to get worse. 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 Patient started on dialysis. Also started on plasmapheresis.   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 His creatinine was 1.0 about a year ago. Nephrology was consulted. Autoimmune workup is positive for an elevated myeloperoxidase antibody. 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  Renal biopsy was performed 2/7. 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 Completed pulse steroids; Now on Pred 60mg /day.  Patient started on cyclophosphamide and Bactrim for prophylaxis.  He was also given high-dose steroids.  Being dialyzed based on renal function, urine output  2125 cc, last 24 hours. Slight increase in creatinine 4.4>4.9 .  Marland KitchenAlso, on plasmapheresis now starting 2/12.TPE 1.5PV x7, QOD  Dark colored stool Patient had one episode of dark stool on 2/12. Hasn't had any further episodes. Hemoglobin is stable.  FOBT positive. Will start PPI given high-dose steroids  Sepsis of unclear source Culture data negative till date. Repeat chest x-ray is negative. Patient was initially started on the sepsis pathway. He was given vancomycin and Zosyn. Vancomycin was discontinued due to renal failure. No clear infectious foci. MRI brain did not show any acute abnormalities. MRI abdomen did not show any intra-abdominal process. Zosyn was discontinued as well. Patient could've had a viral syndrome. MRI brain did show paranasal sinus disease and mastoid fluid. However patient has recently completed multiple courses of antibiotics for same. Continue to monitor off of antibiotics. Sepsis physiology, resolved. WBC is normal.  Metabolic encephalopathy Resolved. MRI brain did not show any acute process. Confusion could have been due to acute renal failure.   Elevated alkaline phosphatase Patient noted an elevated alkaline phosphatase. AST, ALT normal. Bilirubin normal. Abdominal ultrasound showed possible pancreatic mass. MRI did not show any lesion in the pancreas. Finding on the ultrasound was thought to be artifactual from bowel. Outpatient follow-up for elevated alkaline phosphatase.  Elevated blood pressure Patient does not seem to be on any blood pressure lowering agents at home. Blood pressure could be elevated due to effect of steroid. Continue to monitor for now. If remains persistently elevated, may have to initiate antihypertensive agent.  Hyperglycemia No history of diabetes as outpatient. Hyperglycemia is likely due to steroids. Since he is now just on oral steroids, we decreased the dose of his insulin. HbA1c was 6.5. Continue to monitor CBGs and adjust dose of insulin as needed.   Hypothyroidism Continue Synthroid.  Hyponatremia Has improved after dialysis.  DVT Prophylaxis: SCDs  Code Status: Full code  Family Communication: Discussed with patient Disposition Plan: Management as per nephrology      Consultants: Nephrology  Procedures:  Renal biopsy 2/7  Placement of HD catheter 2/9  Echocardiogram Study Conclusions  - Left ventricle: The cavity size was normal. There was moderate   concentric hypertrophy. Systolic function was normal. The   estimated ejection fraction was in the range of 50% to 55%. Wall   motion was normal; there were no regional wall motion   abnormalities. Doppler parameters are consistent with abnormal   left ventricular relaxation (grade 1 diastolic dysfunction). - Aortic valve: Trileaflet; mildly thickened, mildly calcified   leaflets. - Aorta: Ascending aortic diameter: 42 mm (S). - Ascending aorta: The ascending aorta was mildly dilated. - Mitral valve: There was mild regurgitation. - Left atrium: The atrium was mildly dilated.  Hemodialysis  Plasmapheresis starting 2/12  Antibiotics: Initially started on vancomycin and Zosyn. Both of these antibiotics have been discontinued.  Subjective/Interval History: Patient continues to feel well. His significant other is at the bedside. No nausea, vomiting.    ROS: Denies any chest pain or shortness of breath  Objective:  Vital Signs  Vitals:   04/27/16 1754 04/27/16 2149 04/28/16 0020 04/28/16 0527  BP: (!) 152/70 (!) 145/68  138/70  Pulse: 65 68  68  Resp: 18 18  17   Temp: 98 F (36.7 C) 98.7 F (37.1 C)  98.3 F (36.8 C)  TempSrc: Oral Oral  Oral  SpO2: 98% 98%  100%  Weight:   84.1 kg (185 lb 6.5 oz)   Height:   5\' 5"  (1.651 m)     Intake/Output Summary (Last 24 hours) at 04/28/16 [id number] Last data filed at 04/28/16 [id number]  Gross per 24 hour  Intake              600 ml  Output             2150 ml  Net            -1550 ml   Filed Weights   04/27/16 1400 04/27/16 1705 04/28/16 0020  Weight: 87.5 kg (192 lb 14.4 oz) 87.9 kg (193 lb 12.6 oz) 84.1 kg (185 lb 6.5 oz)    General appearance: alert, cooperative, appears stated age and no distress Resp: clear to auscultation  bilaterally Cardio: regular rate and rhythm, S1, S2 normal, no murmur, click, rub or gallop GI: soft, non-tender; bowel sounds normal; no masses,  no organomegaly Extremities: Does have some edema bilateral lower extremity Neurologic: Awake and alert. No focal deficits.  Lab Results:  Data Reviewed: I have personally reviewed following labs and imaging studies  CBC:  Recent Labs Lab 04/23/16 0519 04/25/16 0915 04/25/16 1843 04/27/16 0450 04/27/16 1400 04/27/16 1409  WBC 7.5 11.9*  --  10.4 11.0*  --   HGB 10.1* 10.0* 9.5* 8.8* 9.2* 8.8*  HCT 29.2* 30.1* 28.0* 25.9* 27.6* 26.0*  MCV 83.4 84.6  --  86.6 87.3  --   PLT 308 341  --  262 222  --     Basic Metabolic Panel:  Recent Labs Lab 04/22/16 0538 04/23/16 0519 04/24/16 0217 04/25/16 0511 04/25/16 1843 04/26/16 0735 04/27/16 0450 04/27/16 1409  NA 128* 134* 133* 131* 135 138 134* 134*  K 4.0 3.6 4.3 3.8 4.1 4.2 4.3 4.6  CL 95* 96* 96* 97* 96* 104 100* 97*  CO2 13* 21* 24 22  --  23 23  --   GLUCOSE 143* 75 174* 111* 194* 113* 124* 185*  BUN 113* 78* 47* 62* 30* 33* 43* 47*  CREATININE 8.57* 6.44* 4.69* 5.47* 3.70* 3.66* 4.40*  4.90*  CALCIUM 7.1* 7.0* 7.2* 7.3*  --  7.8* 7.5*  --   PHOS 12.3* 8.4* 6.1* 7.1*  --  5.2*  --   --     GFR: Estimated Creatinine Clearance: 12.6 mL/min (by C-G formula based on SCr of 4.9 mg/dL (H)).  Liver Function Tests:  Recent Labs Lab 04/22/16 0538 04/23/16 0519 04/24/16 0217 04/25/16 0511 04/26/16 0735  ALBUMIN 1.9* 1.7* 1.8* 2.0* 3.6    CBG:  Recent Labs Lab 04/27/16 0758 04/27/16 1136 04/27/16 1747 04/27/16 2146 04/28/16 0811  GLUCAP 100* 135* 145* 160* 98     Recent Results (from the past 240 hour(s))  MRSA PCR Screening     Status: None   Collection Time: 04/26/16  2:02 PM  Result Value Ref Range Status   MRSA by PCR NEGATIVE NEGATIVE Final    Comment:        The GeneXpert MRSA Assay (FDA approved for NASAL specimens only), is one component of  a comprehensive MRSA colonization surveillance program. It is not intended to diagnose MRSA infection nor to guide or monitor treatment for MRSA infections.       Radiology Studies: No results found.   Medications:  Scheduled: . sodium chloride   Intravenous Once  . aspirin EC  81 mg Oral Daily  . atorvastatin  40 mg Oral q1800  . cyclophosphamide  75 mg Oral Daily  . finasteride  5 mg Oral Daily  . gabapentin  100 mg Oral QHS  . insulin aspart  0-15 Units Subcutaneous TID WC  . insulin aspart  0-5 Units Subcutaneous QHS  . insulin aspart  3 Units Subcutaneous TID WC  . insulin detemir  5 Units Subcutaneous Daily  . levothyroxine  200 mcg Oral QAC breakfast  . pantoprazole  40 mg Oral Daily  . predniSONE  60 mg Oral Q breakfast  . senna  1 tablet Oral BID  . sulfamethoxazole-trimethoprim  1 tablet Oral Q M,W,F-1800   Continuous:  [id number] **OR** acetaminophen, calcium carbonate, fentaNYL, midazolam, ondansetron **OR** ondansetron (ZOFRAN) IV, traMADol, zolpidem      LOS: 12 days   [facility]  [facility] Pager [phone] 04/28/2016, 9:53 AM  If 7PM-7AM, please contact night-coverage at [email or web address], password [facility]

## 2016-04-28 NOTE — Progress Notes (Signed)
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      02/14 0701 - 02/15 0700 In: 840 [P.O.:840] Out: 2125 [Urine:2125]  Filed Weights   04/27/16 1400 04/27/16 1705 04/28/16 0020  Weight: 87.5 kg (192 lb 14.4 oz) 87.9 kg (193 lb 12.6 oz) 84.1 kg (185 lb 6.5 oz)    Scheduled Meds: . sodium chloride   Intravenous Once  . aspirin EC  81 mg Oral Daily  . atorvastatin  40 mg Oral q1800  . cyclophosphamide  75 mg Oral Daily  . finasteride  5 mg Oral Daily  . gabapentin  100 mg Oral QHS  . insulin aspart  0-15 Units Subcutaneous TID WC  . insulin aspart  0-5 Units Subcutaneous QHS  . insulin aspart  3 Units Subcutaneous TID WC  . insulin detemir  5 Units Subcutaneous Daily  . levothyroxine  200 mcg Oral QAC breakfast  . pantoprazole  40 mg Oral Daily  . predniSONE  60 mg Oral Q breakfast  . senna  1 tablet Oral BID  . sulfamethoxazole-trimethoprim  1 tablet Oral Q M,W,F-1800   Continuous Infusions: PRN Meds:.acetaminophen **OR** acetaminophen, calcium carbonate, fentaNYL, midazolam, ondansetron **OR** ondansetron (ZOFRAN) IV, traMADol, zolpidem  Current Labs: reviewed  MPO ANCA + 29.1 Neg: ANA, ASO, GBM, nl C3+C4  Physical Exam:  Blood pressure 138/70, pulse 68, temperature 98.3 F (36.8 C), temperature source Oral, resp. rate 17, height 5\' 5"  (1.651 m), weight 84.1 kg (185 lb 6.5 oz), SpO2 100 %. RRR R IJ TDC C/D/I CTAB S/nt Trace LEE Nonfocal NCAT EOMI  A 1. 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 Minimal Scarring. 2. Completed pulse steroids; Now on Pred 60mg /d 3. PO Cytxoan 4. TPE 1.5PV x7, QOD; Albumin colloid, started 2/12 5. HD as needed 6. DS TMP/SMX TIW PCP prophylaxis 2. HLD 3. Gout  P 1. TPE today 2. Incrase cytoxan dose to 150mg /d which is  between 1.5 and 2 mg/kg/d 3. Hold on HD today given good UOP and stable labs; daily assessment 4. No other changes   Pearson Grippe MD 04/28/2016, 9:10 AM   Recent Labs Lab 04/24/16 0217 04/25/16 0511  04/26/16 0735 04/27/16 0450 04/27/16 1409  NA 133* 131*  < > 138 134* 134*  K 4.3 3.8  < > 4.2 4.3 4.6  CL 96* 97*  < > 104 100* 97*  CO2 24 22  --  23 23  --   GLUCOSE 174* 111*  < > 113* 124* 185*  BUN 47* 62*  < > 33* 43* 47*  CREATININE 4.69* 5.47*  < > 3.66* 4.40* 4.90*  CALCIUM 7.2* 7.3*  --  7.8* 7.5*  --   PHOS 6.1* 7.1*  --  5.2*  --   --   < > = values in this interval not displayed.  Recent Labs Lab 04/25/16 0915  04/27/16 0450 04/27/16 1400 04/27/16 1409  WBC 11.9*  --  10.4 11.0*  --   HGB 10.0*  < > 8.8* 9.2* 8.8*  HCT 30.1*  < > 25.9* 27.6* 26.0*  MCV 84.6  --  86.6 87.3  --   PLT 341  --  262 222  --   < > = values in this interval not displayed.

## 2016-04-28 NOTE — Progress Notes (Signed)
Patient refused bed alarm this am.Patient advised to call for assistance before getting up. Javonnie Illescas, [name], Therapist, sports

## 2016-04-28 NOTE — Progress Notes (Signed)
Physical Therapy Treatment Patient Details Name: Nicholas Hudson MRN: [id number] DOB: 03/31/1940 Today's Date: 04/28/2016    History of Present Illness 76 y.o. male with medical history significant of PBH, gout, recurrent sinus infection, hypothyroidism. Patient with history of sinus, polyps  problems, infection for last 6 to 8 weeks, he has received 2 course of antibiotics by Dr Lucia Gaskins. Since 6 to 8 weeks ago he has been having headaches, less active, more sleepy/. Since 4 days ago he was  notice to be confuse, and lethargic by family .Mild leukocytosis worsening renal function and mild fever. Pt admitted for acute kidney injury. HD initiated 04-22-16.    PT Comments    Patient progressing well with mobility and gait.  Need to address stairs prior to d/c.  Follow Up Recommendations  No PT follow up;Supervision for mobility/OOB     Equipment Recommendations  None recommended by PT    Recommendations for Other Services       Precautions / Restrictions Precautions Precautions: None Restrictions Weight Bearing Restrictions: No    Mobility  Bed Mobility               General bed mobility comments: On EOB as PT entered room  Transfers Overall transfer level: Modified independent Equipment used: Rolling walker (2 wheeled) Transfers: Sit to/from Stand   Stand pivot transfers: Modified independent (Device/Increase time)          Ambulation/Gait Ambulation/Gait assistance: Supervision Ambulation Distance (Feet): 400 Feet Assistive device: Rolling walker (2 wheeled) Gait Pattern/deviations: Step-through pattern Gait velocity: mild decrease Gait velocity interpretation: Below normal speed for age/gender General Gait Details: Demonstrates safe use of RW.  Good gait pattern.   Stairs            Wheelchair Mobility    Modified Rankin (Stroke Patients Only)       Balance           Standing balance support: No upper extremity supported;During functional  activity Standing balance-Leahy Scale: Fair                      Cognition Arousal/Alertness: Awake/alert Behavior During Therapy: WFL for tasks assessed/performed Overall Cognitive Status: Within Functional Limits for tasks assessed                 General Comments: Very pleasant    Exercises      General Comments        Pertinent Vitals/Pain Pain Assessment: No/denies pain    Home Living                      Prior Function            PT Goals (current goals can now be found in the care plan section) Acute Rehab PT Goals Patient Stated Goal: keep strength, get kidneys working Progress towards PT goals: Progressing toward goals    Frequency    Min 3X/week      PT Plan Current plan remains appropriate    Co-evaluation             End of Session Equipment Utilized During Treatment: Gait belt Activity Tolerance: Patient tolerated treatment well Patient left: in bed;with call bell/phone within reach;with family/visitor present (sitting EOB for lunch)     Time: [id number] PT Time Calculation (min) (ACUTE ONLY): 13 min  Charges:  $Gait Training: 8-22 mins  G Codes:      Despina Pole 04/28/2016, 3:01 PM Carita Pian Sanjuana Kava, University Pager [phone]

## 2016-04-29 ENCOUNTER — Encounter (HOSPITAL_COMMUNITY)
Admission: EM | Disposition: A | Discharge status: Home / Self Care | Payer: Self-pay | Source: Home / Self Care | Attending: Internal Medicine

## 2016-04-29 ENCOUNTER — Encounter (HOSPITAL_COMMUNITY): Payer: Self-pay

## 2016-04-29 HISTORY — PX: ESOPHAGOGASTRODUODENOSCOPY: SHX5428

## 2016-04-29 LAB — COMPREHENSIVE METABOLIC PANEL
ALT: 13 U/L — AB (ref 17–63)
AST: 13 U/L — AB (ref 15–41)
Albumin: 2.8 g/dL — ABNORMAL LOW (ref 3.5–5.0)
Alkaline Phosphatase: 47 U/L (ref 38–126)
Anion gap: 9 (ref 5–15)
BUN: 71 mg/dL — ABNORMAL HIGH (ref 6–20)
CHLORIDE: 110 mmol/L (ref 101–111)
CO2: 19 mmol/L — AB (ref 22–32)
CREATININE: 4.88 mg/dL — AB (ref 0.61–1.24)
Calcium: 7.5 mg/dL — ABNORMAL LOW (ref 8.9–10.3)
GFR calc Af Amer: 12 mL/min — ABNORMAL LOW (ref >60)
GFR calc non Af Amer: 10 mL/min — ABNORMAL LOW (ref >60)
Glucose, Bld: 112 mg/dL — ABNORMAL HIGH (ref 65–99)
POTASSIUM: 4.5 mmol/L (ref 3.5–5.1)
SODIUM: 138 mmol/L (ref 135–145)
Total Bilirubin: 1 mg/dL (ref 0.3–1.2)
Total Protein: 3.9 g/dL — ABNORMAL LOW (ref 6.5–8.1)

## 2016-04-29 LAB — CBC
HCT: 19.4 % — ABNORMAL LOW (ref 39.0–52.0)
HCT: 21.1 % — ABNORMAL LOW (ref 39.0–52.0)
HEMATOCRIT: 19.1 % — AB (ref 39.0–52.0)
HEMOGLOBIN: 7.1 g/dL — AB (ref 13.0–17.0)
Hemoglobin: 6.5 g/dL — CL (ref 13.0–17.0)
Hemoglobin: 6.4 g/dL — CL (ref 13.0–17.0)
MCH: 28.9 pg (ref 26.0–34.0)
MCH: 28.4 pg (ref 26.0–34.0)
MCH: 28.2 pg (ref 26.0–34.0)
MCHC: 33.5 g/dL (ref 30.0–36.0)
MCHC: 33.5 g/dL (ref 30.0–36.0)
MCHC: 33.6 g/dL (ref 30.0–36.0)
MCV: 86.2 fL (ref 78.0–100.0)
MCV: 84.9 fL (ref 78.0–100.0)
MCV: 83.7 fL (ref 78.0–100.0)
PLATELETS: 213 10*3/uL (ref 150–400)
PLATELETS: 186 10*3/uL (ref 150–400)
Platelets: 184 10*3/uL (ref 150–400)
RBC: 2.25 MIL/uL — AB (ref 4.22–5.81)
RBC: 2.25 MIL/uL — ABNORMAL LOW (ref 4.22–5.81)
RBC: 2.52 MIL/uL — AB (ref 4.22–5.81)
RDW: 14.4 % (ref 11.5–15.5)
RDW: 16 % — AB (ref 11.5–15.5)
RDW: 15.9 % — ABNORMAL HIGH (ref 11.5–15.5)
WBC: 10 10*3/uL (ref 4.0–10.5)
WBC: 10.3 10*3/uL (ref 4.0–10.5)
WBC: 9.2 10*3/uL (ref 4.0–10.5)

## 2016-04-29 LAB — GLUCOSE, CAPILLARY
GLUCOSE-CAPILLARY: 138 mg/dL — AB (ref 65–99)
GLUCOSE-CAPILLARY: 184 mg/dL — AB (ref 65–99)
GLUCOSE-CAPILLARY: 205 mg/dL — AB (ref 65–99)
Glucose-Capillary: 218 mg/dL — ABNORMAL HIGH (ref 65–99)
Glucose-Capillary: 131 mg/dL — ABNORMAL HIGH (ref 65–99)

## 2016-04-29 LAB — MRSA PCR SCREENING: MRSA by PCR: NEGATIVE

## 2016-04-29 LAB — PREPARE RBC (CROSSMATCH)

## 2016-04-29 SURGERY — EGD (ESOPHAGOGASTRODUODENOSCOPY)
Anesthesia: Moderate Sedation

## 2016-04-29 MED ORDER — FENTANYL CITRATE (PF) 100 MCG/2ML IJ SOLN
INTRAMUSCULAR | Status: DC | PRN
  Administered 2016-04-29 (×2): 12.5 ug via INTRAVENOUS

## 2016-04-29 MED ORDER — EPINEPHRINE PF 1 MG/10ML IJ SOSY
PREFILLED_SYRINGE | INTRAMUSCULAR | Status: AC
  Filled 2016-04-29: qty 10

## 2016-04-29 MED ORDER — PANTOPRAZOLE SODIUM 40 MG IV SOLR: 40.0000 mg | Freq: Two times a day (BID) | INTRAVENOUS | Status: DC

## 2016-04-29 MED ORDER — PANTOPRAZOLE SODIUM 40 MG IV SOLR
40.0000 mg | Freq: Two times a day (BID) | INTRAVENOUS | Status: DC
  Administered 2016-04-29: 40 mg via INTRAVENOUS
  Filled 2016-04-29: qty 40

## 2016-04-29 MED ORDER — DIPHENHYDRAMINE HCL 50 MG/ML IJ SOLN
INTRAMUSCULAR | Status: AC
  Filled 2016-04-29: qty 1

## 2016-04-29 MED ORDER — MIDAZOLAM HCL 10 MG/2ML IJ SOLN
INTRAMUSCULAR | Status: DC | PRN
  Administered 2016-04-29 (×3): 1 mg via INTRAVENOUS

## 2016-04-29 MED ORDER — FENTANYL CITRATE (PF) 100 MCG/2ML IJ SOLN
INTRAMUSCULAR | Status: AC
  Filled 2016-04-29: qty 2

## 2016-04-29 MED ORDER — METHYLPREDNISOLONE SODIUM SUCC 125 MG IJ SOLR
60.0000 mg | INTRAMUSCULAR | Status: DC
  Administered 2016-04-29 – 2016-05-01 (×3): 60 mg via INTRAVENOUS
  Filled 2016-04-29 (×2): qty 0.96
  Filled 2016-04-29: qty 2
  Filled 2016-04-29: qty 0.96

## 2016-04-29 MED ORDER — SODIUM CHLORIDE 0.9 % IV SOLN
8.0000 mg/h | INTRAVENOUS | Status: AC
  Administered 2016-04-29 – 2016-05-01 (×6): 8 mg/h via INTRAVENOUS
  Filled 2016-04-29 (×13): qty 80

## 2016-04-29 MED ORDER — SODIUM CHLORIDE 0.9 % IV SOLN
Freq: Once | INTRAVENOUS | Status: AC
  Administered 2016-04-29: 08:00:00 via INTRAVENOUS

## 2016-04-29 MED ORDER — EPINEPHRINE PF 1 MG/10ML IJ SOSY
PREFILLED_SYRINGE | INTRAMUSCULAR | Status: DC | PRN
  Administered 2016-04-29: 2 mL

## 2016-04-29 MED ORDER — MIDAZOLAM HCL 5 MG/ML IJ SOLN
INTRAMUSCULAR | Status: AC
  Filled 2016-04-29: qty 2

## 2016-04-29 MED ORDER — SODIUM CHLORIDE 0.9 % IV SOLN
80.0000 mg | Freq: Once | INTRAVENOUS | Status: AC
  Administered 2016-04-29: 16:00:00 80 mg via INTRAVENOUS
  Filled 2016-04-29: qty 80

## 2016-04-29 MED ORDER — BUTAMBEN-TETRACAINE-BENZOCAINE 2-2-14 % EX AERO
INHALATION_SPRAY | CUTANEOUS | Status: DC | PRN
  Administered 2016-04-29: 2 via TOPICAL

## 2016-04-29 MED ORDER — ONDANSETRON HCL 4 MG/2ML IJ SOLN
INTRAMUSCULAR | Status: AC
  Filled 2016-04-29: qty 2

## 2016-04-29 NOTE — Progress Notes (Signed)
Received call from lab,patient's hemoglobin is 6.5 Patient also had a bloody bowel movement this am. Dr. Allyson Sabal notified. Nicholas Hudson, [name], Therapist, sports

## 2016-04-29 NOTE — Progress Notes (Addendum)
[facility] PROGRESS NOTE  Nicholas Hudson [id number] DOB: 1940/03/23 DOA: 04/16/2016  PCP: Kandice Hams, MD  Brief History/Interval Summary: Nicholas Hudson is an 76 y.o. male with medical history significant of PBH, gout, recurrent sinus infection, hypothyroidism. Patient with history of sinus, polyps problems, infection for last 6 to 8 weeks, he has received 2 course of antibiotics by Dr Lucia Gaskins. Patient was found to have worsening renal function. He was hospitalized for further management. Patient seen by nephrology. Renal function continued to get worse. 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 Patient started on dialysis. Also started on plasmapheresis.   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 Joelyn Oms about transfusing 2 units [facility] gastroenterology for further evaluation EGD                 - Normal esophagus.                           - A small amount of food (residue) in the stomach.                           - One non-obstructing oozing duodenal ulcer with a                            visible vessel. Treatment not successful. Treated                            with bipolar cautery. Injected. Clip was placed.                             Recommendation:  Return patient to ICU for ongoing care.                           - NPO.                           - Continue present medications.  changed protonix to IV infusion Dr Oletta Lamas recommended general surgery consult , for evaluation of bleeding  duodenal ulcer     Myeloperoxidase antibody positive ANCA glomerulonephritis/AKI Patient's acute kidney injury appears to have developed over the last 1-2 months. His creatinine was 1.0 about a year ago.  Autoimmune workup is positive for an elevated myeloperoxidase antibody. 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  Renal biopsy was performed 2/7. 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 Completed pulse steroids; Now on Pred 60mg /day.  Patient started on cyclophosphamide and Bactrim for prophylaxis.  He was also given high-dose steroids.  Being dialyzed based on renal function, urine output  2125 cc, last 24 hours. Slight increase in creatinine 4.4>4.9>4.88 .  Marland KitchenAlso, on plasmapheresis now starting 2/12.TPE 1.5PV x7, QOD     Sepsis of unclear source Culture  data negative till date. Repeat chest x-ray is negative. Patient was initially started on the sepsis pathway. He was given vancomycin and Zosyn. Vancomycin was discontinued due to renal failure. No clear infectious foci. MRI brain did not show any acute abnormalities. MRI abdomen did not show any intra-abdominal process. Zosyn was discontinued as well. Patient could've had a viral syndrome. MRI brain did show paranasal sinus disease and mastoid fluid. However patient has recently completed multiple courses of antibiotics for same. Continue to monitor off of antibiotics. Sepsis physiology, resolved. WBC is normal.  Metabolic encephalopathy Resolved. MRI brain did not show any acute process. Confusion could have been due to acute renal failure.   Elevated alkaline phosphatase Patient noted an elevated alkaline phosphatase. AST, ALT normal. Bilirubin normal. Abdominal ultrasound showed possible pancreatic mass. MRI did not show any lesion in the pancreas. Finding on the ultrasound was thought to be artifactual from bowel. Outpatient follow-up for elevated alkaline  phosphatase.  Elevated blood pressure Patient does not seem to be on any blood pressure lowering agents at home. Blood pressure could be elevated due to effect of steroid. Continue to monitor for now. If remains persistently elevated, may have to initiate antihypertensive agent.  Hyperglycemia No history of diabetes as outpatient. Hyperglycemia is likely due to steroids. Since he is now just on oral steroids, we decreased the dose of his insulin. HbA1c was 6.5. Continue to monitor CBGs and adjust dose of insulin as needed.   Hypothyroidism Continue Synthroid.  Hyponatremia Has improved after dialysis.  DVT Prophylaxis: SCDs  Code Status: Full code  Family Communication: Discussed with patient Disposition Plan: tx to ICU for mx of duodenal ulcer     Consultants: Nephrology, gastroenterology  Procedures:  Renal biopsy 2/7  Placement of HD catheter 2/9  Echocardiogram Study Conclusions  - Left ventricle: The cavity size was normal. There was moderate   concentric hypertrophy. Systolic function was normal. The   estimated ejection fraction was in the range of 50% to 55%. Wall   motion was normal; there were no regional wall motion   abnormalities. Doppler parameters are consistent with abnormal   left ventricular relaxation (grade 1 diastolic dysfunction). - Aortic valve: Trileaflet; mildly thickened, mildly calcified   leaflets. - Aorta: Ascending aortic diameter: 42 mm (S). - Ascending aorta: The ascending aorta was mildly dilated. - Mitral valve: There was mild regurgitation. - Left atrium: The atrium was mildly dilated.  Hemodialysis  Plasmapheresis starting 2/12  Antibiotics: Initially started on vancomycin and Zosyn. Both of these antibiotics have been discontinued.  Subjective/Interval History: 2 episodes of melena today  ROS: Denies any chest pain or shortness of breath  Objective:  Vital Signs  Vitals:   04/28/16 1039 04/28/16 1816 04/28/16  2131 04/29/16 0638  BP: (!) 141/64 (!) 162/91 (!) 155/73 (!) 110/91  Pulse: 74 80 71 (!) 120  Resp: 16 16 18 16   Temp: 98 F (36.7 C) 98.1 F (36.7 C) 98.1 F (36.7 C) 98.2 F (36.8 C)  TempSrc: Oral Oral Oral Oral  SpO2: 98% 100% 98% 93%  Weight:   83.1 kg (183 lb 3.2 oz)   Height:        Intake/Output Summary (Last 24 hours) at 04/29/16 0750 Last data filed at 04/29/16 [id number]  Gross per 24 hour  Intake              600 ml  Output             3270 ml  Net            -  2670 ml   Filed Weights   04/27/16 1705 04/28/16 0020 04/28/16 2131  Weight: 87.9 kg (193 lb 12.6 oz) 84.1 kg (185 lb 6.5 oz) 83.1 kg (183 lb 3.2 oz)    General appearance: alert, cooperative, appears stated age and no distress Resp: clear to auscultation bilaterally Cardio: regular rate and rhythm, S1, S2 normal, no murmur, click, rub or gallop GI: soft, non-tender; bowel sounds normal; no masses,  no organomegaly Extremities: Does have some edema bilateral lower extremity Neurologic: Awake and alert. No focal deficits.  Lab Results:  Data Reviewed: I have personally reviewed following labs and imaging studies  CBC:  Recent Labs Lab 04/23/16 0519 04/25/16 0915 04/25/16 1843 04/27/16 0450 04/27/16 1400 04/27/16 1409 04/29/16 0629  WBC 7.5 11.9*  --  10.4 11.0*  --  10.0  HGB 10.1* 10.0* 9.5* 8.8* 9.2* 8.8* 6.5*  HCT 29.2* 30.1* 28.0* 25.9* 27.6* 26.0* 19.4*  MCV 83.4 84.6  --  86.6 87.3  --  86.2  PLT 308 341  --  262 222  --  123456    Basic Metabolic Panel:  Recent Labs Lab 04/23/16 0519 04/24/16 0217 04/25/16 0511 04/25/16 1843 04/26/16 0735 04/27/16 0450 04/27/16 1409 04/29/16 0629  NA 134* 133* 131* 135 138 134* 134* 138  K 3.6 4.3 3.8 4.1 4.2 4.3 4.6 4.5  CL 96* 96* 97* 96* 104 100* 97* 110  CO2 21* 24 22  --  23 23  --  19*  GLUCOSE 75 174* 111* 194* 113* 124* 185* 112*  BUN 78* 47* 62* 30* 33* 43* 47* 71*  CREATININE 6.44* 4.69* 5.47* 3.70* 3.66* 4.40* 4.90* 4.88*  CALCIUM  7.0* 7.2* 7.3*  --  7.8* 7.5*  --  7.5*  PHOS 8.4* 6.1* 7.1*  --  5.2*  --   --   --     GFR: Estimated Creatinine Clearance: 12.6 mL/min (by C-G formula based on SCr of 4.88 mg/dL (H)).  Liver Function Tests:  Recent Labs Lab 04/23/16 0519 04/24/16 0217 04/25/16 0511 04/26/16 0735 04/29/16 0629  AST  --   --   --   --  13*  ALT  --   --   --   --  13*  ALKPHOS  --   --   --   --  47  BILITOT  --   --   --   --  1.0  PROT  --   --   --   --  3.9*  ALBUMIN 1.7* 1.8* 2.0* 3.6 2.8*    CBG:  Recent Labs Lab 04/27/16 1747 04/27/16 2146 04/28/16 0811 04/28/16 1133 04/28/16 2129  GLUCAP 145* 160* 98 161* 143*     Recent Results (from the past 240 hour(s))  MRSA PCR Screening     Status: None   Collection Time: 04/26/16  2:02 PM  Result Value Ref Range Status   MRSA by PCR NEGATIVE NEGATIVE Final    Comment:        The GeneXpert MRSA Assay (FDA approved for NASAL specimens only), is one component of a comprehensive MRSA colonization surveillance program. It is not intended to diagnose MRSA infection nor to guide or monitor treatment for MRSA infections.       Radiology Studies: No results found.   Medications:  Scheduled: . sodium chloride   Intravenous Once  . sodium chloride   Intravenous Once  . aspirin EC  81 mg Oral Daily  . atorvastatin  40 mg Oral q1800  .  cyclophosphamide  75 mg Oral Q24H  . finasteride  5 mg Oral Daily  . gabapentin  100 mg Oral QHS  . insulin aspart  0-15 Units Subcutaneous TID WC  . insulin aspart  0-5 Units Subcutaneous QHS  . insulin aspart  3 Units Subcutaneous TID WC  . insulin detemir  5 Units Subcutaneous Daily  . levothyroxine  200 mcg Oral QAC breakfast  . pantoprazole (PROTONIX) IV  40 mg Intravenous Q12H  . predniSONE  60 mg Oral Q breakfast  . senna  1 tablet Oral BID  . sulfamethoxazole-trimethoprim  1 tablet Oral Q M,W,F-1800   Continuous:  [id number] **OR** acetaminophen, calcium carbonate,  fentaNYL, midazolam, ondansetron **OR** ondansetron (ZOFRAN) IV, traMADol, zolpidem      LOS: 13 days   [facility]  [facility] Pager [phone] 04/29/2016, 7:50 AM  If 7PM-7AM, please contact night-coverage at [email or web address], password [facility]

## 2016-04-29 NOTE — Progress Notes (Signed)
Blood pressure paused for about 20 min while looking for a second IV; which was difficult, 3 attempts.  BP was 142/53 prior to being transported to the ICU.

## 2016-04-29 NOTE — Progress Notes (Signed)
Patient instructed to call for any assistance before getting up as his hemoglobin is low. Patient did verbalized of feeling weak. Alayna Mabe, [name], Therapist, sports

## 2016-04-29 NOTE — Op Note (Signed)
[facility] Patient Name: Nicholas Hudson Procedure Date : 04/29/2016 MRN: [id number] Attending MD: Nancy Fetter Dr., MD Date of Birth: 1941/01/09 CSN: [id number] Age: 76 Admit Type: Inpatient Procedure:                Upper GI endoscopy with control of bleeding Indications:              Hematochezia, Active gastrointestinal bleeding Providers:                Jeneen Rinks L. Miko Sirico Dr., MD, Vista Lawman, RN, Dortha Schwalbe RN, RN, William Dalton, Technician Referring MD:              Medicines:                Fentanyl 25 micrograms IV, Midazolam 3 mg IV,                            Cetacaine spray Complications:            No immediate complications. Estimated Blood Loss:     Estimated blood loss was minimal. Procedure:                Pre-Anesthesia Assessment:                           - Prior to the procedure, a History and Physical                            was performed, and patient medications and                            allergies were reviewed. The patient's tolerance of                            previous anesthesia was also reviewed. The risks                            and benefits of the procedure and the sedation                            options and risks were discussed with the patient.                            All questions were answered, and informed consent                            was obtained. Prior Anticoagulants: The patient has                            taken no previous anticoagulant or antiplatelet                            agents. ASA Grade Assessment: IV - A patient with  severe systemic disease that is a constant threat                            to life. After reviewing the risks and benefits,                            the patient was deemed in satisfactory condition to                            undergo the procedure.                           After obtaining informed consent, the  endoscope was                            passed under direct vision. Throughout the                            procedure, the patient's blood pressure, pulse, and                            oxygen saturations were monitored continuously. The                            [id number]) scope was introduced through the                            mouth, and advanced to the second part of duodenum.                            The upper GI endoscopy was accomplished without                            difficulty. The patient tolerated the procedure                            well. Scope In: Scope Out: Findings:      The esophagus was normal.      A small amount of food (residue) was found on the greater curvature of       the stomach.      One non-obstructing oozing cratered duodenal ulcer with a visible vessel       was found in the duodenal bulb. The lesion was 20 mm in largest       dimension. Coagulation for hemostasis using bipolar probe was       unsuccessful. There was decrease in the bleeding but did not stop       completely. Area was successfully injected with 2 mL of a 1:10,000       solution of epinephrine for hemostasis. To prevent bleeding       post-intervention, one hemostatic clip was successfully placed. There       was no bleeding at the end of the procedure. Impression:               - Normal esophagus.                           -  A small amount of food (residue) in the stomach.                           - One non-obstructing oozing duodenal ulcer with a                            visible vessel. Treatment not successful. Treated                            with bipolar cautery. Injected. Clip was placed.                            Minimal bleeding at termination of procedure.                           - No specimens collected. Moderate Sedation:      Moderate (conscious) sedation was administered by the endoscopy nurse       and supervised by the endoscopist. The  following parameters were       monitored: oxygen saturation, heart rate, blood pressure, respiratory       rate, EKG, adequacy of pulmonary ventilation, and response to care. Recommendation:           - Return patient to ICU for ongoing care.                           - NPO.                           - Continue present medications. we will change the                            Protonix to IV infusion Procedure Code(s):        --- Professional ---                           [phone], Esophagogastroduodenoscopy, flexible,                            transoral; with control of bleeding, any method Diagnosis Code(s):        --- Professional ---                           K26.4, Chronic or unspecified duodenal ulcer with                            hemorrhage                           K92.1, Melena (includes Hematochezia)                           K92.2, Gastrointestinal hemorrhage, unspecified CPT copyright 2016 [employer] Medical Association. All rights reserved. The codes documented in this report are preliminary and upon coder review may  be revised to meet current compliance requirements. Nancy Fetter Dr., MD 04/29/2016 3:35:19 PM This report has been signed electronically. Number  of Addenda: 0

## 2016-04-29 NOTE — Progress Notes (Signed)
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    02/15 0701 - 02/16 0700 In: 600 [P.O.:600] Out: 3270 [Urine:3270]  Filed Weights   04/27/16 1705 04/28/16 0020 04/28/16 2131  Weight: 87.9 kg (193 lb 12.6 oz) 84.1 kg (185 lb 6.5 oz) 83.1 kg (183 lb 3.2 oz)    Scheduled Meds: . sodium chloride   Intravenous Once  . sodium chloride   Intravenous Once  . aspirin EC  81 mg Oral Daily  . atorvastatin  40 mg Oral q1800  . cyclophosphamide  75 mg Oral Q24H  . finasteride  5 mg Oral Daily  . gabapentin  100 mg Oral QHS  . insulin aspart  0-15 Units Subcutaneous TID WC  . insulin aspart  0-5 Units Subcutaneous QHS  . insulin aspart  3 Units Subcutaneous TID WC  . insulin detemir  5 Units Subcutaneous Daily  . levothyroxine  200 mcg Oral QAC breakfast  . pantoprazole (PROTONIX) IV  40 mg Intravenous Q12H  . predniSONE  60 mg Oral Q breakfast  . senna  1 tablet Oral BID  . 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 MPO ANCA + 29.1 Neg: ANA, ASO, GBM, nl C3+C4  Physical Exam:  Blood pressure 119/60, pulse 96, temperature 97.9 F (36.6 C), temperature source Oral, resp. rate 16, height 5\' 5"  (1.651 m), weight 83.1 kg (183 lb 3.2 oz), SpO2 100 %. RRR R IJ TDC C/D/I CTAB S/nt, +BS Trace LEE Nonfocal NCAT EOMI  A 1. 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 Minimal Scarring. 2. Completed pulse steroids; Now on Pred 60mg /d 3. PO  Cytxoan 4. TPE 1.5PV x7, QOD; Albumin colloid, started 2/12 5. HD as needed 6. DS TMP/SMX TIW PCP prophylaxis 2. HLD 3. Gout 4. UGIB 04/29/15 5. 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 GI eval; transfusion ordered already 2. Can rec pRBC without dialysis given robust UOP.   3. Seems might have engouth GFR to stay off HD; Hold on HD today given good UOP and stable labs; daily assessment 4. No other changes 5. Will need FFP with upcoming TPE given GIB   Pearson Grippe MD 04/29/2016, 8:56 AM   Recent Labs Lab 04/24/16 0217 04/25/16 0511  04/26/16 0735 04/27/16 0450 04/27/16 1409 04/29/16 0629  NA 133* 131*  < > 138 134* 134* 138  K 4.3 3.8  < > 4.2 4.3 4.6 4.5  CL 96* 97*  < > 104 100* 97* 110  CO2 24 22  --  23 23  --  19*  GLUCOSE 174* 111*  < > 113* 124* 185* 112*  BUN 47* 62*  < > 33* 43* 47* 71*  CREATININE 4.69* 5.47*  < > 3.66* 4.40* 4.90* 4.88*  CALCIUM 7.2* 7.3*  --  7.8* 7.5*  --  7.5*  PHOS 6.1* 7.1*  --  5.2*  --   --   --   < > = values in this interval not displayed.  Recent  Labs Lab 04/27/16 0450 04/27/16 1400 04/27/16 1409 04/29/16 0629  WBC 10.4 11.0*  --  10.0  HGB 8.8* 9.2* 8.8* 6.5*  HCT 25.9* 27.6* 26.0* 19.4*  MCV 86.6 87.3  --  86.2  PLT 262 222  --  213

## 2016-04-29 NOTE — Progress Notes (Signed)
Patient was to have tapwater enemas in case upper G.I. was negative we were going to do sigmoidoscopy. For unclear reasons these enemas were not given. EGD showed actively bleeding duodenal ulcer so no sigmoidoscopy was needed fortunately.

## 2016-04-29 NOTE — Progress Notes (Signed)
[place] Progress Note Patient Name: Nicholas Hudson DOB: December 01, 1940 MRN: [id number]   Date of Service  04/29/2016  HPI/Events of Note  Patient  has been admitted for generalized weakness and confusion on February 3.   He eventually was diagnosed with anemia. Hemoglobin dropped to 6 today. GI service did endoscopy which showed bleeding duodenal ulcer which was cauterized and clipped. He was transferred to the ICU post endoscopy.   Patient seen, comfortable, watching TV, not in distress, no subjective complaints. No chest pain, dyspnea, nausea, vomiting, abdominal pain.   He just finished his second unit of packed red blood cells. Repeat CBC is pending.   Blood pressure 130 /68, heart rate 70, respiratory rate 15, sats 99% on nasal cannula.   A > UGIBleed 2/2 Bleeding DU        CKD, making urine    eICU Interventions  Keep nothing by mouth.  Continue protonix strip.  Continue with H&H q6 hrs.  Keep Hb > 7 Keep as a SDU pt for now.  TRH will be primary.  [facility] will follow pt overnight.  If pt clinically worsens and if he will need ICU care, we will transfer pt to ICU.         Intervention Category Evaluation Type: New Patient Evaluation  Rush Landmark 04/29/2016, 10:33 PM

## 2016-04-29 NOTE — Consult Note (Signed)
[facility] CONSULT Reason for consult: G.I. bleeding Referring Physician: [facility] hospitalist. PCP: Dr. Delfina Redwood. Primary G.I.: Dr. [name] is an 76 y.o. male.  HPI: he has a history of gout hypothyroidism sinus infections etc. He had been treated with antibiotics is an outpatient for recurrent sinus infections and began to feel particularly bad. He was also noted to be quite confused. He presented with AKI with a creatinine up to 4 when previously been normal. He has been followed by the nephrology and renal biopsy was obtained. 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 He has been treated with cytotoxin, Neurontin, insulin and prednisone. He has had intermittent dialysis as well as plasmapheresis. He has been eating reasonably well but notes that stools have been dark for the past several days. He denies any NSAID use and denies any dyspepsia. Today he passed 2 large bloody bowel movements. They tended to be bright red and maroon. He feels quite weak.   is hemoglobin is drop from 9.2 to 6.5. Blood transfusion has been ordered. He has been started on IV Protonix. His creatinine remains around 5 and is BUN is elevated. The patient has not had ulcers. In 2011 he had a normal screening colonoscopy by Dr. Wynetta Emery.  Past Medical History:  Diagnosis Date  . BPH (benign prostatic hyperplasia)   . Gout   . Hyperlipidemia   . Renal disorder   . Thyroid disease     Past Surgical History:  Procedure Laterality Date  . IR GENERIC HISTORICAL  04/22/2016   IR US GUIDE VASC ACCESS RIGHT 04/22/2016 Sandi Mariscal, MD [facility]  . IR GENERIC HISTORICAL  04/22/2016   IR FLUORO GUIDE CV LINE RIGHT 04/22/2016 Sandi Mariscal, MD [facility]    History reviewed. No pertinent family history.  Social History:  reports that he has never smoked. He has never used smokeless tobacco. He reports that he does not drink alcohol. His  drug history is not on file.  Allergies: No Known Allergies  Medications; Prior to Admission medications   Medication Sig Start Date End Date Taking? Authorizing Provider  allopurinol (ZYLOPRIM) 100 MG tablet Take 1 tablet (100 mg total) by mouth daily. 05/28/14  Yes Wallene Huh, DPM  aspirin EC 81 MG tablet Take 81 mg by mouth daily.   Yes Historical Provider, MD  cholecalciferol (VITAMIN D) 1000 UNITS tablet Take 2,000 Units by mouth daily.   Yes Historical Provider, MD  cyanocobalamin 2000 MCG tablet Take 2,000 mcg by mouth daily.   Yes Historical Provider, MD  finasteride (PROSCAR) 5 MG tablet Take 5 mg by mouth daily.   Yes Historical Provider, MD  gabapentin (NEURONTIN) 300 MG capsule Take 300 mg by mouth at bedtime. 04/14/16  Yes Historical Provider, MD  levothyroxine (SYNTHROID, LEVOTHROID) 200 MCG tablet Take 200 mcg by mouth daily before breakfast.  04/16/16  Yes Historical Provider, MD  Omega-3 Fatty Acids (FISH OIL) 1000 MG CAPS Take 1,000 mg by mouth daily.    Yes Historical Provider, MD  rosuvastatin (CRESTOR) 20 MG tablet Take 20 mg by mouth daily.   Yes Historical Provider, MD   . sodium chloride   Intravenous Once  . sodium chloride   Intravenous Once  . aspirin EC  81 mg Oral Daily  . atorvastatin  40 mg Oral q1800  . cyclophosphamide  75 mg Oral Q24H  . finasteride  5 mg Oral Daily  .  gabapentin  100 mg Oral QHS  . insulin aspart  0-15 Units Subcutaneous TID WC  . insulin aspart  0-5 Units Subcutaneous QHS  . insulin aspart  3 Units Subcutaneous TID WC  . insulin detemir  5 Units Subcutaneous Daily  . levothyroxine  200 mcg Oral QAC breakfast  . pantoprazole (PROTONIX) IV  40 mg Intravenous Q12H  . predniSONE  60 mg Oral Q breakfast  . senna  1 tablet Oral BID  . sulfamethoxazole-trimethoprim  1 tablet Oral Q M,W,F-1800   PRN Meds acetaminophen **OR** acetaminophen, calcium carbonate, fentaNYL, midazolam, ondansetron **OR** ondansetron (ZOFRAN) IV, traMADol,  zolpidem Results for orders placed or performed during the hospital encounter of 04/16/16 (from the past 48 hour(s))  Glucose, capillary     Status: Abnormal   Collection Time: 04/27/16 11:36 AM  Result Value Ref Range   Glucose-Capillary 135 (H) 65 - 99 mg/dL  CBC     Status: Abnormal   Collection Time: 04/27/16  2:00 PM  Result Value Ref Range   WBC 11.0 (H) 4.0 - 10.5 K/uL   RBC 3.16 (L) 4.22 - 5.81 MIL/uL   Hemoglobin 9.2 (L) 13.0 - 17.0 g/dL   HCT 27.6 (L) 39.0 - 52.0 %   MCV 87.3 78.0 - 100.0 fL   MCH 29.1 26.0 - 34.0 pg   MCHC 33.3 30.0 - 36.0 g/dL   RDW 14.1 11.5 - 15.5 %   Platelets 222 150 - 400 K/uL  Reticulocytes     Status: Abnormal   Collection Time: 04/27/16  2:00 PM  Result Value Ref Range   Retic Ct Pct 1.4 0.4 - 3.1 %   RBC. 3.16 (L) 4.22 - 5.81 MIL/uL   Retic Count, Manual 44.2 19.0 - 186.0 K/uL  I-STAT, chem 8     Status: Abnormal   Collection Time: 04/27/16  2:09 PM  Result Value Ref Range   Sodium 134 (L) 135 - 145 mmol/L   Potassium 4.6 3.5 - 5.1 mmol/L   Chloride 97 (L) 101 - 111 mmol/L   BUN 47 (H) 6 - 20 mg/dL   Creatinine, Ser 4.90 (H) 0.61 - 1.24 mg/dL   Glucose, Bld 185 (H) 65 - 99 mg/dL   Calcium, Ion 1.09 (L) 1.15 - 1.40 mmol/L   TCO2 22 0 - 100 mmol/L   Hemoglobin 8.8 (L) 13.0 - 17.0 g/dL   HCT 26.0 (L) 39.0 - 52.0 %  Glucose, capillary     Status: Abnormal   Collection Time: 04/27/16  5:47 PM  Result Value Ref Range   Glucose-Capillary 145 (H) 65 - 99 mg/dL   Comment 1 Notify RN   Glucose, capillary     Status: Abnormal   Collection Time: 04/27/16  9:46 PM  Result Value Ref Range   Glucose-Capillary 160 (H) 65 - 99 mg/dL  Glucose, capillary     Status: None   Collection Time: 04/28/16  8:11 AM  Result Value Ref Range   Glucose-Capillary 98 65 - 99 mg/dL  Glucose, capillary     Status: Abnormal   Collection Time: 04/28/16 11:33 AM  Result Value Ref Range   Glucose-Capillary 161 (H) 65 - 99 mg/dL  Glucose, capillary     Status:  Abnormal   Collection Time: 04/28/16  4:45 PM  Result Value Ref Range   Glucose-Capillary 218 (H) 65 - 99 mg/dL   Comment 1 Notify RN    Comment 2 Document in Chart   Glucose, capillary     Status:  Abnormal   Collection Time: 04/28/16  9:29 PM  Result Value Ref Range   Glucose-Capillary 143 (H) 65 - 99 mg/dL  CBC     Status: Abnormal   Collection Time: 04/29/16  6:29 AM  Result Value Ref Range   WBC 10.0 4.0 - 10.5 K/uL   RBC 2.25 (L) 4.22 - 5.81 MIL/uL   Hemoglobin 6.5 (LL) 13.0 - 17.0 g/dL    Comment: DELTA CHECK NOTED REPEATED TO VERIFY CRITICAL RESULT CALLED TO, READ BACK BY AND VERIFIED WITH: BOKAIGON C RN AT [phone] ON 02.16.2018 BY COCHRANE S    HCT 19.4 (L) 39.0 - 52.0 %   MCV 86.2 78.0 - 100.0 fL   MCH 28.9 26.0 - 34.0 pg   MCHC 33.5 30.0 - 36.0 g/dL   RDW 14.4 11.5 - 15.5 %   Platelets 213 150 - 400 K/uL  Comprehensive metabolic panel     Status: Abnormal   Collection Time: 04/29/16  6:29 AM  Result Value Ref Range   Sodium 138 135 - 145 mmol/L   Potassium 4.5 3.5 - 5.1 mmol/L   Chloride 110 101 - 111 mmol/L   CO2 19 (L) 22 - 32 mmol/L   Glucose, Bld 112 (H) 65 - 99 mg/dL   BUN 71 (H) 6 - 20 mg/dL   Creatinine, Ser 4.88 (H) 0.61 - 1.24 mg/dL   Calcium 7.5 (L) 8.9 - 10.3 mg/dL   Total Protein 3.9 (L) 6.5 - 8.1 g/dL   Albumin 2.8 (L) 3.5 - 5.0 g/dL   AST 13 (L) 15 - 41 U/L   ALT 13 (L) 17 - 63 U/L   Alkaline Phosphatase 47 38 - 126 U/L   Total Bilirubin 1.0 0.3 - 1.2 mg/dL   GFR calc non Af Amer 10 (L) >60 mL/min   GFR calc Af Amer 12 (L) >60 mL/min    Comment: (NOTE) The eGFR has been calculated using the CKD EPI equation. This calculation has not been validated in all clinical situations. eGFR's persistently <60 mL/min signify possible Chronic Kidney Disease.    Anion gap 9 5 - 15  Glucose, capillary     Status: Abnormal   Collection Time: 04/29/16  7:50 AM  Result Value Ref Range   Glucose-Capillary 138 (H) 65 - 99 mg/dL  Type and screen [facility]     Status: None (Preliminary result)   Collection Time: 04/29/16  8:28 AM  Result Value Ref Range   ABO/RH(D) A POS    Antibody Screen NEG    Sample Expiration 05/02/2016    Unit Number [id number]    Blood Component Type RED CELLS,LR    Unit division 00    Status of Unit ALLOCATED    Transfusion Status OK TO TRANSFUSE    Crossmatch Result Compatible    Unit Number [id number]    Blood Component Type RED CELLS,LR    Unit division 00    Status of Unit ALLOCATED    Transfusion Status OK TO TRANSFUSE    Crossmatch Result Compatible   Prepare RBC     Status: None   Collection Time: 04/29/16  8:28 AM  Result Value Ref Range   Order Confirmation ORDER PROCESSED BY BLOOD BANK     No results found.             Blood pressure (!) 113/56, pulse 93, temperature 98.7 F (37.1 C), temperature source Oral, resp. rate 16, height _0  (1.651 m), weight 83.1 kg (183  lb 3.2 oz), SpO2 99 %.  Physical exam:   General--Alert white male  ENT-- nonicteric  Neck-- full range of motion  Heart-- normal S1 and S2  Lungs-- clear  Abdomen-- soft and completely nontender     Assessment: 1. Acute G.I. bleeding. It is difficult to tell if this is upper or lower. He did not have any diverticulosis noted 7 years ago on colonoscopy. He has had melenic stools for several days. With medication these taking this would certainly place them at risk for ulcer disease. Think it's reasonable to go ahead and treat him with PPI therapy. Unfortunately had breakfast approximately 2 1/2 to 3 hours ago and ate some scrambled eggs. We will need to wait an adequate time perform EGD I think we should plan on flexible sigmoid as well.  2. 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 Patient is being treated for this has been on hemodialysis.   Plan: 1. Agree with going ahead and given him PPI empirically. Keep them NPO and have scheduled him for EGD with  possible flexible sigmoidoscopy at 3 o'clock this afternoon. We'll give him tapwater enemas in case EGD is negative to see if we can isolative lesion in the colon. I have discussed this with the patient.   Henchy Mccauley JR,Vaiden Adames L 04/29/2016, 10:48 AM   This note was created using voice recognition software and minor errors may Have occurred unintentionally. Pager: [phone] If no answer or after hours call [phone]

## 2016-04-29 NOTE — Interval H&P Note (Signed)
History and Physical Interval Note:  04/29/2016 2:49 PM  Nicholas Hudson  has presented today for surgery, with the diagnosis of GI bleeding  The various methods of treatment have been discussed with the patient and family. After consideration of risks, benefits and other options for treatment, the patient has consented to  Procedure(s): ESOPHAGOGASTRODUODENOSCOPY (EGD) ([name]) FLEXIBLE SIGMOIDOSCOPY ([name]) as a surgical intervention .  The patient's history has been reviewed, patient examined, no change in status, stable for surgery.  I have reviewed the patient's chart and labs.  Questions were answered to the patient's satisfaction.     Daeshon Grammatico JR,Asusena Sigley L

## 2016-04-29 NOTE — Progress Notes (Signed)
CRITICAL VALUE ALERT  Critical value received:  Hemoglobin 6.4  Date of notification:  04/29/16  Time of notification:  1640  Critical value read back:Yes.    Nurse who received alert:  Allegra Grana, RN  MD notified (1st page):  Dr Oletta Darter  Time of first page:  1642  MD notified (2nd page):  Time of second page:  Responding MD:  Dr Oletta Darter  Time MD responded:  [phone]

## 2016-04-29 NOTE — Progress Notes (Signed)
CRITICAL VALUE ALERT  Critical value received: Hemoglobin 6.5  Date of notification:  04/29/16  Time of notification:  0714  Critical value read back:Yes.    Nurse who received alert:  Vinie Sill  MD notified (1st page):  Abrol,MD  Time of first page:  07:35  MD notified (2nd page):  Time of second page:  Responding MD:  Gwenlyn Found  Time MD responded: 07:37

## 2016-04-29 NOTE — H&P (View-Only) (Signed)
[facility] CONSULT Reason for consult: G.I. bleeding Referring Physician: [facility] hospitalist. PCP: Dr. Delfina Redwood. Primary G.I.: Dr. [name] is an 76 y.o. male.  HPI: he has a history of gout hypothyroidism sinus infections etc. He had been treated with antibiotics is an outpatient for recurrent sinus infections and began to feel particularly bad. He was also noted to be quite confused. He presented with AKI with a creatinine up to 4 when previously been normal. He has been followed by the nephrology and renal biopsy was obtained. 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 He has been treated with cytotoxin, Neurontin, insulin and prednisone. He has had intermittent dialysis as well as plasmapheresis. He has been eating reasonably well but notes that stools have been dark for the past several days. He denies any NSAID use and denies any dyspepsia. Today he passed 2 large bloody bowel movements. They tended to be bright red and maroon. He feels quite weak.   is hemoglobin is drop from 9.2 to 6.5. Blood transfusion has been ordered. He has been started on IV Protonix. His creatinine remains around 5 and is BUN is elevated. The patient has not had ulcers. In 2011 he had a normal screening colonoscopy by Dr. Wynetta Emery.  Past Medical History:  Diagnosis Date  . BPH (benign prostatic hyperplasia)   . Gout   . Hyperlipidemia   . Renal disorder   . Thyroid disease     Past Surgical History:  Procedure Laterality Date  . IR GENERIC HISTORICAL  04/22/2016   IR US GUIDE VASC ACCESS RIGHT 04/22/2016 Sandi Mariscal, MD [facility]  . IR GENERIC HISTORICAL  04/22/2016   IR FLUORO GUIDE CV LINE RIGHT 04/22/2016 Sandi Mariscal, MD [facility]    History reviewed. No pertinent family history.  Social History:  reports that he has never smoked. He has never used smokeless tobacco. He reports that he does not drink alcohol. His  drug history is not on file.  Allergies: No Known Allergies  Medications; Prior to Admission medications   Medication Sig Start Date End Date Taking? Authorizing Provider  allopurinol (ZYLOPRIM) 100 MG tablet Take 1 tablet (100 mg total) by mouth daily. 05/28/14  Yes Wallene Huh, DPM  aspirin EC 81 MG tablet Take 81 mg by mouth daily.   Yes Historical Provider, MD  cholecalciferol (VITAMIN D) 1000 UNITS tablet Take 2,000 Units by mouth daily.   Yes Historical Provider, MD  cyanocobalamin 2000 MCG tablet Take 2,000 mcg by mouth daily.   Yes Historical Provider, MD  finasteride (PROSCAR) 5 MG tablet Take 5 mg by mouth daily.   Yes Historical Provider, MD  gabapentin (NEURONTIN) 300 MG capsule Take 300 mg by mouth at bedtime. 04/14/16  Yes Historical Provider, MD  levothyroxine (SYNTHROID, LEVOTHROID) 200 MCG tablet Take 200 mcg by mouth daily before breakfast.  04/16/16  Yes Historical Provider, MD  Omega-3 Fatty Acids (FISH OIL) 1000 MG CAPS Take 1,000 mg by mouth daily.    Yes Historical Provider, MD  rosuvastatin (CRESTOR) 20 MG tablet Take 20 mg by mouth daily.   Yes Historical Provider, MD   . sodium chloride   Intravenous Once  . sodium chloride   Intravenous Once  . aspirin EC  81 mg Oral Daily  . atorvastatin  40 mg Oral q1800  . cyclophosphamide  75 mg Oral Q24H  . finasteride  5 mg Oral Daily  .  gabapentin  100 mg Oral QHS  . insulin aspart  0-15 Units Subcutaneous TID WC  . insulin aspart  0-5 Units Subcutaneous QHS  . insulin aspart  3 Units Subcutaneous TID WC  . insulin detemir  5 Units Subcutaneous Daily  . levothyroxine  200 mcg Oral QAC breakfast  . pantoprazole (PROTONIX) IV  40 mg Intravenous Q12H  . predniSONE  60 mg Oral Q breakfast  . senna  1 tablet Oral BID  . sulfamethoxazole-trimethoprim  1 tablet Oral Q M,W,F-1800   PRN Meds acetaminophen **OR** acetaminophen, calcium carbonate, fentaNYL, midazolam, ondansetron **OR** ondansetron (ZOFRAN) IV, traMADol,  zolpidem Results for orders placed or performed during the hospital encounter of 04/16/16 (from the past 48 hour(s))  Glucose, capillary     Status: Abnormal   Collection Time: 04/27/16 11:36 AM  Result Value Ref Range   Glucose-Capillary 135 (H) 65 - 99 mg/dL  CBC     Status: Abnormal   Collection Time: 04/27/16  2:00 PM  Result Value Ref Range   WBC 11.0 (H) 4.0 - 10.5 K/uL   RBC 3.16 (L) 4.22 - 5.81 MIL/uL   Hemoglobin 9.2 (L) 13.0 - 17.0 g/dL   HCT 27.6 (L) 39.0 - 52.0 %   MCV 87.3 78.0 - 100.0 fL   MCH 29.1 26.0 - 34.0 pg   MCHC 33.3 30.0 - 36.0 g/dL   RDW 14.1 11.5 - 15.5 %   Platelets 222 150 - 400 K/uL  Reticulocytes     Status: Abnormal   Collection Time: 04/27/16  2:00 PM  Result Value Ref Range   Retic Ct Pct 1.4 0.4 - 3.1 %   RBC. 3.16 (L) 4.22 - 5.81 MIL/uL   Retic Count, Manual 44.2 19.0 - 186.0 K/uL  I-STAT, chem 8     Status: Abnormal   Collection Time: 04/27/16  2:09 PM  Result Value Ref Range   Sodium 134 (L) 135 - 145 mmol/L   Potassium 4.6 3.5 - 5.1 mmol/L   Chloride 97 (L) 101 - 111 mmol/L   BUN 47 (H) 6 - 20 mg/dL   Creatinine, Ser 4.90 (H) 0.61 - 1.24 mg/dL   Glucose, Bld 185 (H) 65 - 99 mg/dL   Calcium, Ion 1.09 (L) 1.15 - 1.40 mmol/L   TCO2 22 0 - 100 mmol/L   Hemoglobin 8.8 (L) 13.0 - 17.0 g/dL   HCT 26.0 (L) 39.0 - 52.0 %  Glucose, capillary     Status: Abnormal   Collection Time: 04/27/16  5:47 PM  Result Value Ref Range   Glucose-Capillary 145 (H) 65 - 99 mg/dL   Comment 1 Notify RN   Glucose, capillary     Status: Abnormal   Collection Time: 04/27/16  9:46 PM  Result Value Ref Range   Glucose-Capillary 160 (H) 65 - 99 mg/dL  Glucose, capillary     Status: None   Collection Time: 04/28/16  8:11 AM  Result Value Ref Range   Glucose-Capillary 98 65 - 99 mg/dL  Glucose, capillary     Status: Abnormal   Collection Time: 04/28/16 11:33 AM  Result Value Ref Range   Glucose-Capillary 161 (H) 65 - 99 mg/dL  Glucose, capillary     Status:  Abnormal   Collection Time: 04/28/16  4:45 PM  Result Value Ref Range   Glucose-Capillary 218 (H) 65 - 99 mg/dL   Comment 1 Notify RN    Comment 2 Document in Chart   Glucose, capillary     Status:  Abnormal   Collection Time: 04/28/16  9:29 PM  Result Value Ref Range   Glucose-Capillary 143 (H) 65 - 99 mg/dL  CBC     Status: Abnormal   Collection Time: 04/29/16  6:29 AM  Result Value Ref Range   WBC 10.0 4.0 - 10.5 K/uL   RBC 2.25 (L) 4.22 - 5.81 MIL/uL   Hemoglobin 6.5 (LL) 13.0 - 17.0 g/dL    Comment: DELTA CHECK NOTED REPEATED TO VERIFY CRITICAL RESULT CALLED TO, READ BACK BY AND VERIFIED WITH: BOKAIGON C RN AT [phone] ON 02.16.2018 BY COCHRANE S    HCT 19.4 (L) 39.0 - 52.0 %   MCV 86.2 78.0 - 100.0 fL   MCH 28.9 26.0 - 34.0 pg   MCHC 33.5 30.0 - 36.0 g/dL   RDW 14.4 11.5 - 15.5 %   Platelets 213 150 - 400 K/uL  Comprehensive metabolic panel     Status: Abnormal   Collection Time: 04/29/16  6:29 AM  Result Value Ref Range   Sodium 138 135 - 145 mmol/L   Potassium 4.5 3.5 - 5.1 mmol/L   Chloride 110 101 - 111 mmol/L   CO2 19 (L) 22 - 32 mmol/L   Glucose, Bld 112 (H) 65 - 99 mg/dL   BUN 71 (H) 6 - 20 mg/dL   Creatinine, Ser 4.88 (H) 0.61 - 1.24 mg/dL   Calcium 7.5 (L) 8.9 - 10.3 mg/dL   Total Protein 3.9 (L) 6.5 - 8.1 g/dL   Albumin 2.8 (L) 3.5 - 5.0 g/dL   AST 13 (L) 15 - 41 U/L   ALT 13 (L) 17 - 63 U/L   Alkaline Phosphatase 47 38 - 126 U/L   Total Bilirubin 1.0 0.3 - 1.2 mg/dL   GFR calc non Af Amer 10 (L) >60 mL/min   GFR calc Af Amer 12 (L) >60 mL/min    Comment: (NOTE) The eGFR has been calculated using the CKD EPI equation. This calculation has not been validated in all clinical situations. eGFR's persistently <60 mL/min signify possible Chronic Kidney Disease.    Anion gap 9 5 - 15  Glucose, capillary     Status: Abnormal   Collection Time: 04/29/16  7:50 AM  Result Value Ref Range   Glucose-Capillary 138 (H) 65 - 99 mg/dL  Type and screen [facility]     Status: None (Preliminary result)   Collection Time: 04/29/16  8:28 AM  Result Value Ref Range   ABO/RH(D) A POS    Antibody Screen NEG    Sample Expiration 05/02/2016    Unit Number [id number]    Blood Component Type RED CELLS,LR    Unit division 00    Status of Unit ALLOCATED    Transfusion Status OK TO TRANSFUSE    Crossmatch Result Compatible    Unit Number [id number]    Blood Component Type RED CELLS,LR    Unit division 00    Status of Unit ALLOCATED    Transfusion Status OK TO TRANSFUSE    Crossmatch Result Compatible   Prepare RBC     Status: None   Collection Time: 04/29/16  8:28 AM  Result Value Ref Range   Order Confirmation ORDER PROCESSED BY BLOOD BANK     No results found.             Blood pressure (!) 113/56, pulse 93, temperature 98.7 F (37.1 C), temperature source Oral, resp. rate 16, height _0  (1.651 m), weight 83.1 kg (183  lb 3.2 oz), SpO2 99 %.  Physical exam:   General--Alert white male  ENT-- nonicteric  Neck-- full range of motion  Heart-- normal S1 and S2  Lungs-- clear  Abdomen-- soft and completely nontender     Assessment: 1. Acute G.I. bleeding. It is difficult to tell if this is upper or lower. He did not have any diverticulosis noted 7 years ago on colonoscopy. He has had melenic stools for several days. With medication these taking this would certainly place them at risk for ulcer disease. Think it's reasonable to go ahead and treat him with PPI therapy. Unfortunately had breakfast approximately 2 1/2 to 3 hours ago and ate some scrambled eggs. We will need to wait an adequate time perform EGD I think we should plan on flexible sigmoid as well.  2. 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 Patient is being treated for this has been on hemodialysis.   Plan: 1. Agree with going ahead and given him PPI empirically. Keep them NPO and have scheduled him for EGD with  possible flexible sigmoidoscopy at 3 o'clock this afternoon. We'll give him tapwater enemas in case EGD is negative to see if we can isolative lesion in the colon. I have discussed this with the patient.   Kennie Karapetian JR,Glorimar Stroope L 04/29/2016, 10:48 AM   This note was created using voice recognition software and minor errors may Have occurred unintentionally. Pager: [phone] If no answer or after hours call [phone]

## 2016-04-29 NOTE — Care Management Important Message (Signed)
Important Message  Patient Details  Name: Nicholas Hudson MRN: [id number] Date of Birth: Jul 15, 1940   Medicare Important Message Given:  Yes    Orbie Pyo 04/29/2016, 2:01 PM

## 2016-04-29 NOTE — Consult Note (Signed)
[facility] Consult/Admission Note  Nicholas Hudson Jun 12, 1940  [id number].    Requesting MD: Dr. Allyson Sabal Chief Complaint/Reason for Consult: GI Bleed  HPI:   Pt is a 76 y.o. male with medical history significant of PBH, gout, recurrent sinus infection, hypothyroidism. For 6 to 8 weeks pt has been having headaches, less active, more sleepy. Four days before arrival to the ED family notice pt to be confuse, and lethargic. He has had poor oral intake. They report fever 100.1 at home. Patient was seeing by his PCP 2 days prior and lab work was consistent with low sodium and renal failure. He was advised to drink plenty of fluids. He presented to the ED with inability to drink plenty amount of fluids due to nausea and vomiting. He vomited once, bile content. He denies flank pain or abdominal pain. Progressive weakness over last 6 weeks, worse over last few days.   ED Course: Cr at 4, sodium 126, k at 5.7, BUN 43, chest x ray with atelectasis, WBC at 8.4, Hb 13.   Currently: Patient states for roughly 1 week he's been having dark bowel movements. This morning he had stool that was soft and black. Patient denies having any abdominal pain. Associated nausea. He states he's never had black tarry stools in the past. Patient states he does drink alcohol every day and he denies use of NSAIDs or steroids. Patient denies chest pain, shortness of breath, LOC, blood in his vomit, recent  vomiting.  ROS:  Review of Systems  Constitutional: Negative for chills, diaphoresis and fever.  Eyes: Negative for discharge.  Respiratory: Negative for shortness of breath.   Cardiovascular: Negative for chest pain.  Gastrointestinal: Positive for melena. Negative for abdominal pain, diarrhea, nausea and vomiting.  Genitourinary: Negative for dysuria.  Skin: Negative for rash.  Neurological: Negative for dizziness and loss of consciousness.  All other systems reviewed and are negative.    History  reviewed. No pertinent family history.  Past Medical History:  Diagnosis Date  . BPH (benign prostatic hyperplasia)   . Gout   . Hyperlipidemia   . Renal disorder   . Thyroid disease     Past Surgical History:  Procedure Laterality Date  . IR GENERIC HISTORICAL  04/22/2016   IR US GUIDE VASC ACCESS RIGHT 04/22/2016 Sandi Mariscal, MD [facility]  . IR GENERIC HISTORICAL  04/22/2016   IR FLUORO GUIDE CV LINE RIGHT 04/22/2016 Sandi Mariscal, MD [facility]    Social History:  reports that he has never smoked. He has never used smokeless tobacco. He reports that he does not drink alcohol. His drug history is not on file.  Allergies: No Known Allergies  Medications Prior to Admission  Medication Sig Dispense Refill  . allopurinol (ZYLOPRIM) 100 MG tablet Take 1 tablet (100 mg total) by mouth daily. 30 tablet 6  . aspirin EC 81 MG tablet Take 81 mg by mouth daily.    . cholecalciferol (VITAMIN D) 1000 UNITS tablet Take 2,000 Units by mouth daily.    . cyanocobalamin 2000 MCG tablet Take 2,000 mcg by mouth daily.    . finasteride (PROSCAR) 5 MG tablet Take 5 mg by mouth daily.    Marland Kitchen gabapentin (NEURONTIN) 300 MG capsule Take 300 mg by mouth at bedtime.    Marland Kitchen levothyroxine (SYNTHROID, LEVOTHROID) 200 MCG tablet Take 200 mcg by mouth daily before breakfast.     . Omega-3 Fatty Acids (FISH OIL) 1000 MG CAPS Take 1,000 mg by mouth daily.     Marland Kitchen  rosuvastatin (CRESTOR) 20 MG tablet Take 20 mg by mouth daily.      Blood pressure (!) 142/53, pulse 76, temperature 98 F (36.7 C), temperature source Oral, resp. rate 19, height '5\' 5"'  (1.651 m), weight 185 lb 13.6 oz (84.3 kg), SpO2 100 %.  Physical Exam  Constitutional: He is well-developed, well-nourished, and in no distress. No distress.  Well appearing, pale, while male   HENT:  Head: Normocephalic and atraumatic.  Mouth/Throat: Oropharynx is clear and moist.  Eyes: Conjunctivae are normal. Right eye exhibits no discharge. Left eye exhibits no  discharge. No scleral icterus.  Neck: Normal range of motion. Neck supple.  Cardiovascular: Normal rate, regular rhythm, S1 normal, S2 normal, normal heart sounds and intact distal pulses.  Exam reveals no gallop and no friction rub.   No murmur heard. Pulses:      Radial pulses are 2+ on the right side, and 2+ on the left side.       Dorsalis pedis pulses are 2+ on the right side, and 2+ on the left side.  Pulmonary/Chest: Effort normal and breath sounds normal. No respiratory distress. He has no decreased breath sounds. He has no wheezes. He has no rhonchi. He has no rales.  Abdominal: Soft. Bowel sounds are normal. He exhibits no distension. There is no tenderness. There is no rigidity, no rebound and no guarding.  Obese abdomen, normal bowel sounds, nontender  Musculoskeletal: He exhibits edema. He exhibits no deformity.  Mild pitting edema noted to BLE's  Neurological: He is alert. GCS score is 15.  Skin: Skin is warm and dry. No rash noted. He is not diaphoretic.  Psychiatric: Mood and affect normal.  Nursing note and vitals reviewed.   Results for orders placed or performed during the hospital encounter of 04/16/16 (from the past 48 hour(s))  Glucose, capillary     Status: Abnormal   Collection Time: 04/27/16  5:47 PM  Result Value Ref Range   Glucose-Capillary 145 (H) 65 - 99 mg/dL   Comment 1 Notify RN   Glucose, capillary     Status: Abnormal   Collection Time: 04/27/16  9:46 PM  Result Value Ref Range   Glucose-Capillary 160 (H) 65 - 99 mg/dL  Glucose, capillary     Status: None   Collection Time: 04/28/16  8:11 AM  Result Value Ref Range   Glucose-Capillary 98 65 - 99 mg/dL  Glucose, capillary     Status: Abnormal   Collection Time: 04/28/16 11:33 AM  Result Value Ref Range   Glucose-Capillary 161 (H) 65 - 99 mg/dL  Glucose, capillary     Status: Abnormal   Collection Time: 04/28/16  4:45 PM  Result Value Ref Range   Glucose-Capillary 218 (H) 65 - 99 mg/dL    Comment 1 Notify RN    Comment 2 Document in Chart   Glucose, capillary     Status: Abnormal   Collection Time: 04/28/16  9:29 PM  Result Value Ref Range   Glucose-Capillary 143 (H) 65 - 99 mg/dL  CBC     Status: Abnormal   Collection Time: 04/29/16  6:29 AM  Result Value Ref Range   WBC 10.0 4.0 - 10.5 K/uL   RBC 2.25 (L) 4.22 - 5.81 MIL/uL   Hemoglobin 6.5 (LL) 13.0 - 17.0 g/dL    Comment: DELTA CHECK NOTED REPEATED TO VERIFY CRITICAL RESULT CALLED TO, READ BACK BY AND VERIFIED WITH: Verline Lema RN AT [phone] ON 02.16.2018 BY COCHRANE S  HCT 19.4 (L) 39.0 - 52.0 %   MCV 86.2 78.0 - 100.0 fL   MCH 28.9 26.0 - 34.0 pg   MCHC 33.5 30.0 - 36.0 g/dL   RDW 14.4 11.5 - 15.5 %   Platelets 213 150 - 400 K/uL  Comprehensive metabolic panel     Status: Abnormal   Collection Time: 04/29/16  6:29 AM  Result Value Ref Range   Sodium 138 135 - 145 mmol/L   Potassium 4.5 3.5 - 5.1 mmol/L   Chloride 110 101 - 111 mmol/L   CO2 19 (L) 22 - 32 mmol/L   Glucose, Bld 112 (H) 65 - 99 mg/dL   BUN 71 (H) 6 - 20 mg/dL   Creatinine, Ser 4.88 (H) 0.61 - 1.24 mg/dL   Calcium 7.5 (L) 8.9 - 10.3 mg/dL   Total Protein 3.9 (L) 6.5 - 8.1 g/dL   Albumin 2.8 (L) 3.5 - 5.0 g/dL   AST 13 (L) 15 - 41 U/L   ALT 13 (L) 17 - 63 U/L   Alkaline Phosphatase 47 38 - 126 U/L   Total Bilirubin 1.0 0.3 - 1.2 mg/dL   GFR calc non Af Amer 10 (L) >60 mL/min   GFR calc Af Amer 12 (L) >60 mL/min    Comment: (NOTE) The eGFR has been calculated using the CKD EPI equation. This calculation has not been validated in all clinical situations. eGFR's persistently <60 mL/min signify possible Chronic Kidney Disease.    Anion gap 9 5 - 15  Glucose, capillary     Status: Abnormal   Collection Time: 04/29/16  7:50 AM  Result Value Ref Range   Glucose-Capillary 138 (H) 65 - 99 mg/dL  Type and screen [place]     Status: None (Preliminary result)   Collection Time: 04/29/16  8:28 AM  Result Value Ref Range    ISSUE DATE / TIME [id number]    Blood Product Unit Number [id number]    PRODUCT CODE [id number]    Unit Type and Rh 6333    Blood Product Expiration Date [id number]    ISSUE DATE / TIME [id number]    Blood Product Unit Number [id number]    PRODUCT CODE [id number]    Unit Type and Rh 6200    Blood Product Expiration Date [id number]   Prepare RBC     Status: None   Collection Time: 04/29/16  8:28 AM  Result Value Ref Range   Order Confirmation ORDER PROCESSED BY BLOOD BANK   Glucose, capillary     Status: Abnormal   Collection Time: 04/29/16 12:16 PM  Result Value Ref Range   Glucose-Capillary 184 (H) 65 - 99 mg/dL  Glucose, capillary     Status: Abnormal   Collection Time: 04/29/16  4:38 PM  Result Value Ref Range   Glucose-Capillary 205 (H) 65 - 99 mg/dL   Comment 1 Notify RN    Comment 2 Document in Chart    No results found.    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 Patient was transfused with 2 units of packed red blood cells. - We have been asked to see the patient in the event the bleeding returns and will need surgical intervention  No surgical  intervention is required at this time. Monitor hemoglobin and stools.  We will continue to follow this patient. Thank you for the consult.  Kalman Drape, [facility] Surgery 04/29/2016, 4:51 PM Pager: [phone] Consults: [phone] Mon-Fri 7:00 am-4:30 pm Sat-Sun 7:00 am-11:30 am

## 2016-04-30 DIAGNOSIS — N170 Acute kidney failure with tubular necrosis: Principal | ICD-10-CM

## 2016-04-30 LAB — COMPREHENSIVE METABOLIC PANEL
ALBUMIN: 2.9 g/dL — AB (ref 3.5–5.0)
ALT: 15 U/L — ABNORMAL LOW (ref 17–63)
ANION GAP: 10 (ref 5–15)
AST: 12 U/L — ABNORMAL LOW (ref 15–41)
Alkaline Phosphatase: 49 U/L (ref 38–126)
BILIRUBIN TOTAL: 0.6 mg/dL (ref 0.3–1.2)
BUN: 90 mg/dL — ABNORMAL HIGH (ref 6–20)
CHLORIDE: 114 mmol/L — AB (ref 101–111)
CO2: 18 mmol/L — ABNORMAL LOW (ref 22–32)
Calcium: 7.9 mg/dL — ABNORMAL LOW (ref 8.9–10.3)
Creatinine, Ser: 4.63 mg/dL — ABNORMAL HIGH (ref 0.61–1.24)
GFR calc Af Amer: 13 mL/min — ABNORMAL LOW (ref >60)
GFR, EST NON AFRICAN AMERICAN: 11 mL/min — AB (ref >60)
Glucose, Bld: 142 mg/dL — ABNORMAL HIGH (ref 65–99)
POTASSIUM: 4.9 mmol/L (ref 3.5–5.1)
Sodium: 142 mmol/L (ref 135–145)
TOTAL PROTEIN: 4.3 g/dL — AB (ref 6.5–8.1)

## 2016-04-30 LAB — GLUCOSE, CAPILLARY
GLUCOSE-CAPILLARY: 111 mg/dL — AB (ref 65–99)
GLUCOSE-CAPILLARY: 126 mg/dL — AB (ref 65–99)
Glucose-Capillary: 94 mg/dL (ref 65–99)
Glucose-Capillary: 96 mg/dL (ref 65–99)

## 2016-04-30 LAB — CBC
HEMATOCRIT: 21.3 % — AB (ref 39.0–52.0)
HEMATOCRIT: 27.1 % — AB (ref 39.0–52.0)
HEMOGLOBIN: 9 g/dL — AB (ref 13.0–17.0)
Hemoglobin: 7 g/dL — ABNORMAL LOW (ref 13.0–17.0)
MCH: 27.3 pg (ref 26.0–34.0)
MCH: 27.4 pg (ref 26.0–34.0)
MCHC: 32.9 g/dL (ref 30.0–36.0)
MCHC: 33.2 g/dL (ref 30.0–36.0)
MCV: 83.2 fL (ref 78.0–100.0)
MCV: 82.4 fL (ref 78.0–100.0)
PLATELETS: 191 10*3/uL (ref 150–400)
Platelets: 174 10*3/uL (ref 150–400)
RBC: 2.56 MIL/uL — ABNORMAL LOW (ref 4.22–5.81)
RBC: 3.29 MIL/uL — ABNORMAL LOW (ref 4.22–5.81)
RDW: 16.4 % — AB (ref 11.5–15.5)
RDW: 17.8 % — AB (ref 11.5–15.5)
WBC: 10.3 10*3/uL (ref 4.0–10.5)
WBC: 11.5 10*3/uL — ABNORMAL HIGH (ref 4.0–10.5)

## 2016-04-30 LAB — PREPARE RBC (CROSSMATCH)

## 2016-04-30 MED ORDER — SODIUM CHLORIDE 0.9 % IV SOLN: Freq: Once | INTRAVENOUS | Status: AC

## 2016-04-30 MED ORDER — SODIUM CHLORIDE 0.9 % IV SOLN: Freq: Once | INTRAVENOUS | Status: DC

## 2016-04-30 MED ORDER — LEVOTHYROXINE SODIUM 100 MCG IV SOLR
100.0000 ug | Freq: Every day | INTRAVENOUS | Status: DC
  Administered 2016-04-30 – 2016-05-02 (×3): 100 ug via INTRAVENOUS
  Filled 2016-04-30 (×3): qty 5

## 2016-04-30 NOTE — Progress Notes (Signed)
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 sodium chloride   Intravenous Once  . sodium chloride   Intravenous Once  . gabapentin  100 mg Oral QHS  . insulin aspart  0-15 Units Subcutaneous TID WC  . insulin aspart  0-5 Units Subcutaneous QHS  . insulin aspart  3 Units Subcutaneous TID WC  . levothyroxine  200 mcg Oral QAC breakfast  . methylPREDNISolone (SOLU-MEDROL) injection  60 mg Intravenous Q24H  . sulfamethoxazole-trimethoprim  1 tablet Oral Q M,W,F-1800   Continuous Infusions: . 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 MPO ANCA + 29.1 Neg: ANA, ASO, GBM, nl C3+C4  Physical Exam:  Blood pressure 121/64, pulse 71, temperature 98 F (36.7 C), temperature source Oral, resp. rate 14, height 5\' 5"  (1.651 m), weight 84.1 kg (185 lb 6.5 oz), SpO2 100 %. RRR R IJ TDC C/D/I CTAB S/nt, +BS quite active No LEE Nonfocal NCAT EOMI, glasses on  A 1. 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 If contributed to duodenal ulder 1. 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 Minimal Scarring. 2. Completed pulse steroids; Now on solumedrol  60mg /d 3. PO Cytxoan 7mg /day on hold given duodenal ulcer 4. TPE 1.5PV x7, QOD; Albumin colloid, started 2/12, 2/14; on hold currenlty 5. Appears at this time to have sufficent GFR to not need HD 6. DS TMP/SMX TIW PCP prophylaxis 2. HLD 3. Gout 4. UGIB 04/29/15 2/2 duodenal ulcer 5. 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 Hold on TPE again given transient coagulopathy it can cause and his instability 2. Cont to hold cyclophosphamide as well 3. Can rec pRBC without dialysis given robust UOP.   4. Hold on HD 5. Will need FFP with upcoming TPE given GIB   Pearson Grippe MD 04/30/2016, 7:47 AM   Recent Labs Lab 04/24/16 0217 04/25/16 0511  04/26/16 0735 04/27/16 0450 04/27/16 1409 04/29/16 0629 04/30/16 0451  NA 133* 131*  < > 138 134* 134* 138 142  K 4.3 3.8  < > 4.2 4.3 4.6 4.5 4.9  CL 96* 97*  < > 104 100* 97* 110 114*  CO2 24 22  --  23 23  --  19* 18*  GLUCOSE 174* 111*  < > 113* 124* 185* 112* 142*  BUN 47* 62*  < > 33* 43* 47* 71* 90*  CREATININE 4.69* 5.47*  < > 3.66* 4.40* 4.90* 4.88* 4.63*  CALCIUM 7.2* 7.3*  --  7.8* 7.5*  --  7.5* 7.9*  PHOS 6.1* 7.1*  --  5.2*  --   --   --   --   < > = values in this interval  not displayed.  Recent Labs Lab 04/29/16 1639 04/29/16 2220 04/30/16 0451  WBC 10.3 9.2 10.3  HGB 6.4* 7.1* 7.0*  HCT 19.1* 21.1* 21.3*  MCV 84.9 83.7 83.2  PLT 186 184 191

## 2016-04-30 NOTE — Progress Notes (Signed)
eLink Physician-Brief Progress Note Patient Name: Nicholas Hudson DOB: 1940/04/28 MRN: [id number]   Date of Service  04/30/2016  HPI/Events of Note  Patient remains hemodynamically stable. Blood pressure 122/68, heart rate 74, respiratory rate 16, 100% O2 saturation.   He had a large maroon-colored stool this morning.   Repeat hemoglobin stable at 7.   eICU Interventions  Will transfuse 1 unit packed red blood cells.   For now, pt can remain in SDU.      Intervention Category Major Interventions: Hemorrhage - evaluation and management  Rush Landmark 04/30/2016, 5:43 AM

## 2016-04-30 NOTE — Progress Notes (Signed)
Nicholas Hudson 1:56 PM  Subjective: Patient seen and examined and his case discussed with my partner Dr. Oletta Lamas and his endoscopic pictures were reviewed and his case discussed with his wife and multiple family members as well as and is history was reviewed and he had some bowel movements this morning but none since which was probably old blood and he has no new complaints  Objective: Vital signs stable afebrile no acute distress abdomen is soft nontender post transfusion hemoglobin increased to 9 unfortunately BUN elevated creatinine slightly decreased Assessment: Multiple medical problems including recent GI bleed secondary to small ulcer status post endoscopic therapy  Plan: If no signs of bleeding tomorrow may begin clear liquids and repeat endoscopy when necessary and will check on tomorrow  [facility] E  Pager [phone] After 5PM or if no answer call [phone]

## 2016-04-30 NOTE — Progress Notes (Signed)
1 Day Post-Op  Subjective: Nicholas Hudson, no abdominal pain  Objective: Vital signs in last 24 hours: Temp:  [97.6 F (36.4 C)-99.1 F (37.3 C)] 98 F (36.7 C) (02/17 0700) Pulse Rate:  [71-110] 71 (02/17 0700) Resp:  [10-25] 14 (02/17 0700) BP: (103-177)/(41-82) 121/64 (02/17 0700) SpO2:  [97 %-100 %] 100 % (02/17 0700) Weight:  [84.1 kg (185 lb 6.5 oz)-84.3 kg (185 lb 13.6 oz)] 84.1 kg (185 lb 6.5 oz) (02/17 0500) Last BM Date: 04/29/16  Intake/Output from previous day: 02/16 0701 - 02/17 0700 In: 1104.1 [I.V.:382.1; Blood:722] Out: 1900 [Urine:1900] Intake/Output this shift: No intake/output data recorded.  General appearance: alert and cooperative Resp: clear to auscultation bilaterally Cardio: regular rate and rhythm GI: soft, mod dist, NT, +BS  Lab Results:   Recent Labs  04/29/16 2220 04/30/16 0451  WBC 9.2 10.3  HGB 7.1* 7.0*  HCT 21.1* 21.3*  PLT 184 191   BMET  Recent Labs  04/29/16 0629 04/30/16 0451  NA 138 142  K 4.5 4.9  CL 110 114*  CO2 19* 18*  GLUCOSE 112* 142*  BUN 71* 90*  CREATININE 4.88* 4.63*  CALCIUM 7.5* 7.9*   PT/INR No results for input(s): LABPROT, INR in the last 72 hours. ABG No results for input(s): PHART, HCO3 in the last 72 hours.  Invalid input(s): PCO2, PO2  Studies/Results: No results found.  Anti-infectives: Anti-infectives    Start     Dose/Rate Route Frequency Ordered Stop   04/25/16 1800  sulfamethoxazole-trimethoprim (BACTRIM DS,SEPTRA DS) 800-160 MG per tablet 1 tablet     1 tablet Oral Every M-W-F (1800) 04/23/16 1445     04/22/16 0926  ceFAZolin (ANCEF) 2-4 GM/100ML-% IVPB    Comments:  Leak, Brandi   : cabinet override      04/22/16 0926 04/22/16 0937   04/22/16 0715  ceFAZolin (ANCEF) IVPB 1 g/50 mL premix     1 g 100 mL/hr over 30 Minutes Intravenous To Radiology 04/22/16 0712 04/22/16 1012   04/19/16 1000  vancomycin (VANCOCIN) IVPB 1000 mg/200 mL premix  Status:  Discontinued     1,000  mg 200 mL/hr over 60 Minutes Intravenous Every 48 hours 04/17/16 0845 04/18/16 1654   04/17/16 1800  piperacillin-tazobactam (ZOSYN) IVPB 2.25 g  Status:  Discontinued     2.25 g 100 mL/hr over 30 Minutes Intravenous Every 8 hours 04/17/16 0845 04/20/16 1735   04/17/16 0815  piperacillin-tazobactam (ZOSYN) IVPB 2.25 g     2.25 g 100 mL/hr over 30 Minutes Intravenous  Once 04/17/16 0810 04/17/16 1058   04/17/16 0815  vancomycin (VANCOCIN) IVPB 1000 mg/200 mL premix     1,000 mg 200 mL/hr over 60 Minutes Intravenous  Once 04/17/16 0810 04/17/16 1122      Assessment/Plan: s/p Procedure(s): ESOPHAGOGASTRODUODENOSCOPY (EGD) ([name]) Bleeding DU - S/P upper endo with clip by Dr. Oletta Lamas. Nicholas Hudson. Agree with TF 1u and will order a second. I suspect a lot of this is old blood still passing as well. We will follow.  LOS: 14 days    Laiken Nohr E 04/30/2016

## 2016-04-30 NOTE — Progress Notes (Signed)
[facility] PROGRESS NOTE  MIN SARRA [id number] DOB: 09-21-1940 DOA: 04/16/2016  PCP: Kandice Hams, MD  Brief History/Interval Summary: Nicholas Hudson is an 76 y.o. male with medical history significant of PBH, gout, recurrent sinus infection, hypothyroidism. Patient with history of sinus, polyps problems, infection for last 6 to 8 weeks, he has received 2 course of antibiotics by Dr Lucia Gaskins. Patient was found to have worsening renal function. He was hospitalized for further management. Patient seen by nephrology. Renal function continued to get worse. 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 Patient started on dialysis. Also started on plasmapheresis.   Assessment/Plan:  Principal Problem:   Acute glomerulonephritis Active Problems:   AKI (acute kidney injury) ([place])   Abnormal transaminases   Frequent headaches   Hypothyroidism   Hyponatremia   Sepsis ([place])   Metabolic encephalopathy   ANCA-associated vasculitis (HCC)    GI bleed Started having melanotic stools early this morning Hemoglobin drop from 8.8-6.5 Started on IV PPI, as the patient is on prednisone Transfuse 2 units of packed red blood cells Notified Dr. Joelyn Oms about transfusing 2 units [facility] gastroenterology for further evaluation EGD                 -  non-obstructing oozing duodenal ulcer with a                            visible vessel. Treatment not successful. Treated                            with bipolar cautery. Injected. Clip was placed.                            Continue  ICU for ongoing care.                           -NPO.                           -Continue present medications.  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 His creatinine was 1.0 about a year ago. Nephrology was consulted. Autoimmune workup is positive for an elevated myeloperoxidase antibody. 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  Renal biopsy was performed 2/7. 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 Completed pulse steroids;   Pred 60 mg/day which was changed to iv solumedrol .  Patient started on cyclophosphamide which is now on hold  , continue  Bactrim for prophylaxis.   .  Being dialyzed based on renal function and  urine output . Slight increase in creatinine 4.4>4.9>4.63 .  Marland KitchenAlso, on plasmapheresis now starting 2/12.TPE 1.5PV x7, QOD Renal function stable, good uop    Sepsis of unclear source Culture data negative till date. Repeat chest x-ray is negative. Patient was initially started on the sepsis pathway. He was given vancomycin and Zosyn. Vancomycin was discontinued due to renal failure. No  clear infectious foci. MRI brain did not show any acute abnormalities. MRI abdomen did not show any intra-abdominal process. Zosyn was discontinued as well. Patient could've had a viral syndrome. MRI brain did show paranasal sinus disease and mastoid fluid. However patient has recently completed multiple courses of antibiotics for same. Continue to monitor off of antibiotics. Sepsis physiology, resolved. WBC is normal.   Metabolic encephalopathy Resolved. MRI brain did not show any acute process. Confusion could have been due to acute renal failure.   Elevated alkaline phosphatase Patient noted an elevated alkaline phosphatase. AST, ALT normal. Bilirubin normal. Abdominal ultrasound showed possible pancreatic mass. MRI did not show any lesion in the pancreas. Finding on the ultrasound was thought to be artifactual from bowel. Outpatient follow-up for elevated  alkaline phosphatase.  Elevated blood pressure Patient does not seem to be on any blood pressure lowering agents at home. Blood pressure could be elevated due to effect of steroid. Continue to monitor for now. If remains persistently elevated, may have to initiate antihypertensive agent.  Hyperglycemia No history of diabetes as outpatient. Hyperglycemia is likely due to steroids. Since he is now just on oral steroids, we decreased the dose of his insulin. HbA1c was 6.5. Continue to monitor CBGs and adjust dose of insulin as needed.   Hypothyroidism Continue Synthroid.  Hyponatremia Has improved after dialysis.   DVT Prophylaxis: SCDs  Code Status: Full code  Family Communication: Discussed with patient, wife and daughter Disposition Plan: continue  ICU for mx of duodenal ulcer     Consultants: Nephrology, gastroenterology  Procedures:  Renal biopsy 2/7  Placement of HD catheter 2/9  Echocardiogram Study Conclusions  - Left ventricle: The cavity size was normal. There was moderate   concentric hypertrophy. Systolic function was normal. The   estimated ejection fraction was in the range of 50% to 55%. Wall   motion was normal; there were no regional wall motion   abnormalities. Doppler parameters are consistent with abnormal   left ventricular relaxation (grade 1 diastolic dysfunction). - Aortic valve: Trileaflet; mildly thickened, mildly calcified   leaflets. - Aorta: Ascending aortic diameter: 42 mm (S). - Ascending aorta: The ascending aorta was mildly dilated. - Mitral valve: There was mild regurgitation. - Left atrium: The atrium was mildly dilated.  Hemodialysis  Plasmapheresis starting 2/12  Antibiotics: Initially started on vancomycin and Zosyn. Both of these antibiotics have been discontinued.  Subjective/Interval History: Pt with large bloody BM, estimated to be 750 to 1000 ml output at 4:00 am  ROS: Denies any chest pain or shortness of  breath  Objective:  Vital Signs  Vitals:   04/30/16 0500 04/30/16 0545 04/30/16 0600 04/30/16 0700  BP: 122/68 (!) 123/57 (!) 124/59 121/64  Pulse: 78 77 75 71  Resp: 15 15 15 14   Temp:  97.7 F (36.5 C) 97.6 F (36.4 C) 98 F (36.7 C)  TempSrc:  Oral Oral Oral  SpO2: 97% 100% 100% 100%  Weight: 84.1 kg (185 lb 6.5 oz)     Height:        Intake/Output Summary (Last 24 hours) at 04/30/16 0802 Last data filed at 04/30/16 0600  Gross per 24 hour  Intake          1104.08 ml  Output             1900 ml  Net          -795.92 ml   Filed Weights   04/28/16 2131 04/29/16 1635 04/30/16 0500  Weight:  83.1 kg (183 lb 3.2 oz) 84.3 kg (185 lb 13.6 oz) 84.1 kg (185 lb 6.5 oz)    General appearance: alert, cooperative, appears stated age and no distress Resp: clear to auscultation bilaterally Cardio: regular rate and rhythm, S1, S2 normal, no murmur, click, rub or gallop GI: soft, non-tender; bowel sounds normal; no masses,  no organomegaly Extremities: Does have some edema bilateral lower extremity Neurologic: Awake and alert. No focal deficits.  Lab Results:  Data Reviewed: I have personally reviewed following labs and imaging studies  CBC:  Recent Labs Lab 04/27/16 1400 04/27/16 1409 04/29/16 0629 04/29/16 1639 04/29/16 2220 04/30/16 0451  WBC 11.0*  --  10.0 10.3 9.2 10.3  HGB 9.2* 8.8* 6.5* 6.4* 7.1* 7.0*  HCT 27.6* 26.0* 19.4* 19.1* 21.1* 21.3*  MCV 87.3  --  86.2 84.9 83.7 83.2  PLT 222  --  213 186 184 [id number]    Basic Metabolic Panel:  Recent Labs Lab 04/24/16 0217 04/25/16 0511  04/26/16 0735 04/27/16 0450 04/27/16 1409 04/29/16 0629 04/30/16 0451  NA 133* 131*  < > 138 134* 134* 138 142  K 4.3 3.8  < > 4.2 4.3 4.6 4.5 4.9  CL 96* 97*  < > 104 100* 97* 110 114*  CO2 24 22  --  23 23  --  19* 18*  GLUCOSE 174* 111*  < > 113* 124* 185* 112* 142*  BUN 47* 62*  < > 33* 43* 47* 71* 90*  CREATININE 4.69* 5.47*  < > 3.66* 4.40* 4.90* 4.88* 4.63*  CALCIUM  7.2* 7.3*  --  7.8* 7.5*  --  7.5* 7.9*  PHOS 6.1* 7.1*  --  5.2*  --   --   --   --   < > = values in this interval not displayed.  GFR: Estimated Creatinine Clearance: 13.3 mL/min (by C-G formula based on SCr of 4.63 mg/dL (H)).  Liver Function Tests:  Recent Labs Lab 04/24/16 0217 04/25/16 0511 04/26/16 0735 04/29/16 0629 04/30/16 0451  AST  --   --   --  13* 12*  ALT  --   --   --  13* 15*  ALKPHOS  --   --   --  47 49  BILITOT  --   --   --  1.0 0.6  PROT  --   --   --  3.9* 4.3*  ALBUMIN 1.8* 2.0* 3.6 2.8* 2.9*    CBG:  Recent Labs Lab 04/28/16 2129 04/29/16 0750 04/29/16 1216 04/29/16 1638 04/29/16 2259  GLUCAP 143* 138* 184* 205* 131*     Recent Results (from the past 240 hour(s))  MRSA PCR Screening     Status: None   Collection Time: 04/26/16  2:02 PM  Result Value Ref Range Status   MRSA by PCR NEGATIVE NEGATIVE Final    Comment:        The GeneXpert MRSA Assay (FDA approved for NASAL specimens only), is one component of a comprehensive MRSA colonization surveillance program. It is not intended to diagnose MRSA infection nor to guide or monitor treatment for MRSA infections.   MRSA PCR Screening     Status: None   Collection Time: 04/29/16  4:42 PM  Result Value Ref Range Status   MRSA by PCR NEGATIVE NEGATIVE Final    Comment:        The GeneXpert MRSA Assay (FDA approved for NASAL specimens only), is one component of a comprehensive MRSA colonization surveillance program. It is  not intended to diagnose MRSA infection nor to guide or monitor treatment for MRSA infections.       Radiology Studies: No results found.   Medications:  Scheduled: . sodium chloride   Intravenous Once  . sodium chloride   Intravenous Once  . sodium chloride   Intravenous Once  . gabapentin  100 mg Oral QHS  . insulin aspart  0-15 Units Subcutaneous TID WC  . insulin aspart  0-5 Units Subcutaneous QHS  . insulin aspart  3 Units Subcutaneous TID WC   . levothyroxine  200 mcg Oral QAC breakfast  . methylPREDNISolone (SOLU-MEDROL) injection  60 mg Intravenous Q24H  . sulfamethoxazole-trimethoprim  1 tablet Oral Q M,W,F-1800   Continuous: . pantoprozole (PROTONIX) infusion 8 mg/hr (04/30/16 0154)   [id number] **OR** acetaminophen, ondansetron **OR** ondansetron (ZOFRAN) IV, traMADol, zolpidem      LOS: 14 days   [facility]  [facility] Pager [phone] 04/30/2016, 8:02 AM  If 7PM-7AM, please contact night-coverage at [email or web address], password [facility]

## 2016-04-30 NOTE — Progress Notes (Signed)
Pt with large bloody BM, estimated to be 750 to 1000 ml output.  Notified Dr. Avon Gully via phone.  CBC drawn and awaiting results.  MD ordered 2 units PRBCs.   VSS, pt remains AAO x 4.

## 2016-05-01 LAB — BASIC METABOLIC PANEL
ANION GAP: 6 (ref 5–15)
Anion gap: 12 (ref 5–15)
BUN: 93 mg/dL — ABNORMAL HIGH (ref 6–20)
BUN: 85 mg/dL — AB (ref 6–20)
CALCIUM: 7.9 mg/dL — AB (ref 8.9–10.3)
CO2: 19 mmol/L — ABNORMAL LOW (ref 22–32)
CO2: 20 mmol/L — ABNORMAL LOW (ref 22–32)
CREATININE: 4.17 mg/dL — AB (ref 0.61–1.24)
Calcium: 7.7 mg/dL — ABNORMAL LOW (ref 8.9–10.3)
Chloride: 117 mmol/L — ABNORMAL HIGH (ref 101–111)
Chloride: 111 mmol/L (ref 101–111)
Creatinine, Ser: 4.54 mg/dL — ABNORMAL HIGH (ref 0.61–1.24)
GFR calc Af Amer: 13 mL/min — ABNORMAL LOW (ref >60)
GFR calc Af Amer: 15 mL/min — ABNORMAL LOW (ref >60)
GFR, EST NON AFRICAN AMERICAN: 11 mL/min — AB (ref >60)
GFR, EST NON AFRICAN AMERICAN: 13 mL/min — AB (ref >60)
GLUCOSE: 145 mg/dL — AB (ref 65–99)
Glucose, Bld: 193 mg/dL — ABNORMAL HIGH (ref 65–99)
Potassium: 5.8 mmol/L — ABNORMAL HIGH (ref 3.5–5.1)
Potassium: 4.4 mmol/L (ref 3.5–5.1)
SODIUM: 142 mmol/L (ref 135–145)
SODIUM: 143 mmol/L (ref 135–145)

## 2016-05-01 LAB — GLUCOSE, CAPILLARY
GLUCOSE-CAPILLARY: 114 mg/dL — AB (ref 65–99)
GLUCOSE-CAPILLARY: 170 mg/dL — AB (ref 65–99)
GLUCOSE-CAPILLARY: 122 mg/dL — AB (ref 65–99)
Glucose-Capillary: 110 mg/dL — ABNORMAL HIGH (ref 65–99)

## 2016-05-01 LAB — CBC
HCT: 24.1 % — ABNORMAL LOW (ref 39.0–52.0)
Hemoglobin: 8.2 g/dL — ABNORMAL LOW (ref 13.0–17.0)
MCH: 27.9 pg (ref 26.0–34.0)
MCHC: 34 g/dL (ref 30.0–36.0)
MCV: 82 fL (ref 78.0–100.0)
PLATELETS: 146 10*3/uL — AB (ref 150–400)
RBC: 2.94 MIL/uL — ABNORMAL LOW (ref 4.22–5.81)
RDW: 17.6 % — AB (ref 11.5–15.5)
WBC: 8.2 10*3/uL (ref 4.0–10.5)

## 2016-05-01 LAB — POTASSIUM: POTASSIUM: 5 mmol/L (ref 3.5–5.1)

## 2016-05-01 MED ORDER — SODIUM BICARBONATE 8.4 % IV SOLN
50.0000 meq | Freq: Once | INTRAVENOUS | Status: AC
  Administered 2016-05-01: 50 meq via INTRAVENOUS
  Filled 2016-05-01: qty 50

## 2016-05-01 MED ORDER — SODIUM BICARBONATE 650 MG PO TABS
650.0000 mg | ORAL_TABLET | Freq: Two times a day (BID) | ORAL | Status: DC
  Administered 2016-05-01 – 2016-05-13 (×25): 650 mg via ORAL
  Filled 2016-05-01 (×25): qty 1

## 2016-05-01 MED ORDER — SODIUM CHLORIDE 0.9 % IV SOLN
1.0000 g | Freq: Once | INTRAVENOUS | Status: AC
  Administered 2016-05-01: 1 g via INTRAVENOUS
  Filled 2016-05-01: qty 10

## 2016-05-01 MED ORDER — DEXTROSE 50 % IV SOLN
1.0000 | Freq: Once | INTRAVENOUS | Status: AC
  Administered 2016-05-01: 50 mL via INTRAVENOUS
  Filled 2016-05-01: qty 50

## 2016-05-01 MED ORDER — SODIUM POLYSTYRENE SULFONATE 15 GM/60ML PO SUSP
30.0000 g | Freq: Once | ORAL | Status: AC
  Administered 2016-05-01: 30 g via ORAL
  Filled 2016-05-01: qty 120

## 2016-05-01 MED ORDER — INSULIN ASPART 100 UNIT/ML IV SOLN
10.0000 [IU] | Freq: Once | INTRAVENOUS | Status: AC
  Administered 2016-05-01: 10 [IU] via INTRAVENOUS

## 2016-05-01 MED ORDER — CALCIUM GLUCONATE 10 % IV SOLN
1.0000 g | Freq: Once | INTRAVENOUS | Status: DC
Start: 2016-05-01 — End: 2016-05-01

## 2016-05-01 NOTE — Progress Notes (Signed)
Report called to RN. Vitals stable. Wife at bedside.

## 2016-05-01 NOTE — Progress Notes (Signed)
eLink Physician-Brief Progress Note Patient Name: Nicholas Hudson DOB: 22-Dec-1940 MRN: [id number]   Date of Service  05/01/2016  HPI/Events of Note  Nurse calls with patient's potassium being elevated 5.8.   Nurse also mentioned that pt did not have a main attending round on the pt.   Patient remains hemodynamically stable. Blood pressure 128/64, pulse 56, respiratory rate 14, sats 98%.   Patient had endoscopy on Friday and was found to have a bleeding duodenal ulcer. She was transferred to 2100 post EGD. It was my understanding as well as my discussion with the nurse that patient was in step downs unit status.    eICU Interventions  I discussed the case with Dr. Tamala Julian,  [facility]. Patient will remain under [facility] service. He is currently in step down unit status.   Will treat hyperkalemia with medicines. Repeat potassium later.      Intervention Category Major Interventions: Electrolyte abnormality - evaluation and management  Rush Landmark 05/01/2016, 4:46 AM

## 2016-05-01 NOTE — Progress Notes (Signed)
Attempted to call report to RN. Awaiting call back for report.

## 2016-05-01 NOTE — Progress Notes (Signed)
2nd attempt to give report. Charge RN requested to call back for report when new room is clean. Awaiting call back.

## 2016-05-01 NOTE — Progress Notes (Signed)
[facility] PROGRESS NOTE  MYER DAGGETT [id number] DOB: June 16, 1940 DOA: 04/16/2016  PCP: Kandice Hams, MD  Brief History/Interval Summary: Nicholas Hudson is an 76 y.o. male with medical history significant of PBH, gout, recurrent sinus infection, hypothyroidism. Patient with history of sinus, polyps problems, infection for last 6 to 8 weeks, he has received 2 course of antibiotics by Dr Lucia Gaskins. Patient was found to have worsening renal function. He was hospitalized for further management. Patient seen by nephrology. Renal function continued to get worse. 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 Patient started on dialysis. Also started on plasmapheresis.   Assessment/Plan:  Principal Problem:   Acute glomerulonephritis Active Problems:   AKI (acute kidney injury) ([place])   Abnormal transaminases   Frequent headaches   Hypothyroidism   Hyponatremia   Sepsis ([place])   Metabolic encephalopathy   ANCA-associated vasculitis (HCC)    GI bleed Started having melanotic stools 2/16 Hemoglobin drop from 8.8-6.5 Started on IV PPI, as the patient is on prednisone, continue Protonix infusion Status post transfusion of 4 units, hemoglobin now 8.2 Notified Dr. Joelyn Oms about transfusing 2 -4 units , over course of last 2 days Appreciate  [facility] gastroenterology   evaluation EGD                 -  non-obstructing oozing duodenal ulcer with a                            visible vessel. Treatment not successful. Treated                            with bipolar cautery. Injected. Clip was placed.                           Transfer patient to step down                           -NPO pending further GI recommendations.                            Dr Oletta Lamas recommended general surgery consult ,which has been completed Will begin clear liquid diet, no further episodes since 2/17 am  Follow hemoglobin closely     Myeloperoxidase antibody positive ANCA  glomerulonephritis/AKI Patient's acute kidney injury appears to have developed over the last 1-2 months. His creatinine was 1.0 about a year ago. Nephrology was consulted. Autoimmune workup is positive for an elevated myeloperoxidase antibody. 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  Renal biopsy was performed 2/7. 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 Completed pulse steroids;   Pred 60 mg/day which was changed to iv solumedrol .  Patient started on cyclophosphamide which is now on hold  , continue  Bactrim for prophylaxis.   .  Being dialyzed based on renal function and  urine output . Slight increase in creatinine 4.4>4.9>4.63>4.54 .  Marland KitchenAlso, on plasmapheresis now starting 2/12.TPE 1.5PV x7, QOD Renal function stable, good uop k 5.8 , dr sanford notified , combination of steroids and bactrim    Sepsis of unclear source Culture data negative till date. Repeat chest x-ray is negative. Patient was initially started on the sepsis pathway. He was given vancomycin and Zosyn. Vancomycin was discontinued due  to renal failure. No clear infectious foci. MRI brain did not show any acute abnormalities. MRI abdomen did not show any intra-abdominal process. Zosyn was discontinued as well. Patient could've had a viral syndrome. MRI brain did show paranasal sinus disease and mastoid fluid. However patient has recently completed multiple courses of antibiotics for same. Continue to monitor off of antibiotics. Sepsis physiology, resolved. WBC is normal.   Metabolic encephalopathy Resolved. MRI brain did not show any acute process. Confusion could have been due to acute renal failure.   Elevated alkaline phosphatase Patient noted an elevated alkaline phosphatase. AST, ALT normal. Bilirubin normal. Abdominal ultrasound showed possible pancreatic mass. MRI did not show any lesion in the pancreas. Finding on the ultrasound was thought to be  artifactual from bowel. Outpatient follow-up for elevated alkaline phosphatase.  Elevated blood pressure Patient does not seem to be on any blood pressure lowering agents at home. Blood pressure could be elevated due to effect of steroid. Continue to monitor for now. If remains persistently elevated, may have to initiate antihypertensive agent.  Hyperglycemia No history of diabetes as outpatient. Hyperglycemia is likely due to steroids. Since he is now just on oral steroids, we decreased the dose of his insulin. HbA1c was 6.5. Continue to monitor CBGs and adjust dose of insulin as needed.   Hypothyroidism Continue Synthroid.  Hyponatremia Has improved after dialysis.   DVT Prophylaxis: SCDs  Code Status: Full code  Family Communication: Discussed with patient, wife and daughter Disposition Plan:  tx to stepdown    Consultants: Nephrology, gastroenterology  Procedures:  Renal biopsy 2/7  Placement of HD catheter 2/9  Echocardiogram Study Conclusions  - Left ventricle: The cavity size was normal. There was moderate   concentric hypertrophy. Systolic function was normal. The   estimated ejection fraction was in the range of 50% to 55%. Wall   motion was normal; there were no regional wall motion   abnormalities. Doppler parameters are consistent with abnormal   left ventricular relaxation (grade 1 diastolic dysfunction). - Aortic valve: Trileaflet; mildly thickened, mildly calcified   leaflets. - Aorta: Ascending aortic diameter: 42 mm (S). - Ascending aorta: The ascending aorta was mildly dilated. - Mitral valve: There was mild regurgitation. - Left atrium: The atrium was mildly dilated.  Hemodialysis  Plasmapheresis starting 2/12  Antibiotics: Initially started on vancomycin and Zosyn. Both of these antibiotics have been discontinued.  Subjective/Interval History: No bleeding overnight , standing and getting cleaned, not dizzy  ROS: Denies any chest pain  or shortness of breath  Objective:  Vital Signs  Vitals:   05/01/16 0351 05/01/16 0500 05/01/16 0600 05/01/16 0700  BP:  (!) 104/38 (!) 121/56 137/63  Pulse:  62 65 79  Resp:  17 12 11   Temp: 97.8 F (36.6 C)     TempSrc: Oral     SpO2:  100% 100% 96%  Weight:  82 kg (180 lb 12.4 oz)    Height:        Intake/Output Summary (Last 24 hours) at 05/01/16 0737 Last data filed at 05/01/16 0700  Gross per 24 hour  Intake            759.5 ml  Output             2450 ml  Net          -1690.5 ml   Filed Weights   04/29/16 1635 04/30/16 0500 05/01/16 0500  Weight: 84.3 kg (185 lb 13.6 oz) 84.1 kg (185 lb 6.5  oz) 82 kg (180 lb 12.4 oz)    General appearance: alert, cooperative, appears stated age and no distress Resp: clear to auscultation bilaterally Cardio: regular rate and rhythm, S1, S2 normal, no murmur, click, rub or gallop GI: soft, non-tender; bowel sounds normal; no masses,  no organomegaly Extremities: Does have some edema bilateral lower extremity Neurologic: Awake and alert. No focal deficits.  Lab Results:  Data Reviewed: I have personally reviewed following labs and imaging studies  CBC:  Recent Labs Lab 04/29/16 1639 04/29/16 2220 04/30/16 0451 04/30/16 1232 05/01/16 0209  WBC 10.3 9.2 10.3 11.5* 8.2  HGB 6.4* 7.1* 7.0* 9.0* 8.2*  HCT 19.1* 21.1* 21.3* 27.1* 24.1*  MCV 84.9 83.7 83.2 82.4 82.0  PLT 186 184 191 174 146*    Basic Metabolic Panel:  Recent Labs Lab 04/25/16 0511  04/26/16 0735 04/27/16 0450 04/27/16 1409 04/29/16 0629 04/30/16 0451 05/01/16 0209  NA 131*  < > 138 134* 134* 138 142 142  K 3.8  < > 4.2 4.3 4.6 4.5 4.9 5.8*  CL 97*  < > 104 100* 97* 110 114* 117*  CO2 22  --  23 23  --  19* 18* 19*  GLUCOSE 111*  < > 113* 124* 185* 112* 142* 145*  BUN 62*  < > 33* 43* 47* 71* 90* 93*  CREATININE 5.47*  < > 3.66* 4.40* 4.90* 4.88* 4.63* 4.54*  CALCIUM 7.3*  --  7.8* 7.5*  --  7.5* 7.9* 7.9*  PHOS 7.1*  --  5.2*  --   --   --    --   --   < > = values in this interval not displayed.  GFR: Estimated Creatinine Clearance: 13.4 mL/min (by C-G formula based on SCr of 4.54 mg/dL (H)).  Liver Function Tests:  Recent Labs Lab 04/25/16 0511 04/26/16 0735 04/29/16 0629 04/30/16 0451  AST  --   --  13* 12*  ALT  --   --  13* 15*  ALKPHOS  --   --  47 49  BILITOT  --   --  1.0 0.6  PROT  --   --  3.9* 4.3*  ALBUMIN 2.0* 3.6 2.8* 2.9*    CBG:  Recent Labs Lab 04/29/16 2259 04/30/16 0812 04/30/16 1225 04/30/16 1611 04/30/16 2210  GLUCAP 131* 111* 94 96 126*     Recent Results (from the past 240 hour(s))  MRSA PCR Screening     Status: None   Collection Time: 04/26/16  2:02 PM  Result Value Ref Range Status   MRSA by PCR NEGATIVE NEGATIVE Final    Comment:        The GeneXpert MRSA Assay (FDA approved for NASAL specimens only), is one component of a comprehensive MRSA colonization surveillance program. It is not intended to diagnose MRSA infection nor to guide or monitor treatment for MRSA infections.   MRSA PCR Screening     Status: None   Collection Time: 04/29/16  4:42 PM  Result Value Ref Range Status   MRSA by PCR NEGATIVE NEGATIVE Final    Comment:        The GeneXpert MRSA Assay (FDA approved for NASAL specimens only), is one component of a comprehensive MRSA colonization surveillance program. It is not intended to diagnose MRSA infection nor to guide or monitor treatment for MRSA infections.       Radiology Studies: No results found.   Medications:  Scheduled: . sodium chloride   Intravenous Once  .  sodium chloride   Intravenous Once  . sodium chloride   Intravenous Once  . gabapentin  100 mg Oral QHS  . insulin aspart  0-15 Units Subcutaneous TID WC  . insulin aspart  0-5 Units Subcutaneous QHS  . insulin aspart  3 Units Subcutaneous TID WC  . levothyroxine  100 mcg Intravenous Daily  . methylPREDNISolone (SOLU-MEDROL) injection  60 mg Intravenous Q24H  .  sulfamethoxazole-trimethoprim  1 tablet Oral Q M,W,F-1800   Continuous: . pantoprozole (PROTONIX) infusion 8 mg/hr (04/30/16 2358)   [id number] **OR** acetaminophen, ondansetron **OR** ondansetron (ZOFRAN) IV, traMADol, zolpidem      LOS: 15 days   [facility]  [facility] Pager [phone] 05/01/2016, 7:37 AM  If 7PM-7AM, please contact night-coverage at [email or web address], password [facility]

## 2016-05-01 NOTE — Progress Notes (Signed)
Patient stated that he would put his CPAP on when he is ready for bed. RT informed Pt to call if he needed assistance. RT will monitor as needed.

## 2016-05-01 NOTE — Progress Notes (Signed)
Pt wore his CPAP last night briefly. Pt self cares for his CPAP. Pt takes it off when he is ready

## 2016-05-01 NOTE — Progress Notes (Signed)
2 Days Post-Op  Subjective: No c/o. No abd pain. No n/v. Reports 1 stool yesterday pm - may have had some red in it. Got 2u prbc yesterday morning. No hypoTN. No tachycardia  Objective: Vital signs in last 24 hours: Temp:  [97.4 F (36.3 C)-98.6 F (37 C)] 97.4 F (36.3 C) (02/18 0816) Pulse Rate:  [59-79] 79 (02/18 0700) Resp:  [10-17] 11 (02/18 0700) BP: (104-147)/(38-79) 137/63 (02/18 0700) SpO2:  [96 %-100 %] 96 % (02/18 0700) Weight:  [82 kg (180 lb 12.4 oz)] 82 kg (180 lb 12.4 oz) (02/18 0500) Last BM Date: 04/30/16  Intake/Output from previous day: 02/17 0701 - 02/18 0700 In: 939.5 [I.V.:630; Blood:309.5] Out: 2450 [Urine:2450] Intake/Output this shift: No intake/output data recorded.  Alert, nontoxic, getting ready for breakfast cta  Soft, obese, nt, nd  Lab Results:   Recent Labs  04/30/16 1232 05/01/16 0209  WBC 11.5* 8.2  HGB 9.0* 8.2*  HCT 27.1* 24.1*  PLT 174 146*   BMET  Recent Labs  04/30/16 0451 05/01/16 0209  NA 142 142  K 4.9 5.8*  CL 114* 117*  CO2 18* 19*  GLUCOSE 142* 145*  BUN 90* 93*  CREATININE 4.63* 4.54*  CALCIUM 7.9* 7.9*   PT/INR No results for input(s): LABPROT, INR in the last 72 hours. ABG No results for input(s): PHART, HCO3 in the last 72 hours.  Invalid input(s): PCO2, PO2  Studies/Results: No results found.  Anti-infectives: Anti-infectives    Start     Dose/Rate Route Frequency Ordered Stop   04/25/16 1800  sulfamethoxazole-trimethoprim (BACTRIM DS,SEPTRA DS) 800-160 MG per tablet 1 tablet     1 tablet Oral Every M-W-F (1800) 04/23/16 1445     04/22/16 0926  ceFAZolin (ANCEF) 2-4 GM/100ML-% IVPB    Comments:  Leak, Brandi   : cabinet override      04/22/16 0926 04/22/16 0937   04/22/16 0715  ceFAZolin (ANCEF) IVPB 1 g/50 mL premix     1 g 100 mL/hr over 30 Minutes Intravenous To Radiology 04/22/16 0712 04/22/16 1012   04/19/16 1000  vancomycin (VANCOCIN) IVPB 1000 mg/200 mL premix  Status:  Discontinued      1,000 mg 200 mL/hr over 60 Minutes Intravenous Every 48 hours 04/17/16 0845 04/18/16 1654   04/17/16 1800  piperacillin-tazobactam (ZOSYN) IVPB 2.25 g  Status:  Discontinued     2.25 g 100 mL/hr over 30 Minutes Intravenous Every 8 hours 04/17/16 0845 04/20/16 1735   04/17/16 0815  piperacillin-tazobactam (ZOSYN) IVPB 2.25 g     2.25 g 100 mL/hr over 30 Minutes Intravenous  Once 04/17/16 0810 04/17/16 1058   04/17/16 0815  vancomycin (VANCOCIN) IVPB 1000 mg/200 mL premix     1,000 mg 200 mL/hr over 60 Minutes Intravenous  Once 04/17/16 0810 04/17/16 1122      Assessment/Plan: s/p Procedure(s): ESOPHAGOGASTRODUODENOSCOPY (EGD) ([name]) Bleeding DU - S/P upper endo with clip by Dr. Oletta Lamas. Small drop in hgb post transfusion from yesterday to this am.  Cont typical ulcer meds Consider checking coags Hold systemic anticoagulation due to recent bleed   Leighton Ruff. Redmond Pulling, MD, FACS General, Bariatric, & Minimally Invasive Surgery [facility], [place]   LOS: 15 days    Gayland Curry 05/01/2016

## 2016-05-01 NOTE — Progress Notes (Signed)
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 sodium chloride   Intravenous Once  . sodium chloride   Intravenous Once  . sodium chloride   Intravenous Once  . gabapentin  100 mg Oral QHS  . insulin aspart  0-15 Units Subcutaneous TID WC  . insulin aspart  0-5 Units Subcutaneous QHS  . insulin aspart  3 Units Subcutaneous TID WC  . levothyroxine  100 mcg Intravenous Daily  . methylPREDNISolone (SOLU-MEDROL) injection  60 mg Intravenous Q24H  . sulfamethoxazole-trimethoprim  1 tablet Oral Q M,W,F-1800   Continuous Infusions: . 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 MPO ANCA + 29.1 Neg: ANA, ASO, GBM, nl C3+C4  Physical Exam:  Blood pressure 137/63, pulse 79, temperature 97.8 F (36.6 C), temperature source Oral, resp. rate 11, height 5\' 5"  (1.651 m), weight 82 kg (180 lb 12.4 oz), SpO2 96 %. RRR R IJ TDC C/D/I CTAB S/nt, +BS quite active Trace LEE Nonfocal NCAT EOMI, glasses on  A 1. 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 If contributed to duodenal ulcer 1. 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 Minimal Scarring. 2. Completed pulse steroids; Now on solumedrol 60mg /d 3. PO Cytxoan 75 mg/day on hold given duodenal ulcer (would hold at least 1 wk to promote healing) --  will need to redose if off HD 4. TPE 1.5PV x7, QOD; Albumin colloid, started 2/12, 2/14; on hold currenlty given bleeding 5. Appears at this time to have sufficent GFR to not need HD 6. DS TMP/SMX TIW PCP prophylaxis 2. HLD 3. Gout 4. UGIB 04/29/15 2/2 duodenal ulcer 5. 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 Metabolic Acidosis 7. Hyperkalemia, mild, 2/2 reduced GFR, TMP/SMX, steroids  P 1. If Hb stable, tentative TPE tomorrow with 4u FFP as part of colloid replacement 2. Cont to hold cyclophosphamide as well, tentatively for 7d 3. Follow K after kayexalate, ok for furosemide as needed but volume status is relatively stable. If K remains problematic then will need to hold TMP/SMX as well.   4. HD not needed at current time 5. BMP this PM 6. Begin low dose NaHCO3 650 PO BID   Pearson Grippe MD 05/01/2016, 7:43 AM   Recent Labs Lab 04/25/16 0511  04/26/16 0735  04/29/16 0629 04/30/16 0451 05/01/16 0209  NA 131*  < > 138  < > 138 142 142  K 3.8  < > 4.2  < > 4.5 4.9 5.8*  CL 97*  < > 104  < > 110 114* 117*  CO2 22  --  23  < > 19* 18* 19*  GLUCOSE 111*  < > 113*  < > 112* 142* 145*  BUN 62*  < > 33*  < > 71* 90* 93*  CREATININE 5.47*  < >  3.66*  < > 4.88* 4.63* 4.54*  CALCIUM 7.3*  --  7.8*  < > 7.5* 7.9* 7.9*  PHOS 7.1*  --  5.2*  --   --   --   --   < > = values in this interval not displayed.  Recent Labs Lab 04/30/16 0451 04/30/16 1232 05/01/16 0209  WBC 10.3 11.5* 8.2  HGB 7.0* 9.0* 8.2*  HCT 21.3* 27.1* 24.1*  MCV 83.2 82.4 82.0  PLT 191 174 146*

## 2016-05-01 NOTE — Progress Notes (Addendum)
Nicholas Hudson 12:38 PM  Subjective: Patient doing well without signs of obvious bleeding and no bowel movements today and tolerating clear liquids and no new complaints  Objective: Vital signs stable afebrile no acute distress BUN and creatinine about the same hemoglobin slight decrease probably just reequilibration  Assessment: Multiple medical problems including small ulcer with bleed  Plan: If doing well in a.m. okay with me to advance diet and will last partner to check on tomorrow and can probably change pump inhibitor to oral tomorrow as well but avoid aspirin and nonsteroidals long-term  [facility]  Pager [phone] After 5PM or if no answer call [phone]

## 2016-05-02 ENCOUNTER — Encounter (HOSPITAL_COMMUNITY): Payer: Self-pay | Admitting: Gastroenterology

## 2016-05-02 LAB — GLUCOSE, CAPILLARY
GLUCOSE-CAPILLARY: 108 mg/dL — AB (ref 65–99)
GLUCOSE-CAPILLARY: 126 mg/dL — AB (ref 65–99)
GLUCOSE-CAPILLARY: 133 mg/dL — AB (ref 65–99)
Glucose-Capillary: 128 mg/dL — ABNORMAL HIGH (ref 65–99)

## 2016-05-02 LAB — CBC
HCT: 23.6 % — ABNORMAL LOW (ref 39.0–52.0)
HCT: 22.4 % — ABNORMAL LOW (ref 39.0–52.0)
HEMOGLOBIN: 7.5 g/dL — AB (ref 13.0–17.0)
Hemoglobin: 7.9 g/dL — ABNORMAL LOW (ref 13.0–17.0)
MCH: 27.4 pg (ref 26.0–34.0)
MCH: 27.6 pg (ref 26.0–34.0)
MCHC: 33.5 g/dL (ref 30.0–36.0)
MCHC: 33.5 g/dL (ref 30.0–36.0)
MCV: 81.9 fL (ref 78.0–100.0)
MCV: 82.4 fL (ref 78.0–100.0)
Platelets: 135 10*3/uL — ABNORMAL LOW (ref 150–400)
Platelets: 123 10*3/uL — ABNORMAL LOW (ref 150–400)
RBC: 2.88 MIL/uL — ABNORMAL LOW (ref 4.22–5.81)
RBC: 2.72 MIL/uL — AB (ref 4.22–5.81)
RDW: 17.2 % — AB (ref 11.5–15.5)
RDW: 17.5 % — ABNORMAL HIGH (ref 11.5–15.5)
WBC: 6.4 10*3/uL (ref 4.0–10.5)
WBC: 9.2 10*3/uL (ref 4.0–10.5)

## 2016-05-02 LAB — COMPREHENSIVE METABOLIC PANEL
ALBUMIN: 3 g/dL — AB (ref 3.5–5.0)
ALT: 15 U/L — ABNORMAL LOW (ref 17–63)
AST: 12 U/L — AB (ref 15–41)
Alkaline Phosphatase: 59 U/L (ref 38–126)
Anion gap: 8 (ref 5–15)
BILIRUBIN TOTAL: 0.7 mg/dL (ref 0.3–1.2)
BUN: 81 mg/dL — AB (ref 6–20)
CHLORIDE: 112 mmol/L — AB (ref 101–111)
CO2: 22 mmol/L (ref 22–32)
Calcium: 7.9 mg/dL — ABNORMAL LOW (ref 8.9–10.3)
Creatinine, Ser: 3.84 mg/dL — ABNORMAL HIGH (ref 0.61–1.24)
GFR calc Af Amer: 16 mL/min — ABNORMAL LOW (ref >60)
GFR calc non Af Amer: 14 mL/min — ABNORMAL LOW (ref >60)
GLUCOSE: 178 mg/dL — AB (ref 65–99)
POTASSIUM: 4.4 mmol/L (ref 3.5–5.1)
SODIUM: 142 mmol/L (ref 135–145)
TOTAL PROTEIN: 4.5 g/dL — AB (ref 6.5–8.1)

## 2016-05-02 LAB — PREPARE RBC (CROSSMATCH)

## 2016-05-02 LAB — RETICULOCYTES
RBC.: 2.72 MIL/uL — ABNORMAL LOW (ref 4.22–5.81)
RETIC CT PCT: 1.3 % (ref 0.4–3.1)
Retic Count, Absolute: 35.4 10*3/uL (ref 19.0–186.0)

## 2016-05-02 LAB — TROPONIN I: Troponin I: <0.03 ng/mL (ref <0.03)

## 2016-05-02 LAB — MAGNESIUM: Magnesium: 1.5 mg/dL — ABNORMAL LOW (ref 1.7–2.4)

## 2016-05-02 MED ORDER — METOPROLOL TARTRATE 12.5 MG HALF TABLET
12.5000 mg | ORAL_TABLET | Freq: Two times a day (BID) | ORAL | Status: DC
  Administered 2016-05-02 – 2016-05-06 (×8): 12.5 mg via ORAL
  Filled 2016-05-02 (×8): qty 1

## 2016-05-02 MED ORDER — SODIUM CHLORIDE 0.9 % IV SOLN
INTRAVENOUS | Status: AC
  Administered 2016-05-02 (×3): via INTRAVENOUS_CENTRAL
  Filled 2016-05-02 (×3): qty 200

## 2016-05-02 MED ORDER — ACD FORMULA A 0.73-2.45-2.2 GM/100ML VI SOLN
500.0000 mL | Status: DC
  Filled 2016-05-02: qty 500

## 2016-05-02 MED ORDER — HEPARIN SODIUM (PORCINE) 1000 UNIT/ML IJ SOLN: 1000.0000 [IU] | Freq: Once | INTRAMUSCULAR | Status: DC

## 2016-05-02 MED ORDER — PREDNISONE 20 MG PO TABS
60.0000 mg | ORAL_TABLET | Freq: Every day | ORAL | Status: DC
  Administered 2016-05-03 – 2016-05-13 (×11): 60 mg via ORAL
  Filled 2016-05-02 (×11): qty 3

## 2016-05-02 MED ORDER — SODIUM CHLORIDE 0.9 % IV SOLN: Freq: Once | INTRAVENOUS | Status: DC

## 2016-05-02 MED ORDER — CALCIUM CARBONATE ANTACID 500 MG PO CHEW
CHEWABLE_TABLET | ORAL | Status: AC
  Administered 2016-05-02: 400 mg via OROMUCOSAL
  Filled 2016-05-02: qty 2

## 2016-05-02 MED ORDER — PANTOPRAZOLE SODIUM 40 MG PO TBEC
40.0000 mg | DELAYED_RELEASE_TABLET | Freq: Two times a day (BID) | ORAL | Status: DC
  Administered 2016-05-02 – 2016-05-13 (×23): 40 mg via ORAL
  Filled 2016-05-02 (×23): qty 1

## 2016-05-02 MED ORDER — ACD FORMULA A 0.73-2.45-2.2 GM/100ML VI SOLN
Status: AC
  Filled 2016-05-02: qty 500

## 2016-05-02 MED ORDER — SODIUM CHLORIDE 0.9 % IV SOLN
Freq: Once | INTRAVENOUS | Status: DC
  Filled 2016-05-02: qty 200

## 2016-05-02 MED ORDER — MAGNESIUM SULFATE 2 GM/50ML IV SOLN
2.0000 g | Freq: Once | INTRAVENOUS | Status: AC
  Administered 2016-05-02: 2 g via INTRAVENOUS
  Filled 2016-05-02: qty 50

## 2016-05-02 MED ORDER — SODIUM CHLORIDE 0.9 % IV SOLN
4.0000 g | Freq: Once | INTRAVENOUS | Status: AC
  Administered 2016-05-02: 4 g via INTRAVENOUS
  Filled 2016-05-02 (×2): qty 40

## 2016-05-02 MED ORDER — DIPHENHYDRAMINE HCL 25 MG PO CAPS: 25.0000 mg | ORAL_CAPSULE | Freq: Four times a day (QID) | ORAL | Status: DC | PRN

## 2016-05-02 MED ORDER — ACETAMINOPHEN 325 MG PO TABS: 650.0000 mg | ORAL_TABLET | ORAL | Status: DC | PRN

## 2016-05-02 MED ORDER — HYDRALAZINE HCL 20 MG/ML IJ SOLN
5.0000 mg | Freq: Four times a day (QID) | INTRAMUSCULAR | Status: DC | PRN
  Administered 2016-05-08: 5 mg via INTRAVENOUS
  Filled 2016-05-02: qty 1

## 2016-05-02 NOTE — Progress Notes (Addendum)
[facility] PROGRESS NOTE  Nicholas Hudson [id number] DOB: 11/22/1940 DOA: 04/16/2016  PCP: Kandice Hams, MD  Brief History/Interval Summary: Nicholas Hudson is an 76 y.o. male with medical history significant of PBH, gout, recurrent sinus infection, hypothyroidism. Patient with history of sinus, polyps problems, infection for last 6 to 8 weeks, he has received 2 course of antibiotics by Dr Lucia Gaskins. Patient was found to have worsening renal function. He was hospitalized for further management. Patient seen by nephrology. Renal function continued to get worse. 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 Patient started on dialysis. Also started on plasmapheresis.   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 Joelyn Oms prior to all transfusions to monitor volume status  Appreciate  [facility]   evaluation EGD                 -  non-obstructing oozing duodenal ulcer with a                            visible vessel. Treatment not successful. Treated                            with bipolar cautery. Injected. Clip was placed.                                                  Dr Oletta Lamas recommended general surgery consult ,which has been completed Advance diet Hemoglobin dropped a bit from yesterday to today.  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 His creatinine was 1.0 about a year ago. Nephrology was consulted. Autoimmune workup is positive for an elevated myeloperoxidase antibody. 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  Renal biopsy was performed 2/7. 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 Completed pulse steroids;   Pred 60 mg/day which was changed to iv solumedrol .  Patient started on cyclophosphamide which is now on hold  , continue  Bactrim for prophylaxis.   .  Being dialyzed based on renal function and  urine output  Creatinine improving  4.4>4.9>4.63>4.54>3.84  Also, on plasmapheresis now starting 2/12.TPE 1.5PV x7, QOD Renal function stable, good uop k 5.8 , dr sanford notified , combination of steroids and bactrim, now resolved  with kayexalate  Off HD  For several days now   Sepsis of unclear source Culture data negative till date. Repeat chest x-ray is negative. Patient was initially started on the sepsis pathway. He was given vancomycin and Zosyn. Vancomycin was discontinued due to renal failure. No clear infectious foci. MRI brain did not show any acute abnormalities. MRI abdomen did not  show any intra-abdominal process. Zosyn was discontinued as well. Patient could've had a viral syndrome. MRI brain did show paranasal sinus disease and mastoid fluid. However patient has recently completed multiple courses of antibiotics for same. Continue to monitor off of antibiotics. Sepsis physiology, resolved. WBC is normal.   Metabolic encephalopathy Resolved. MRI brain did not show any acute process. Confusion could have been due to acute renal failure.   Elevated alkaline phosphatase Patient noted an elevated alkaline phosphatase. AST, ALT normal. Bilirubin normal. Abdominal ultrasound showed possible pancreatic mass. MRI did not show any lesion in the pancreas. Finding on the ultrasound was thought to be artifactual  from bowel. Outpatient follow-up for elevated alkaline phosphatase.  Elevated blood pressure Patient does not seem to be on any blood pressure lowering agents at home. Blood pressure could be elevated due to effect of steroid. Continue to monitor for now.Will start prn hydralazine as needed   Hyperglycemia No history of diabetes as outpatient. Hyperglycemia is likely due to steroids. Since he is now just on oral steroids, we decreased the dose of his insulin. HbA1c was 6.5. Continue to monitor CBGs and adjust dose of insulin as needed.   Hypothyroidism Continue Synthroid.  Hyponatremia Has improved after dialysis.   DVT Prophylaxis: SCDs  Code Status: Full code  Family Communication: Discussed with patient, wife and daughter Disposition Plan: continue tele,     Consultants: Nephrology, gastroenterology,gen surgery  Procedures:  Renal biopsy 2/7  Placement of HD catheter 2/9  Echocardiogram Study Conclusions  - Left ventricle: The cavity size was normal. There was moderate   concentric hypertrophy. Systolic function was normal. The   estimated ejection fraction was in the range of 50% to 55%. Wall   motion was normal; there were no regional wall motion   abnormalities. Doppler parameters are consistent with abnormal   left ventricular relaxation (grade 1 diastolic dysfunction). - Aortic valve: Trileaflet; mildly thickened, mildly calcified   leaflets. - Aorta: Ascending aortic diameter: 42 mm (S). - Ascending aorta: The ascending aorta was mildly dilated. - Mitral valve: There was mild regurgitation. - Left atrium: The atrium was mildly dilated.  Hemodialysis  Plasmapheresis starting 2/12  Antibiotics: Initially started on vancomycin and Zosyn. Both of these antibiotics have been discontinued.  Subjective/Interval History: Intermittent sinus tach with LBBB noted on tele   ROS: Denies any chest pain or shortness of breath  Objective:  Vital  Signs  Vitals:   05/02/16 0419 05/02/16 0800 05/02/16 1150 05/02/16 1155  BP: (!) 160/91 (!) 155/78 (!) 142/81   Pulse: 83 73 64 84  Resp: 18 20 17    Temp: 97.9 F (36.6 C) 97.7 F (36.5 C) 97.8 F (36.6 C)   TempSrc: Oral Oral Oral   SpO2: 98% 97% 99% 98%  Weight: 80.5 kg (177 lb 6.4 oz)     Height:        Intake/Output Summary (Last 24 hours) at 05/02/16 1332 Last data filed at 05/02/16 1322  Gross per 24 hour  Intake             1110 ml  Output             1950 ml  Net             -840 ml   Filed Weights   04/30/16 0500 05/01/16 0500 05/02/16 0419  Weight: 84.1 kg (185 lb 6.5 oz) 82 kg (180 lb 12.4 oz) 80.5 kg (177 lb 6.4 oz)    General appearance: alert,  cooperative, appears stated age and no distress Resp: clear to auscultation bilaterally Cardio: regular rate and rhythm, S1, S2 normal, no murmur, click, rub or gallop GI: soft, non-tender; bowel sounds normal; no masses,  no organomegaly Extremities: Does have some edema bilateral lower extremity Neurologic: Awake and alert. No focal deficits.  Lab Results:  Data Reviewed: I have personally reviewed following labs and imaging studies  CBC:  Recent Labs Lab 04/29/16 2220 04/30/16 0451 04/30/16 1232 05/01/16 0209 05/02/16 0410  WBC 9.2 10.3 11.5* 8.2 6.4  HGB 7.1* 7.0* 9.0* 8.2* 7.9*  HCT 21.1* 21.3* 27.1* 24.1* 23.6*  MCV 83.7 83.2 82.4 82.0 81.9  PLT 184 191 174 146* 135*    Basic Metabolic Panel:  Recent Labs Lab 04/26/16 0735  04/29/16 0629 04/30/16 0451 05/01/16 0209 05/01/16 0824 05/01/16 1634 05/02/16 0410 05/02/16 0952  NA 138  < > 138 142 142  --  143 142  --   K 4.2  < > 4.5 4.9 5.8* 5.0 4.4 4.4  --   CL 104  < > 110 114* 117*  --  111 112*  --   CO2 23  < > 19* 18* 19*  --  20* 22  --   GLUCOSE 113*  < > 112* 142* 145*  --  193* 178*  --   BUN 33*  < > 71* 90* 93*  --  85* 81*  --   CREATININE 3.66*  < > 4.88* 4.63* 4.54*  --  4.17* 3.84*  --   CALCIUM 7.8*  < > 7.5* 7.9* 7.9*   --  7.7* 7.9*  --   MG  --   --   --   --   --   --   --   --  1.5*  PHOS 5.2*  --   --   --   --   --   --   --   --   < > = values in this interval not displayed.  GFR: Estimated Creatinine Clearance: 15.7 mL/min (by C-G formula based on SCr of 3.84 mg/dL (H)).  Liver Function Tests:  Recent Labs Lab 04/26/16 0735 04/29/16 0629 04/30/16 0451 05/02/16 0410  AST  --  13* 12* 12*  ALT  --  13* 15* 15*  ALKPHOS  --  47 49 59  BILITOT  --  1.0 0.6 0.7  PROT  --  3.9* 4.3* 4.5*  ALBUMIN 3.6 2.8* 2.9* 3.0*    CBG:  Recent Labs Lab 05/01/16 1216 05/01/16 1630 05/01/16 2228 05/02/16 0729 05/02/16 1149  GLUCAP 110* 170* 122* 128* 108*     Recent Results (from the past 240 hour(s))  MRSA PCR Screening     Status: None   Collection Time: 04/26/16  2:02 PM  Result Value Ref Range Status   MRSA by PCR NEGATIVE NEGATIVE Final    Comment:        The GeneXpert MRSA Assay (FDA approved for NASAL specimens only), is one component of a comprehensive MRSA colonization surveillance program. It is not intended to diagnose MRSA infection nor to guide or monitor treatment for MRSA infections.   MRSA PCR Screening     Status: None   Collection Time: 04/29/16  4:42 PM  Result Value Ref Range Status   MRSA by PCR NEGATIVE NEGATIVE Final    Comment:        The GeneXpert MRSA Assay (FDA approved for NASAL specimens only), is one component of a comprehensive MRSA  colonization surveillance program. It is not intended to diagnose MRSA infection nor to guide or monitor treatment for MRSA infections.       Radiology Studies: No results found.   Medications:  Scheduled: . sodium chloride   Intravenous Once  . sodium chloride   Intravenous Once  . sodium chloride   Intravenous Once  . sodium chloride   Intravenous Once  . sodium chloride   Intravenous Once  . therapeutic plasma exchange solution   Dialysis Q1H  . calcium gluconate IVPB  4 g Intravenous Once  .  gabapentin  100 mg Oral QHS  . heparin  1,000 Units Intracatheter Once  . insulin aspart  0-15 Units Subcutaneous TID WC  . insulin aspart  0-5 Units Subcutaneous QHS  . insulin aspart  3 Units Subcutaneous TID WC  . levothyroxine  100 mcg Intravenous Daily  . metoprolol tartrate  12.5 mg Oral BID  . pantoprazole  40 mg Oral BID  . [START ON 05/03/2016] predniSONE  60 mg Oral Q breakfast  . sodium bicarbonate  650 mg Oral BID  . sulfamethoxazole-trimethoprim  1 tablet Oral Q M,W,F-1800   Continuous: . citrate dextrose    . pantoprozole (PROTONIX) infusion 8 mg/hr (05/01/16 2134)   [id number] **OR** acetaminophen, acetaminophen, diphenhydrAMINE, ondansetron **OR** ondansetron (ZOFRAN) IV, traMADol, zolpidem      LOS: 16 days   [facility]  [facility] Pager [phone] 05/02/2016, 1:32 PM  If 7PM-7AM, please contact night-coverage at [email or web address], password [email or web address]

## 2016-05-02 NOTE — Progress Notes (Signed)
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 Says had small brown BM overnight, no blood In good spirits  02/18 0701 - 02/19 0700 In: 1045 [P.O.:720; I.V.:325] Out: 1200 [Urine:1200]  Filed Weights   04/30/16 0500 05/01/16 0500 05/02/16 0419  Weight: 84.1 kg (185 lb 6.5 oz) 82 kg (180 lb 12.4 oz) 80.5 kg (177 lb 6.4 oz)    Scheduled Meds: . sodium chloride   Intravenous Once  . sodium chloride   Intravenous Once  . sodium chloride   Intravenous Once  . gabapentin  100 mg Oral QHS  . insulin aspart  0-15 Units Subcutaneous TID WC  . insulin aspart  0-5 Units Subcutaneous QHS  . insulin aspart  3 Units Subcutaneous TID WC  . levothyroxine  100 mcg Intravenous Daily  . magnesium sulfate 1 - 4 g bolus IVPB  2 g Intravenous Once  . methylPREDNISolone (SOLU-MEDROL) injection  60 mg Intravenous Q24H  . metoprolol tartrate  12.5 mg Oral BID  . sodium bicarbonate  650 mg Oral BID  . sulfamethoxazole-trimethoprim  1 tablet Oral Q M,W,F-1800   Continuous Infusions: . 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 MPO ANCA + 29.1 Neg: ANA, ASO, GBM, nl C3+C4  Physical Exam:  Blood pressure (!) 155/78, pulse 73, temperature 97.7 F (36.5 C), temperature source Oral, resp. rate 20, height '5\' 5"'  (1.651 m), weight 80.5 kg (177 lb 6.4 oz), SpO2 97 %. RRR R IJ TDC C/D/I CTAB S/nt, +BS quite active Trace LEE Nonfocal NCAT EOMI, glasses on  A 1. 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 If contributed to duodenal ulcer 1. 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  Minimal Scarring. 2. Completed pulse steroids; Now on pred 67m/d 3. PO Cytxoan 75 mg/day on hold given duodenal ulcer (would hold at least 1 wk to promote healing) -- will need to redose now thatoff HD based on eGFR 4. TPE 1.5PV x7, QOD; Albumin colloid, started 2/12, 2/14; resuming 2/19 5. DS TMP/SMX TIW PCP prophylaxis 2. HLD 3. Gout 4. UGIB 04/29/15 2/2 duodenal ulcer on PPI s/p clipping at EGD 5. 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 Metabolic Acidosis 7. Hyperkalemia, mild, 2/2 reduced GFR, TMP/SMX, steroids  P 1. TTE today with 4u FFP as colloid replacement 2. Cont to hold cyclophosphamide as well, resume mid week 3. Cont low dose NaHCO3 650 PO BID   RPearson GrippeMD 05/02/2016, 9:13 AM   Recent Labs Lab 04/26/16 0735  05/01/16 0209 05/01/16 0824 05/01/16 1634 05/02/16 0410  NA 138  < > 142  --  143 142  K 4.2  < > 5.8* 5.0 4.4 4.4  CL 104  < > 117*  --  111 112*  CO2 23  < > 19*  --  20* 22  GLUCOSE 113*  < > 145*  --  193* 178*  BUN 33*  < > 93*  --  85* 81*  CREATININE 3.66*  < > 4.54*  --  4.17* 3.84*  CALCIUM 7.8*  < > 7.9*  --  7.7* 7.9*  PHOS 5.2*  --   --   --   --   --   < > =  values in this interval not displayed.  Recent Labs Lab 04/30/16 1232 05/01/16 0209 05/02/16 0410  WBC 11.5* 8.2 6.4  HGB 9.0* 8.2* 7.9*  HCT 27.1* 24.1* 23.6*  MCV 82.4 82.0 81.9  PLT 174 146* 135*

## 2016-05-02 NOTE — Progress Notes (Signed)
[facility] Progress Note  Subjective: Okay, apparently having some heart palpitations or arrhythmias. Last stool yesterday morning as described as small and  Brown. Patient still on liquids.  Objective: Vital signs in last 24 hours: Temp:  [97.5 F (36.4 C)-98.2 F (36.8 C)] 97.7 F (36.5 C) (02/19 0800) Pulse Rate:  [67-86] 73 (02/19 0800) Resp:  [13-20] 20 (02/19 0800) BP: (137-160)/(58-91) 155/78 (02/19 0800) SpO2:  [97 %-100 %] 97 % (02/19 0800) Weight:  [80.5 kg (177 lb 6.4 oz)] 80.5 kg (177 lb 6.4 oz) (02/19 0419) Weight change: -1.532 kg (-3 lb 6 oz)   PE: Unchanged  Lab Results: Results for orders placed or performed during the hospital encounter of 04/16/16 (from the past 24 hour(s))  Glucose, capillary     Status: Abnormal   Collection Time: 05/01/16 12:16 PM  Result Value Ref Range   Glucose-Capillary 110 (H) 65 - 99 mg/dL   Comment 1 Notify RN    Comment 2 Document in Chart   Glucose, capillary     Status: Abnormal   Collection Time: 05/01/16  4:30 PM  Result Value Ref Range   Glucose-Capillary 170 (H) 65 - 99 mg/dL  Basic metabolic panel     Status: Abnormal   Collection Time: 05/01/16  4:34 PM  Result Value Ref Range   Sodium 143 135 - 145 mmol/L   Potassium 4.4 3.5 - 5.1 mmol/L   Chloride 111 101 - 111 mmol/L   CO2 20 (L) 22 - 32 mmol/L   Glucose, Bld 193 (H) 65 - 99 mg/dL   BUN 85 (H) 6 - 20 mg/dL   Creatinine, Ser 4.17 (H) 0.61 - 1.24 mg/dL   Calcium 7.7 (L) 8.9 - 10.3 mg/dL   GFR calc non Af Amer 13 (L) >60 mL/min   GFR calc Af Amer 15 (L) >60 mL/min   Anion gap 12 5 - 15  Glucose, capillary     Status: Abnormal   Collection Time: 05/01/16 10:28 PM  Result Value Ref Range   Glucose-Capillary 122 (H) 65 - 99 mg/dL  CBC     Status: Abnormal   Collection Time: 05/02/16  4:10 AM  Result Value Ref Range   WBC 6.4 4.0 - 10.5 K/uL   RBC 2.88 (L) 4.22 - 5.81 MIL/uL   Hemoglobin 7.9 (L) 13.0 - 17.0 g/dL   HCT 23.6 (L) 39.0 - 52.0 %   MCV  81.9 78.0 - 100.0 fL   MCH 27.4 26.0 - 34.0 pg   MCHC 33.5 30.0 - 36.0 g/dL   RDW 17.2 (H) 11.5 - 15.5 %   Platelets 135 (L) 150 - 400 K/uL  Comprehensive metabolic panel     Status: Abnormal   Collection Time: 05/02/16  4:10 AM  Result Value Ref Range   Sodium 142 135 - 145 mmol/L   Potassium 4.4 3.5 - 5.1 mmol/L   Chloride 112 (H) 101 - 111 mmol/L   CO2 22 22 - 32 mmol/L   Glucose, Bld 178 (H) 65 - 99 mg/dL   BUN 81 (H) 6 - 20 mg/dL   Creatinine, Ser 3.84 (H) 0.61 - 1.24 mg/dL   Calcium 7.9 (L) 8.9 - 10.3 mg/dL   Total Protein 4.5 (L) 6.5 - 8.1 g/dL   Albumin 3.0 (L) 3.5 - 5.0 g/dL   AST 12 (L) 15 - 41 U/L   ALT 15 (L) 17 - 63 U/L   Alkaline Phosphatase 59 38 - 126 U/L   Total Bilirubin 0.7  0.3 - 1.2 mg/dL   GFR calc non Af Amer 14 (L) >60 mL/min   GFR calc Af Amer 16 (L) >60 mL/min   Anion gap 8 5 - 15  Glucose, capillary     Status: Abnormal   Collection Time: 05/02/16  7:29 AM  Result Value Ref Range   Glucose-Capillary 128 (H) 65 - 99 mg/dL    Studies/Results: No results found.    Assessment: Duodenal ulcer status post bleed with endoscopic clipping appears to have stopped. Hemoglobin still slightly low 7.9  Plan: Advance diet Continue PPI for minimum of 2 months. Consider 1 unit packed red blood cells Will follow-up tomorrow but probably close to being dischargable from GI standpoint.    Sagrario Lineberry C 05/02/2016, 8:42 AM  Pager [phone] If no answer or after 5 PM call [phone]

## 2016-05-02 NOTE — Progress Notes (Signed)
[facility] Progress Note 3 Days Post-Op  Subjective: Patient sitting on the portable toilet, had bowel movement last night which was not bloody.  Objective: Vital signs in last 24 hours: Temp:  [97.5 F (36.4 C)-98.2 F (36.8 C)] 97.7 F (36.5 C) (02/19 0800) Pulse Rate:  [67-86] 73 (02/19 0800) Resp:  [15-20] 20 (02/19 0800) BP: (137-160)/(63-91) 155/78 (02/19 0800) SpO2:  [97 %-100 %] 97 % (02/19 0800) Weight:  [80.5 kg (177 lb 6.4 oz)] 80.5 kg (177 lb 6.4 oz) (02/19 0419) Last BM Date: 04/30/16  Intake/Output from previous day: 02/18 0701 - 02/19 0700 In: 1045 [P.O.:720; I.V.:325] Out: 1200 [Urine:1200] Intake/Output this shift: Total I/O In: -  Out: 350 [Urine:350]  General: No acute distress.  A bit anxious.  HR 100. BP good.  Lungs: Clear  Abd: Soft, good bowel sounds.  Not tender.  Extremities: No changes  Neuro: Intact  Lab Results:  @LABLAST2 (wbc:2,hgb:2,hct:2,plt:2) BMET ) Recent Labs  05/01/16 1634 05/02/16 0410  NA 143 142  K 4.4 4.4  CL 111 112*  CO2 20* 22  GLUCOSE 193* 178*  BUN 85* 81*  CREATININE 4.17* 3.84*  CALCIUM 7.7* 7.9*   PT/INR No results for input(s): LABPROT, INR in the last 72 hours. ABG No results for input(s): PHART, HCO3 in the last 72 hours.  Invalid input(s): PCO2, PO2  Studies/Results: No results found.  Anti-infectives: Anti-infectives    Start     Dose/Rate Route Frequency Ordered Stop   04/25/16 1800  sulfamethoxazole-trimethoprim (BACTRIM DS,SEPTRA DS) 800-160 MG per tablet 1 tablet     1 tablet Oral Every M-W-F (1800) 04/23/16 1445     04/22/16 0926  ceFAZolin (ANCEF) 2-4 GM/100ML-% IVPB    Comments:  Leak, Brandi   : cabinet override      04/22/16 0926 04/22/16 0937   04/22/16 0715  ceFAZolin (ANCEF) IVPB 1 g/50 mL premix     1 g 100 mL/hr over 30 Minutes Intravenous To Radiology 04/22/16 0712 04/22/16 1012   04/19/16 1000  vancomycin (VANCOCIN) IVPB 1000 mg/200 mL premix  Status:  Discontinued     1,000 mg 200 mL/hr over 60 Minutes Intravenous Every 48 hours 04/17/16 0845 04/18/16 1654   04/17/16 1800  piperacillin-tazobactam (ZOSYN) IVPB 2.25 g  Status:  Discontinued     2.25 g 100 mL/hr over 30 Minutes Intravenous Every 8 hours 04/17/16 0845 04/20/16 1735   04/17/16 0815  piperacillin-tazobactam (ZOSYN) IVPB 2.25 g     2.25 g 100 mL/hr over 30 Minutes Intravenous  Once 04/17/16 0810 04/17/16 1058   04/17/16 0815  vancomycin (VANCOCIN) IVPB 1000 mg/200 mL premix     1,000 mg 200 mL/hr over 60 Minutes Intravenous  Once 04/17/16 0810 04/17/16 1122      Assessment/Plan: s/p Procedure(s): ESOPHAGOGASTRODUODENOSCOPY (EGD) Hemoglobin dropped a bit from yesterday to today.  8.2 to 7.9  Platelets also dropped a bit to 135K It does not seem as though he is actively bleeding, but still equilibrating. We will continue to follow the patient until his hemoglobin stabilizes and platelets start to go up.  LOS: 16 days   Kathryne Eriksson. Dahlia Bailiff, MD, FACS [phone] [facility] Surgery 05/02/2016

## 2016-05-02 NOTE — Care Management Important Message (Signed)
Important Message  Patient Details  Name: Nicholas Hudson MRN: [id number] Date of Birth: Aug 17, 1940   Medicare Important Message Given:  Yes    Nathen May 05/02/2016, 12:18 PM

## 2016-05-02 NOTE — Progress Notes (Signed)
Physical Therapy Treatment Patient Details Name: Nicholas Hudson MRN: [id number] DOB: 04/27/1940 Today's Date: 05/02/2016    History of Present Illness 76 y.o. male with medical history significant of PBH, gout, recurrent sinus infection, hypothyroidism. Patient with history of sinus, polyps  problems, infection for last 6 to 8 weeks. pt with increased lethargy and confusion, renal function with mild leukocytosis and glomerulonephritis. HD initiated 04-22-16.    PT Comments    Pt very pleasant, moving well and eager to return to baseline independent level and working. Pt with continued minor balance deficits with need for continued RW use and will continue to work toward independent function with therapy. Pt educated for stairs and encouraged mobility. Will continue to follow.   VSS throughout    Follow Up Recommendations  No PT follow up;Supervision for mobility/OOB     Equipment Recommendations  None recommended by PT    Recommendations for Other Services       Precautions / Restrictions Precautions Precautions: Fall Restrictions Weight Bearing Restrictions: No    Mobility  Bed Mobility Overal bed mobility: Modified Independent                Transfers Overall transfer level: Modified independent                  Ambulation/Gait Ambulation/Gait assistance: Supervision Ambulation Distance (Feet): 650 Feet Assistive device: Rolling walker (2 wheeled) Gait Pattern/deviations: Step-through pattern;Decreased stride length;Trunk flexed   Gait velocity interpretation: Below normal speed for age/gender General Gait Details: cues to maintain self in the RW. Attempted gait without RW with pt with short steps and unsteady gait, not yet safe without DME but will continue to assess   Stairs Stairs: Yes   Stair Management: Backwards;With walker Number of Stairs: 1 General stair comments: cues for sequence with pt able to return demonstrate  Wheelchair  Mobility    Modified Rankin (Stroke Patients Only)       Balance Overall balance assessment: Needs assistance   Sitting balance-Leahy Scale: Good       Standing balance-Leahy Scale: Fair                      Cognition Arousal/Alertness: Awake/alert Behavior During Therapy: WFL for tasks assessed/performed Overall Cognitive Status: Within Functional Limits for tasks assessed                      Exercises      General Comments        Pertinent Vitals/Pain Pain Assessment: No/denies pain    Home Living                      Prior Function            PT Goals (current goals can now be found in the care plan section) Progress towards PT goals: Progressing toward goals    Frequency           PT Plan Current plan remains appropriate    Co-evaluation             End of Session Equipment Utilized During Treatment: Gait belt Activity Tolerance: Patient tolerated treatment well Patient left: in chair;with call bell/phone within reach;with family/visitor present Nurse Communication: Mobility status PT Visit Diagnosis: Difficulty in walking, not elsewhere classified (R26.2)     Time: [id number] PT Time Calculation (min) (ACUTE ONLY): 23 min  Charges:  $Gait Training: 23-37 mins  G Codes:       Sandy Salaam Marqueta Pulley 30-May-2016, 11:57 AM  Elwyn Reach, [place]

## 2016-05-03 ENCOUNTER — Encounter (HOSPITAL_COMMUNITY): Payer: Self-pay

## 2016-05-03 LAB — TYPE AND SCREEN
ABO/RH(D): A POS
ANTIBODY SCREEN: NEGATIVE
UNIT DIVISION: 0
UNIT DIVISION: 0
UNIT DIVISION: 0
Unit division: 0
Unit division: 0
Unit division: 0

## 2016-05-03 LAB — BASIC METABOLIC PANEL
Anion gap: 8 (ref 5–15)
BUN: 79 mg/dL — AB (ref 6–20)
CALCIUM: 8 mg/dL — AB (ref 8.9–10.3)
CO2: 22 mmol/L (ref 22–32)
Chloride: 113 mmol/L — ABNORMAL HIGH (ref 101–111)
Creatinine, Ser: 3.46 mg/dL — ABNORMAL HIGH (ref 0.61–1.24)
GFR calc Af Amer: 18 mL/min — ABNORMAL LOW (ref >60)
GFR, EST NON AFRICAN AMERICAN: 16 mL/min — AB (ref >60)
GLUCOSE: 95 mg/dL (ref 65–99)
Potassium: 4.1 mmol/L (ref 3.5–5.1)
Sodium: 143 mmol/L (ref 135–145)

## 2016-05-03 LAB — GLUCOSE, CAPILLARY
GLUCOSE-CAPILLARY: 129 mg/dL — AB (ref 65–99)
Glucose-Capillary: 92 mg/dL (ref 65–99)
Glucose-Capillary: 106 mg/dL — ABNORMAL HIGH (ref 65–99)
Glucose-Capillary: 187 mg/dL — ABNORMAL HIGH (ref 65–99)

## 2016-05-03 LAB — CBC
HCT: 23 % — ABNORMAL LOW (ref 39.0–52.0)
Hemoglobin: 7.7 g/dL — ABNORMAL LOW (ref 13.0–17.0)
MCH: 27.7 pg (ref 26.0–34.0)
MCHC: 33.5 g/dL (ref 30.0–36.0)
MCV: 82.7 fL (ref 78.0–100.0)
PLATELETS: 124 10*3/uL — AB (ref 150–400)
RBC: 2.78 MIL/uL — ABNORMAL LOW (ref 4.22–5.81)
RDW: 16.4 % — AB (ref 11.5–15.5)
WBC: 9.5 10*3/uL (ref 4.0–10.5)

## 2016-05-03 LAB — POCT I-STAT, CHEM 8
BUN: 85 mg/dL — AB (ref 6–20)
CHLORIDE: 109 mmol/L (ref 101–111)
Calcium, Ion: 1.15 mmol/L (ref 1.15–1.40)
Creatinine, Ser: 3.5 mg/dL — ABNORMAL HIGH (ref 0.61–1.24)
Glucose, Bld: 91 mg/dL (ref 65–99)
HEMATOCRIT: 18 % — AB (ref 39.0–52.0)
Hemoglobin: 6.1 g/dL — CL (ref 13.0–17.0)
POTASSIUM: 4 mmol/L (ref 3.5–5.1)
SODIUM: 144 mmol/L (ref 135–145)
TCO2: 21 mmol/L (ref 0–100)

## 2016-05-03 LAB — PREPARE FRESH FROZEN PLASMA
UNIT DIVISION: 0
Unit division: 0
Unit division: 0
Unit division: 0
Unit division: 0

## 2016-05-03 LAB — PREPARE RBC (CROSSMATCH)

## 2016-05-03 LAB — MAGNESIUM: MAGNESIUM: 1.7 mg/dL (ref 1.7–2.4)

## 2016-05-03 LAB — PROTIME-INR
INR: 2.14
PROTHROMBIN TIME: 24.3 s — AB (ref 11.4–15.2)

## 2016-05-03 MED ORDER — CALCIUM CARBONATE ANTACID 500 MG PO CHEW
2.0000 | CHEWABLE_TABLET | ORAL | Status: AC
  Administered 2016-05-03: 400 mg via ORAL

## 2016-05-03 MED ORDER — DIPHENHYDRAMINE HCL 25 MG PO CAPS: 25.0000 mg | ORAL_CAPSULE | Freq: Four times a day (QID) | ORAL | Status: DC | PRN

## 2016-05-03 MED ORDER — ACD FORMULA A 0.73-2.45-2.2 GM/100ML VI SOLN
500.0000 mL | Status: DC
  Administered 2016-05-03: 500 mL via INTRAVENOUS
  Filled 2016-05-03: qty 500

## 2016-05-03 MED ORDER — SODIUM CHLORIDE 0.9 % IV SOLN: Freq: Once | INTRAVENOUS | Status: AC

## 2016-05-03 MED ORDER — POLYETHYLENE GLYCOL 3350 17 G PO PACK
17.0000 g | PACK | Freq: Two times a day (BID) | ORAL | Status: DC
  Administered 2016-05-03 – 2016-05-05 (×3): 17 g via ORAL
  Filled 2016-05-03 (×5): qty 1

## 2016-05-03 MED ORDER — SODIUM CHLORIDE 0.9 % IV SOLN: Freq: Once | INTRAVENOUS | Status: DC

## 2016-05-03 MED ORDER — ACD FORMULA A 0.73-2.45-2.2 GM/100ML VI SOLN
Status: AC
  Administered 2016-05-03: 500 mL via INTRAVENOUS
  Filled 2016-05-03: qty 500

## 2016-05-03 MED ORDER — SODIUM CHLORIDE 0.9 % IV SOLN
INTRAVENOUS | Status: AC
  Administered 2016-05-03 (×5): via INTRAVENOUS_CENTRAL
  Filled 2016-05-03 (×5): qty 200

## 2016-05-03 MED ORDER — CALCIUM GLUCONATE 10 % IV SOLN
4.0000 g | Freq: Once | INTRAVENOUS | Status: AC
  Administered 2016-05-03: 4 g via INTRAVENOUS
  Filled 2016-05-03: qty 40

## 2016-05-03 MED ORDER — ACD FORMULA A 0.73-2.45-2.2 GM/100ML VI SOLN
Status: AC
  Filled 2016-05-03: qty 500

## 2016-05-03 MED ORDER — CALCIUM CARBONATE ANTACID 500 MG PO CHEW
CHEWABLE_TABLET | ORAL | Status: AC
Start: 2016-05-03 — End: 2016-05-03
  Filled 2016-05-03: qty 2

## 2016-05-03 MED ORDER — ANTICOAGULANT SODIUM CITRATE 4% (200MG/5ML) IV SOLN
5.0000 mL | Freq: Once | Status: AC
  Filled 2016-05-03: qty 250

## 2016-05-03 MED ORDER — ACETAMINOPHEN 325 MG PO TABS: 650.0000 mg | ORAL_TABLET | ORAL | Status: DC | PRN

## 2016-05-03 MED ORDER — LEVOTHYROXINE SODIUM 100 MCG PO TABS
200.0000 ug | ORAL_TABLET | Freq: Every day | ORAL | Status: DC
  Administered 2016-05-03 – 2016-05-11 (×9): 200 ug via ORAL
  Filled 2016-05-03 (×10): qty 2

## 2016-05-03 NOTE — Care Management Note (Signed)
Case Management Note  Patient Details  Name: Nicholas Hudson MRN: [id number] Date of Birth: December 15, 1940  Subjective/Objective:  Acute Glomerulonephritis, AKI, Sepsis                  Action/Plan: Discharge Planning: NCM spoke to pt and wife at bedside. Pt is requesting RW and 3n1 bedside commode for home. Will order on 05/04/2016 from [facility]. Pt was independent prior to hospital stay. Wife at home to assist with care. Will continue to follow for dc needs.   PCP POLITE, RONALD   Expected Discharge Date:  04/25/16               Expected Discharge Plan:  Home/Self Care  In-House Referral:  NA  Discharge planning Services  NA  Post Acute Care Choice:  NA Choice offered to:  NA  DME Arranged:  [name] DME Agency:     HH Arranged:    HH Agency:     Status of Service:  In process, will continue to follow  If discussed at Long Length of Stay Meetings, dates discussed:    Additional Comments:  Erenest Rasher, RN 05/03/2016, 5:26 PM

## 2016-05-03 NOTE — Procedures (Signed)
I was present at this Plasmapheresis session (TPE). I have reviewed the session itself and made appropriate changes. This is now his 4th out of 7 treatments of every other day TPE with albumin.  He is tolerating it well and is without complaints.   Filed Weights   05/01/16 0500 05/02/16 0419 05/03/16 0500  Weight: 82 kg (180 lb 12.4 oz) 80.5 kg (177 lb 6.4 oz) 81.3 kg (179 lb 3.2 oz)     Recent Labs Lab 05/03/16 0758  NA 143  K 4.1  CL 113*  CO2 22  GLUCOSE 95  BUN 79*  CREATININE 3.46*  CALCIUM 8.0*     Recent Labs Lab 05/02/16 0410 05/02/16 1515 05/03/16 0758  WBC 6.4 9.2 9.5  HGB 7.9* 7.5* 7.7*  HCT 23.6* 22.4* 23.0*  MCV 81.9 82.4 82.7  PLT 135* 123* 124*    Scheduled Meds: . calcium carbonate      . sodium chloride   Intravenous Once  . therapeutic plasma exchange solution   Dialysis Q1 Hr x 5  . anticoagulant sodium citrate  5 mL Intracatheter Once  . calcium carbonate  2 tablet Oral Q3H  . calcium gluconate IVPB  4 g Intravenous Once  . gabapentin  100 mg Oral QHS  . insulin aspart  0-15 Units Subcutaneous TID WC  . insulin aspart  0-5 Units Subcutaneous QHS  . insulin aspart  3 Units Subcutaneous TID WC  . levothyroxine  100 mcg Intravenous Daily  . metoprolol tartrate  12.5 mg Oral BID  . pantoprazole  40 mg Oral BID  . polyethylene glycol  17 g Oral BID  . predniSONE  60 mg Oral Q breakfast  . sodium bicarbonate  650 mg Oral BID  . sulfamethoxazole-trimethoprim  1 tablet Oral Q M,W,F-1800   Continuous Infusions: . citrate dextrose 500 mL (05/03/16 0858)   PRN Meds:.acetaminophen **OR** acetaminophen, acetaminophen, diphenhydrAMINE, hydrALAZINE, ondansetron **OR** ondansetron (ZOFRAN) IV, traMADol, zolpidem   Donetta Potts,  MD 05/03/2016, 9:06 AM

## 2016-05-03 NOTE — Progress Notes (Signed)
Pt FFP administration was charted under therapeutic plasma exchange.

## 2016-05-03 NOTE — Progress Notes (Signed)
Patient getting plasmaphoresis.  His hemoglobin has stabilized around 7.7.    He has no known rectal bleedings.  Will continue to check the chart for any surgical concerns.  Kathryne Eriksson. Dahlia Bailiff, MD, [place] [phone] [facility] Surgery

## 2016-05-03 NOTE — Progress Notes (Signed)
[facility] PROGRESS NOTE Subjective patient now on the floor is not had any significant bleeding no bowel movement in several days. Surgery has been following. Is on solid diet and oral Protonix BID.  Objective: Vital signs in last 24 hours: Temp:  [96.9 F (36.1 C)-98.6 F (37 C)] 97.7 F (36.5 C) (02/20 0723) Pulse Rate:  [64-94] 66 (02/20 0723) Resp:  [14-20] 18 (02/20 0723) BP: (112-146)/(42-85) 146/68 (02/20 0723) SpO2:  [98 %-100 %] 100 % (02/20 0723) Weight:  [81.3 kg (179 lb 3.2 oz)] 81.3 kg (179 lb 3.2 oz) (02/20 0500) Last BM Date: 05/02/16  Intake/Output from previous day: 02/19 0701 - 02/20 0700 In: 1415 [P.O.:480; I.V.:600; Blood:335] Out: 1900 [Urine:1900] Intake/Output this shift: Total I/O In: -  Out: 300 [Urine:300]  PE: General-- no distress  Abdomen-- nontender  Lab Results:  Recent Labs  04/30/16 1232 05/01/16 0209 05/02/16 0410 05/02/16 1515  WBC 11.5* 8.2 6.4 9.2  HGB 9.0* 8.2* 7.9* 7.5*  HCT 27.1* 24.1* 23.6* 22.4*  PLT 174 146* 135* 123*   BMET  Recent Labs  05/01/16 0209 05/01/16 0824 05/01/16 1634 05/02/16 0410  NA 142  --  143 142  K 5.8* 5.0 4.4 4.4  CL 117*  --  111 112*  CO2 19*  --  20* 22  CREATININE 4.54*  --  4.17* 3.84*   LFT  Recent Labs  05/02/16 0410  PROT 4.5*  AST 12*  ALT 15*  ALKPHOS 59  BILITOT 0.7   PT/INR No results for input(s): LABPROT, INR in the last 72 hours. PANCREAS No results for input(s): LIPASE in the last 72 hours.       Studies/Results: No results found.  Medications: I have reviewed the patient's current medications.  Assessment/Plan:  1. Actively bleeding large duodenal bulb ulcer. 4 days status post treatment with combination of BICAP, Epi injection, and endoscopic clipping. Hemoglobin has drifted down some but there is no active bleeding. At this point I think his risk of rebleeding fairly low and would continue him on oral Protonix. Should he rebleeding  significantly I would attempt to repeat his EGD and control endoscopically if possible. We will go ahead and give him Miralax in order to move his bowels.   Daryel Kenneth JR,Varshini Arrants L 05/03/2016, 8:15 AM  This note was created using voice recognition software. Minor errors may Have occurred unintentionally.  Pager: [phone] If no answer or after hours call [phone]

## 2016-05-03 NOTE — Progress Notes (Signed)
[facility] PROGRESS NOTE  Nicholas Hudson [id number] DOB: Apr 02, 1940 DOA: 04/16/2016  PCP: Kandice Hams, MD  Brief History/Interval Summary: Nicholas Hudson is an 76 y.o. male with medical history significant of PBH, gout, recurrent sinus infection, hypothyroidism. Patient with history of sinus, polyps problems, infection for last 6 to 8 weeks, he has received 2 course of antibiotics by Dr Lucia Gaskins. Patient was found to have worsening renal function. He was hospitalized for further management. Patient seen by nephrology. Renal function  stabilized after a few sessions of dialysis. 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 Patient started on dialysis. Also started on plasmapheresis.   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 Joelyn Oms prior to all transfusions to monitor volume status  Appreciate  [facility]   evaluation EGD                 -  non-obstructing oozing duodenal ulcer with a                            visible vessel. Treatment not successful. Treated                            with bipolar cautery. Injected. Clip was placed.                                                  Dr Oletta Lamas recommended general surgery consult ,which has been completed Advance diet Patient has had about 5 units of packed red blood cells. Due to no significant improvement in hemoglobin, will repeat PT/INR No significant change in hemoglobin despite 1 unit of packed red blood cells on  2/19 Will transfuse 1 more unit to keep hemoglobin greater than 8.0 According to GI  his risk of rebleeding fairly low and would continue him on oral Protonix   Myeloperoxidase antibody positive ANCA glomerulonephritis/AKI Patient's acute kidney injury appears to have developed over the last 1-2 months. His creatinine was 1.0 about a year ago. Nephrology was consulted. Autoimmune workup is positive for an elevated myeloperoxidase antibody. 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  Renal biopsy was performed 2/7. 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 Completed pulse steroids;   Pred 60 mg/day which was changed to iv solumedrol .  Patient started on cyclophosphamide which is now on hold  , continue  Bactrim for prophylaxis.   .  Being dialyzed based on renal function and  urine output  Creatinine improving  4.4>4.9>4.63>4.54>3.84  Also, on plasmapheresis -now his 4th out of 7 treatments of every other day  Renal function stable, good uop k 5.8 , dr sanford notified , combination of steroids and bactrim, now resolved  with kayexalate  Off HD  For several days now   Sepsis  of unclear source Culture data negative till date. Repeat chest x-ray is negative. Patient was initially started on the sepsis pathway. He was given vancomycin and Zosyn. Vancomycin was discontinued due to renal failure. No clear infectious foci. MRI brain did not show any acute abnormalities. MRI abdomen did not show any intra-abdominal process. Zosyn was discontinued as well. Patient could've had a viral syndrome. MRI brain did show paranasal sinus disease and mastoid fluid. However patient has recently completed multiple courses of antibiotics for same. Continue to monitor off of antibiotics. Sepsis physiology, resolved. WBC is normal.   Metabolic encephalopathy Resolved. MRI brain did not show any acute process. Confusion could have been due to acute  renal failure.   Elevated alkaline phosphatase Patient noted an elevated alkaline phosphatase. AST, ALT normal. Bilirubin normal. Abdominal ultrasound showed possible pancreatic mass. MRI did not show any lesion in the pancreas. Finding on the ultrasound was thought to be artifactual from bowel. Outpatient follow-up for elevated alkaline phosphatase.  Elevated blood pressure Patient does not seem to be on any blood pressure lowering agents at home. Blood pressure could be elevated due to effect of steroid. Continue to monitor for now.Will start prn hydralazine as needed   Hyperglycemia No history of diabetes as outpatient. Hyperglycemia is likely due to steroids. Since he is now just on oral steroids, we decreased the dose of his insulin. HbA1c was 6.5. Continue to monitor CBGs and adjust dose of insulin as needed.   Hypothyroidism Continue Synthroid.  Hyponatremia Has improved after dialysis.   DVT Prophylaxis: SCDs  Code Status: Full code  Family Communication: Discussed with patient, wife and daughter Disposition Plan: continue tele,     Consultants: Nephrology, gastroenterology,gen surgery  Procedures:  Renal biopsy 2/7  Placement of HD catheter 2/9  Echocardiogram Study Conclusions  - Left ventricle: The cavity size was normal. There was moderate   concentric hypertrophy. Systolic function was normal. The   estimated ejection fraction was in the range of 50% to 55%. Wall   motion was normal; there were no regional wall motion   abnormalities. Doppler parameters are consistent with abnormal   left ventricular relaxation (grade 1 diastolic dysfunction). - Aortic valve: Trileaflet; mildly thickened, mildly calcified   leaflets. - Aorta: Ascending aortic diameter: 42 mm (S). - Ascending aorta: The ascending aorta was mildly dilated. - Mitral valve: There was mild regurgitation. - Left atrium: The atrium was mildly dilated.  Hemodialysis  Plasmapheresis  starting 2/12  Antibiotics: Initially started on vancomycin and Zosyn. Both of these antibiotics have been discontinued.  Subjective/Interval History: No hematochezia   ROS: Denies any chest pain or shortness of breath  Objective:  Vital Signs  Vitals:   05/03/16 0822 05/03/16 0842 05/03/16 0846 05/03/16 0900  BP: (!) 154/75 (!) 130/58 (P) 137/67 131/64  Pulse: 69 73 67 70  Resp: 17 17 15  (!) 21  Temp: 97.7 F (36.5 C) 97.7 F (36.5 C) 97.7 F (36.5 C) 97.7 F (36.5 C)  TempSrc: Oral     SpO2: 100% 100%    Weight:      Height:        Intake/Output Summary (Last 24 hours) at 05/03/16 0908 Last data filed at 05/03/16 0723  Gross per 24 hour  Intake             1415 ml  Output             1850 ml  Net             -  435 ml   Filed Weights   05/01/16 0500 05/02/16 0419 05/03/16 0500  Weight: 82 kg (180 lb 12.4 oz) 80.5 kg (177 lb 6.4 oz) 81.3 kg (179 lb 3.2 oz)    General appearance: alert, cooperative, appears stated age and no distress Resp: clear to auscultation bilaterally Cardio: regular rate and rhythm, S1, S2 normal, no murmur, click, rub or gallop GI: soft, non-tender; bowel sounds normal; no masses,  no organomegaly Extremities: Does have some edema bilateral lower extremity Neurologic: Awake and alert. No focal deficits.  Lab Results:  Data Reviewed: I have personally reviewed following labs and imaging studies  CBC:  Recent Labs Lab 04/30/16 1232 05/01/16 0209 05/02/16 0410 05/02/16 1515 05/03/16 0758  WBC 11.5* 8.2 6.4 9.2 9.5  HGB 9.0* 8.2* 7.9* 7.5* 7.7*  HCT 27.1* 24.1* 23.6* 22.4* 23.0*  MCV 82.4 82.0 81.9 82.4 82.7  PLT 174 146* 135* 123* 124*    Basic Metabolic Panel:  Recent Labs Lab 04/30/16 0451 05/01/16 0209 05/01/16 0824 05/01/16 1634 05/02/16 0410 05/02/16 0952 05/03/16 0758  NA 142 142  --  143 142  --  143  K 4.9 5.8* 5.0 4.4 4.4  --  4.1  CL 114* 117*  --  111 112*  --  113*  CO2 18* 19*  --  20* 22  --  22    GLUCOSE 142* 145*  --  193* 178*  --  95  BUN 90* 93*  --  85* 81*  --  79*  CREATININE 4.63* 4.54*  --  4.17* 3.84*  --  3.46*  CALCIUM 7.9* 7.9*  --  7.7* 7.9*  --  8.0*  MG  --   --   --   --   --  1.5*  --     GFR: Estimated Creatinine Clearance: 17.6 mL/min (by C-G formula based on SCr of 3.46 mg/dL (H)).  Liver Function Tests:  Recent Labs Lab 04/29/16 0629 04/30/16 0451 05/02/16 0410  AST 13* 12* 12*  ALT 13* 15* 15*  ALKPHOS 47 49 59  BILITOT 1.0 0.6 0.7  PROT 3.9* 4.3* 4.5*  ALBUMIN 2.8* 2.9* 3.0*    CBG:  Recent Labs Lab 05/02/16 0729 05/02/16 1149 05/02/16 1817 05/02/16 2121 05/03/16 0721  GLUCAP 128* 108* 126* 133* 92     Recent Results (from the past 240 hour(s))  MRSA PCR Screening     Status: None   Collection Time: 04/26/16  2:02 PM  Result Value Ref Range Status   MRSA by PCR NEGATIVE NEGATIVE Final    Comment:        The GeneXpert MRSA Assay (FDA approved for NASAL specimens only), is one component of a comprehensive MRSA colonization surveillance program. It is not intended to diagnose MRSA infection nor to guide or monitor treatment for MRSA infections.   MRSA PCR Screening     Status: None   Collection Time: 04/29/16  4:42 PM  Result Value Ref Range Status   MRSA by PCR NEGATIVE NEGATIVE Final    Comment:        The GeneXpert MRSA Assay (FDA approved for NASAL specimens only), is one component of a comprehensive MRSA colonization surveillance program. It is not intended to diagnose MRSA infection nor to guide or monitor treatment for MRSA infections.       Radiology Studies: No results found.   Medications:  Scheduled: . sodium chloride   Intravenous Once  . sodium chloride   Intravenous Once  .  therapeutic plasma exchange solution   Dialysis Q1 Hr x 5  . anticoagulant sodium citrate  5 mL Intracatheter Once  . calcium carbonate      . calcium carbonate  2 tablet Oral Q3H  . calcium gluconate IVPB  4 g  Intravenous Once  . gabapentin  100 mg Oral QHS  . insulin aspart  0-15 Units Subcutaneous TID WC  . insulin aspart  0-5 Units Subcutaneous QHS  . insulin aspart  3 Units Subcutaneous TID WC  . levothyroxine  100 mcg Intravenous Daily  . metoprolol tartrate  12.5 mg Oral BID  . pantoprazole  40 mg Oral BID  . polyethylene glycol  17 g Oral BID  . predniSONE  60 mg Oral Q breakfast  . sodium bicarbonate  650 mg Oral BID  . sulfamethoxazole-trimethoprim  1 tablet Oral Q M,W,F-1800   Continuous: . citrate dextrose 500 mL (05/03/16 0858)   [id number] **OR** acetaminophen, acetaminophen, diphenhydrAMINE, hydrALAZINE, ondansetron **OR** ondansetron (ZOFRAN) IV, traMADol, zolpidem      LOS: 17 days   [facility]  [facility] Pager [phone] 05/03/2016, 9:08 AM  If 7PM-7AM, please contact night-coverage at [email or web address], password [facility]

## 2016-05-04 DIAGNOSIS — I776 Arteritis, unspecified: Secondary | ICD-10-CM

## 2016-05-04 DIAGNOSIS — D649 Anemia, unspecified: Secondary | ICD-10-CM

## 2016-05-04 DIAGNOSIS — N289 Disorder of kidney and ureter, unspecified: Secondary | ICD-10-CM

## 2016-05-04 DIAGNOSIS — N059 Unspecified nephritic syndrome with unspecified morphologic changes: Secondary | ICD-10-CM

## 2016-05-04 LAB — RETICULOCYTES
RBC.: 2.81 MIL/uL — AB (ref 4.22–5.81)
RETIC COUNT ABSOLUTE: 30.9 10*3/uL (ref 19.0–186.0)
RETIC CT PCT: 1.1 % (ref 0.4–3.1)

## 2016-05-04 LAB — COMPREHENSIVE METABOLIC PANEL
ALBUMIN: 4 g/dL (ref 3.5–5.0)
ALK PHOS: 34 U/L — AB (ref 38–126)
ALT: 15 U/L — AB (ref 17–63)
AST: 13 U/L — AB (ref 15–41)
Anion gap: 8 (ref 5–15)
BILIRUBIN TOTAL: 0.8 mg/dL (ref 0.3–1.2)
BUN: 54 mg/dL — AB (ref 6–20)
CO2: 21 mmol/L — ABNORMAL LOW (ref 22–32)
CREATININE: 3.02 mg/dL — AB (ref 0.61–1.24)
Calcium: 8 mg/dL — ABNORMAL LOW (ref 8.9–10.3)
Chloride: 112 mmol/L — ABNORMAL HIGH (ref 101–111)
GFR calc Af Amer: 21 mL/min — ABNORMAL LOW (ref >60)
GFR, EST NON AFRICAN AMERICAN: 19 mL/min — AB (ref >60)
GLUCOSE: 166 mg/dL — AB (ref 65–99)
POTASSIUM: 4.5 mmol/L (ref 3.5–5.1)
Sodium: 141 mmol/L (ref 135–145)
TOTAL PROTEIN: 5.1 g/dL — AB (ref 6.5–8.1)

## 2016-05-04 LAB — PREPARE FRESH FROZEN PLASMA
BLOOD PRODUCT EXPIRATION DATE: 201802242359
Blood Product Expiration Date: 201802242359
Blood Product Expiration Date: 201802242359
Blood Product Expiration Date: 201802242359
ISSUE DATE / TIME: 201802201006
ISSUE DATE / TIME: 201802201006
ISSUE DATE / TIME: 201802201006
ISSUE DATE / TIME: 201802201006
UNIT TYPE AND RH: 6200
UNIT TYPE AND RH: 6200
UNIT TYPE AND RH: 6200
Unit Type and Rh: 6200

## 2016-05-04 LAB — CBC
HEMATOCRIT: 22.2 % — AB (ref 39.0–52.0)
Hemoglobin: 7.4 g/dL — ABNORMAL LOW (ref 13.0–17.0)
MCH: 28 pg (ref 26.0–34.0)
MCHC: 33.3 g/dL (ref 30.0–36.0)
MCV: 84.1 fL (ref 78.0–100.0)
PLATELETS: 108 10*3/uL — AB (ref 150–400)
RBC: 2.64 MIL/uL — ABNORMAL LOW (ref 4.22–5.81)
RDW: 16.9 % — AB (ref 11.5–15.5)
WBC: 4 10*3/uL (ref 4.0–10.5)

## 2016-05-04 LAB — GLUCOSE, CAPILLARY
GLUCOSE-CAPILLARY: 134 mg/dL — AB (ref 65–99)
Glucose-Capillary: 147 mg/dL — ABNORMAL HIGH (ref 65–99)
Glucose-Capillary: 128 mg/dL — ABNORMAL HIGH (ref 65–99)
Glucose-Capillary: 127 mg/dL — ABNORMAL HIGH (ref 65–99)

## 2016-05-04 LAB — LACTATE DEHYDROGENASE: LDH: 175 U/L (ref 98–192)

## 2016-05-04 LAB — DIRECT ANTIGLOBULIN TEST (NOT AT ARMC)
DAT, IgG: NEGATIVE
DAT, complement: NEGATIVE

## 2016-05-04 MED ORDER — MAGNESIUM SULFATE 2 GM/50ML IV SOLN
2.0000 g | Freq: Once | INTRAVENOUS | Status: AC
  Administered 2016-05-04: 2 g via INTRAVENOUS
  Filled 2016-05-04: qty 50

## 2016-05-04 NOTE — Progress Notes (Signed)
PT Cancellation Note  Patient Details Name: Nicholas Hudson MRN: [id number] DOB: 03/08/1941   Cancelled Treatment:    Reason Eval/Treat Not Completed: Other (comment) (Pt declined as he had been up walking a great deal today AM) . States he was not light headed with AM walks, and will try again tomorrow to work with him as he declined bed ex as well.  [name] 05/04/2016, 1:44 PM   Mee Hives, PT MS Acute Rehab Dept. Number: [place]

## 2016-05-04 NOTE — Consult Note (Signed)
Reason for Referral: Anemia.   HPI: 77 year old gentleman currently of [place] where he lived the majority of his life. He was hospitalized on 04/16/2016 with acute renal failure after presenting with complaints of weakness, fatigue and lower extremity edema. At that time his creatinine was elevated to 3.0 previously was within normal range. He had an extensive workup for his acute renal failure that led to a kidney biopsy on 04/20/2016. 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 He was treated with high-dose steroids as well as Cytoxan initially. And he has been receiving within therapeutic plasma exchange for a total of 7 treatments starting on 04/27/2016.   He did develop a duodenal ulcer and GI bleeding and a hemoglobin dropped on 04/29/2016 down to 6.5. His hemoglobin on admission was 13.6 and have stabilized around 10.0 before the acute drop at that time. His bleeding ulcer was treated endoscopically and has not had any clinical signs symptoms of bleeding since that time. His hemoglobin however continues to decline despite packed red cell transfusion. His hemoglobin was up to 9.0 after transfusion on 04/30/2016. A drop to 6.1 on 05/03/2016 and received more transfusion at that time. And on 05/04/2016 his hemoglobin was 7.4. His white cell count has been normal with a platelet count of 108.  His reticulocyte count is 1.1 with normal LDH, and Coombs testing. He continues to deny any signs or symptoms of bleeding including hematochezia or melena. He denied any abdominal pain or distention. He denied any altered mental status or confusion. He did not report any headaches or blurry vision.  He denies any chest pain, palpitation or dyspnea on exertion. He does not report any cough, wheezing or hemoptysis. He does not report any nausea, vomiting or abdominal pain. He does not report any frequency urgency or hesitancy. He does not report any skeletal complaints. Remaining  review of systems unremarkable.   Past Medical History:  Diagnosis Date  . BPH (benign prostatic hyperplasia)   . Gout   . Hyperlipidemia   . Renal disorder   . Thyroid disease   :  Past Surgical History:  Procedure Laterality Date  . ESOPHAGOGASTRODUODENOSCOPY [name] 04/29/2016   Procedure: ESOPHAGOGASTRODUODENOSCOPY (EGD);  Surgeon: Laurence Spates, MD;  Location[facility] ENDOSCOPY;  Service: Endoscopy;  Laterality: [name];  . IR GENERIC HISTORICAL  04/22/2016   IR US GUIDE VASC ACCESS RIGHT 04/22/2016 Sandi Mariscal, MD [facility]INTER[facility]  . IR GENERIC HISTORICAL  04/22/2016   IR FLUORO GUIDE CV LINE RIGHT 04/22/2016 Sandi Mariscal, MD [facility]  :   Current Facility-Administered Medications:  .  0.9 %  sodium chloride infusion, , Intravenous, Once, Reyne Dumas, MD .  acetaminophen (TYLENOL) tablet 650 mg, 650 mg, Oral, Q6H PRN, 650 mg at 04/18/16 0814 **OR** acetaminophen (TYLENOL) suppository 650 mg, 650 mg, Rectal, Q6H PRN, Belkys A Regalado, MD .  acetaminophen (TYLENOL) tablet 650 mg, 650 mg, Oral, Q4H PRN, Donato Heinz, MD .  citrate dextrose (ACD-A anticoagulant) solution 500 mL, 500 mL, Intravenous, Continuous, Donato Heinz, MD, Last Rate: 5 mL/hr at 05/03/16 0858, 500 mL at 05/03/16 0858 .  diphenhydrAMINE (BENADRYL) capsule 25 mg, 25 mg, Oral, Q6H PRN, Donato Heinz, MD .  gabapentin (NEURONTIN) capsule 100 mg, 100 mg, Oral, QHS, Charlynne Cousins, MD, 100 mg at 05/03/16 2129 .  hydrALAZINE (APRESOLINE) injection 5 mg, 5 mg, Intravenous, Q6H PRN, Reyne Dumas, MD .  insulin aspart (novoLOG) injection 0-15 Units, 0-15 Units, Subcutaneous, TID WC, Charlynne Cousins, MD, 2 Units at  05/04/16 1259 .  insulin aspart (novoLOG) injection 0-5 Units, 0-5 Units, Subcutaneous, QHS, Charlynne Cousins, MD, 2 Units at 04/21/16 2107 .  insulin aspart (novoLOG) injection 3 Units, 3 Units, Subcutaneous, TID WC, Bonnielee Haff, MD, 3 Units at 05/04/16 1259 .  levothyroxine (SYNTHROID, LEVOTHROID)  tablet 200 mcg, 200 mcg, Oral, QAC breakfast, Skeet Simmer, RPH, 200 mcg at 05/04/16 [id number] .  metoprolol tartrate (LOPRESSOR) tablet 12.5 mg, 12.5 mg, Oral, BID, Reyne Dumas, MD, 12.5 mg at 05/04/16 0832 .  ondansetron (ZOFRAN) tablet 4 mg, 4 mg, Oral, Q6H PRN, 4 mg at 04/26/16 2247 **OR** ondansetron (ZOFRAN) injection 4 mg, 4 mg, Intravenous, Q6H PRN, Belkys A Regalado, MD, 4 mg at 04/29/16 1536 .  pantoprazole (PROTONIX) EC tablet 40 mg, 40 mg, Oral, BID, Reyne Dumas, MD, 40 mg at 05/04/16 [id number] .  polyethylene glycol (MIRALAX / GLYCOLAX) packet 17 g, 17 g, Oral, BID, Laurence Spates, MD, 17 g at 05/03/16 2129 .  predniSONE (DELTASONE) tablet 60 mg, 60 mg, Oral, Q breakfast, Rexene Agent, MD, 60 mg at 05/04/16 [phone] .  sodium bicarbonate tablet 650 mg, 650 mg, Oral, BID, Rexene Agent, MD, 650 mg at 05/04/16 [phone] .  sulfamethoxazole-trimethoprim (BACTRIM DS,SEPTRA DS) 800-160 MG per tablet 1 tablet, 1 tablet, Oral, Q M,W,F-1800, Roney Jaffe, MD, 1 tablet at 05/02/16 1818 .  traMADol (ULTRAM) tablet 50 mg, 50 mg, Oral, Q6H PRN, Belkys A Regalado, MD, 50 mg at 04/26/16 2246 .  zolpidem (AMBIEN) tablet 5 mg, 5 mg, Oral, QHS PRN, Jani Gravel, MD, 5 mg at 05/03/16 2129:  No Known Allergies:  History reviewed. No pertinent family history.:  Social History   Social History  . Marital status: Married    Spouse name: [name]  . Number of children: [name]  . Years of education: [name]   Occupational History  . Not on file.   Social History Main Topics  . Smoking status: Never Smoker  . Smokeless tobacco: Never Used  . Alcohol use No  . Drug use: Unknown  . Sexual activity: Not on file   Other Topics Concern  . Not on file   Social History Narrative  . No narrative on file  :  Pertinent items are noted in HPI.  Exam: Blood pressure 139/61, pulse (!) 56, temperature 98 F (36.7 C), temperature source Oral, resp. rate 16, height 5\' 5"  (1.651 m), weight 177 lb 11.2 oz (80.6 kg), SpO2 100  %. General appearance: alert and cooperative Throat: lips, mucosa, and tongue normal; teeth and gums normal Neck: no adenopathy Back: negative Resp: clear to auscultation bilaterally Cardio: regular rate and rhythm, S1, S2 normal, no murmur, click, rub or gallop GI: soft, non-tender; bowel sounds normal; no masses,  no organomegaly Extremities: extremities normal, atraumatic, no cyanosis or edema Skin: Skin color, texture, turgor normal. No rashes or lesions   Recent Labs  05/03/16 0758 05/03/16 0836 05/04/16 0548  WBC 9.5  --  4.0  HGB 7.7* 6.1* 7.4*  HCT 23.0* 18.0* 22.2*  PLT 124*  --  108*    Recent Labs  05/03/16 0758 05/03/16 0836 05/04/16 0548  NA 143 144 141  K 4.1 4.0 4.5  CL 113* 109 112*  CO2 22  --  21*  GLUCOSE 95 91 166*  BUN 79* 85* 54*  CREATININE 3.46* 3.50* 3.02*  CALCIUM 8.0*  --  8.0*       [name] Contrast  Result Date: 04/17/2016 CLINICAL DATA:  76 year old  male with progressive headache frequency for 6-8 weeks. Increased fatigue and lethargy. Increased confusion for 4 days. Mild fever. Worsening renal function. Initial encounter. EXAM: MRI HEAD WITHOUT CONTRAST TECHNIQUE: Multiplanar, multiecho pulse sequences of the brain and surrounding structures were obtained without intravenous contrast. COMPARISON:  Face CT 03/23/2016. FINDINGS: Brain: Cerebral volume loss stable from the recent face CT, appears fairly generalized except for some bilateral disproportionate perisylvian involvement. No restricted diffusion to suggest acute infarction. No midline shift, mass effect, evidence of mass lesion, ventriculomegaly, extra-axial collection or acute intracranial hemorrhage. Cervicomedullary junction and pituitary are within normal limits. Pearline Cables and white matter signal is within normal limits for age throughout the brain. No cortical encephalomalacia or chronic cerebral blood products. Vascular: Major intracranial vascular flow voids are preserved. Skull and  upper cervical spine: Negative. Visualized bone marrow signal is within normal limits. Sinuses/Orbits: Widespread sinus and nasal cavity opacification not significantly changed from the recent face CT. 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 Right mastoid effusion appears increased, although the nasopharynx are it within normal limits. Left mastoids are clear. Other: Negative scalp soft tissues IMPRESSION: 1. No acute intracranial abnormality. Normal for age noncontrast. And aside from nonspecific perisylvian cerebral volume loss which may be mildly age advanced. 2. Continued widespread paranasal sinus disease as described by CT in January. 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 Probably related right mastoid effusion which appears increased since January. Electronically Signed   By: Genevie Ann M.D.   On: 04/17/2016 12:13    Mr Abdomen Mrcp Wo Contrast  Result Date: 04/19/2016 CLINICAL DATA:  Possible pancreatic mass on ultrasound. EXAM: MRI ABDOMEN WITHOUT CONTRAST  (INCLUDING MRCP) TECHNIQUE: Multiplanar multisequence MR imaging of the abdomen was performed. Heavily T2-weighted images of the biliary and pancreatic ducts were obtained, and three-dimensional MRCP images were rendered by post processing. IV contrast could not be given due to renal failure. COMPARISON:  04/17/2016 FINDINGS: Despite efforts by the technologist and patient, motion artifact is present on today's exam and could not be eliminated. This reduces exam sensitivity and specificity. Lower chest: Suspected mild atelectasis in the lower lobes. Hepatobiliary: Contracted gallbladder. Motion artifact precludes sensitive assessment of the biliary tree due to its severity, but there is no biliary dilatation. Pancreas: In the area of concern along the dorsal pancreas at the junction of the pancreatic body and tail, no abnormality is observed. The pancreas appears normal on MRI. Spleen:  Unremarkable Adrenals/Urinary Tract:  Unremarkable  Stomach/Bowel: Unremarkable Vascular/Lymphatic:  Unremarkable Other:  No supplemental non-categorized findings. Musculoskeletal: Unremarkable IMPRESSION: 1. In the area of concern on ultrasound there is no abnormality on today' s noncontrast MRI. I suspect that the region was spurious or due to adjacent bowel. 2. Mild bibasilar atelectasis. 3. There is fairly severe motion artifact which precludes sensitive assessment of the biliary tree, but no biliary dilatation is observed. Electronically Signed   By: Van Clines M.D.   On: 04/19/2016 12:12       Assessment and Plan:   76 year old gentleman with the following issues:  1. Anemia that appears to be normocytic and normochromic. He presented with normal hemoglobin upon his hospitalization on 04/16/2016. His hemoglobin has stabilized around 10 and 11 throughout his hospital stay until he developed a duodenal ulcer and bleeding on 04/29/2016. Hemoglobin dropped to 6 and require multiple transfusions. Now his hemoglobin continues to drop slowly despite transfusion without any evidence of bleeding.  The differential diagnosis was discussed today with the patient and his wife. The etiology is likely multifactorial in nature.  He could have an element of worsening iron deficiency related to his GI bleed, blood loss related to plasma exchange, anemia related to his renal insufficiency and low erythropoietin production, bone marrow suppression related to Cytoxan and acute illness.  There is no evidence to suggest acute hemolysis at this time with negative Coombs' testing and normal LDH. His reticulocyte count is not elevated. I doubt a primary bone marrow disorder at this time given the normal white cell count and reasonably stable platelet count.  For a management standpoint, I will obtain iron studies with the next blood draw to ensure adequate iron replacement in this particular setting. Intravenous iron infusion would be recommended if his iron  levels are low.  Growth factor support and also be considered in this setting in the form of Procrit or Aranesp.  I have no objections to continuing transfusing as needed to keep his hemoglobin between 7 and 8 at minimum.  I would recommend obtaining imaging studies of the abdomen and pelvis if he develops any abdominal symptoms or any change in his clinical status to rule out an intra-abdominal hematoma as a source of his blood loss.  2. Renal failure: Attributed to vasculitis and glomerulonephritis. He is currently receiving therapeutic plasma exchange and currently on prednisone 60 mg daily. I have no objections to restarting Cytoxan if needed to because of his anemia.

## 2016-05-04 NOTE — Progress Notes (Signed)
Patient ID: VIRGIL BRANDENBURG, male   DOB: 04/20/1940, 76 y.o.   MRN: [id number] S:No new complaints O:BP 139/61 (BP Location: Right Arm)   Pulse (!) 56   Temp 98 F (36.7 C) (Oral)   Resp 16   Ht 5\' 5"  (1.651 m)   Wt 80.6 kg (177 lb 11.2 oz)   SpO2 100%   BMI 29.57 kg/m   Intake/Output Summary (Last 24 hours) at 05/04/16 1417 Last data filed at 05/04/16 0757  Gross per 24 hour  Intake              365 ml  Output             2600 ml  Net            -2235 ml   Intake/Output: I/O last 3 completed shifts: In: 1180 [P.O.:480; Blood:700] Out: 3651 [Urine:3650; Stool:1]  Intake/Output this shift:  Total I/O In: -  Out: 600 [Urine:600] Weight change: -0.68 kg (-1 lb 8 oz) Gen:WD WN WM in NAD CVS:no rub Resp:cta [id number] pretib edema   Recent Labs Lab 04/29/16 0629 04/30/16 0451 05/01/16 0209 05/01/16 0824 05/01/16 1634 05/02/16 0410 05/03/16 0758 05/03/16 0836 05/04/16 0548  NA 138 142 142  --  143 142 143 144 141  K 4.5 4.9 5.8* 5.0 4.4 4.4 4.1 4.0 4.5  CL 110 114* 117*  --  111 112* 113* 109 112*  CO2 19* 18* 19*  --  20* 22 22  --  21*  GLUCOSE 112* 142* 145*  --  193* 178* 95 91 166*  BUN 71* 90* 93*  --  85* 81* 79* 85* 54*  CREATININE 4.88* 4.63* 4.54*  --  4.17* 3.84* 3.46* 3.50* 3.02*  ALBUMIN 2.8* 2.9*  --   --   --  3.0*  --   --  4.0  CALCIUM 7.5* 7.9* 7.9*  --  7.7* 7.9* 8.0*  --  8.0*  AST 13* 12*  --   --   --  12*  --   --  13*  ALT 13* 15*  --   --   --  15*  --   --  15*   Liver Function Tests:  Recent Labs Lab 04/30/16 0451 05/02/16 0410 05/04/16 0548  AST 12* 12* 13*  ALT 15* 15* 15*  ALKPHOS 49 59 34*  BILITOT 0.6 0.7 0.8  PROT 4.3* 4.5* 5.1*  ALBUMIN 2.9* 3.0* 4.0   No results for input(s): LIPASE, AMYLASE in the last 168 hours. No results for input(s): AMMONIA in the last 168 hours. CBC:  Recent Labs Lab 05/01/16 0209 05/02/16 0410 05/02/16 1515 05/03/16 0758 05/03/16 0836 05/04/16 0548  WBC 8.2 6.4 9.2 9.5   --  4.0  HGB 8.2* 7.9* 7.5* 7.7* 6.1* 7.4*  HCT 24.1* 23.6* 22.4* 23.0* 18.0* 22.2*  MCV 82.0 81.9 82.4 82.7  --  84.1  PLT 146* 135* 123* 124*  --  108*   Cardiac Enzymes:  Recent Labs Lab 05/02/16 0952 05/02/16 1500  TROPONINI <0.03 <0.03   CBG:  Recent Labs Lab 05/03/16 1154 05/03/16 1712 05/03/16 2016 05/04/16 0722 05/04/16 1138  GLUCAP 129* 106* 187* 147* 134*    Iron Studies: No results for input(s): IRON, TIBC, TRANSFERRIN, FERRITIN in the last 72 hours. Studies/Results: No results found. . sodium chloride   Intravenous Once  . gabapentin  100 mg Oral QHS  . insulin aspart  0-15 Units Subcutaneous TID WC  . insulin aspart  0-5 Units Subcutaneous QHS  . insulin aspart  3 Units Subcutaneous TID WC  . levothyroxine  200 mcg Oral QAC breakfast  . metoprolol tartrate  12.5 mg Oral BID  . pantoprazole  40 mg Oral BID  . polyethylene glycol  17 g Oral BID  . predniSONE  60 mg Oral Q breakfast  . sodium bicarbonate  650 mg Oral BID  . sulfamethoxazole-trimethoprim  1 tablet Oral Q M,W,F-1800    BMET    Component Value Date/Time   NA 141 05/04/2016 0548   K 4.5 05/04/2016 0548   CL 112 (H) 05/04/2016 0548   CO2 21 (L) 05/04/2016 0548   GLUCOSE 166 (H) 05/04/2016 0548   BUN 54 (H) 05/04/2016 0548   CREATININE 3.02 (H) 05/04/2016 0548   CALCIUM 8.0 (L) 05/04/2016 0548   GFRNONAA 19 (L) 05/04/2016 0548   GFRAA 21 (L) 05/04/2016 0548   CBC    Component Value Date/Time   WBC 4.0 05/04/2016 0548   RBC 2.81 (L) 05/04/2016 1012   RBC 2.64 (L) 05/04/2016 0548   HGB 7.4 (L) 05/04/2016 0548   HCT 22.2 (L) 05/04/2016 0548   PLT 108 (L) 05/04/2016 0548   MCV 84.1 05/04/2016 0548   MCH 28.0 05/04/2016 0548   MCHC 33.3 05/04/2016 0548   RDW 16.9 (H) 05/04/2016 0548   LYMPHSABS 1.0 04/17/2016 0823   MONOABS 0.8 04/17/2016 0823   EOSABS 0.1 04/17/2016 0823   BASOSABS 0.0 04/17/2016 0823     Assessment/Plan:  1. 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 S/p renal bx 04/20/16 with pauci immune gn 40% crescents, ATN, minimal scarring 2. S/p pulse solumedrol now on prednisone 60mg  daily 3. Po cytoxan on hold for GI bleed until next week 4. TPE 1.5 PV x 7 qod, albumin colloid on 2/14, 2/19, 2/20, and for last 2 on 2/22 and 2/24.  2. UGIB 04/28/16 due to duodenal ulcer s/p clipping and on PPI 3. ABLA/anemia of chronic disease 4. hyperkalemia 5. DM 6. HTN- stable  Donetta Potts, MD [employer] [phone]

## 2016-05-04 NOTE — Progress Notes (Signed)
[facility] PROGRESS NOTE Subjective patient started on Miralax yesterday but has not as of yet had a bowel movement. He has not seen any sign clinical bleeding but his hemoglobin did drop to 6.1 he received the unit of blood yesterday. He continues with plasmapheresis for treatment of his acute glomerulonephritis secondary to ANCA ass vasculitis.  Objective: Vital signs in last 24 hours: Temp:  [97.5 F (36.4 C)-98.9 F (37.2 C)] 98.8 F (37.1 C) (02/21 0726) Pulse Rate:  [59-80] 68 (02/21 0726) Resp:  [10-21] 13 (02/21 0726) BP: (116-154)/(47-85) 142/68 (02/21 0726) SpO2:  [96 %-100 %] 99 % (02/21 0726) Weight:  [80.6 kg (177 lb 11.2 oz)] 80.6 kg (177 lb 11.2 oz) (02/21 0411) Last BM Date: 05/02/16  Intake/Output from previous day: 02/20 0701 - 02/21 0700 In: 845 [P.O.:480; Blood:365] Out: 2851 [Urine:2850; Stool:1] Intake/Output this shift: Total I/O In: -  Out: 400 [Urine:400]  PE: - Abdomen-- soft and nontender  Lab Results:  Recent Labs  05/02/16 0410 05/02/16 1515 05/03/16 0758 05/03/16 0836 05/04/16 0548  WBC 6.4 9.2 9.5  --  4.0  HGB 7.9* 7.5* 7.7* 6.1* 7.4*  HCT 23.6* 22.4* 23.0* 18.0* 22.2*  PLT 135* 123* 124*  --  108*   BMET  Recent Labs  05/01/16 1634 05/02/16 0410 05/03/16 0758 05/03/16 0836 05/04/16 0548  NA 143 142 143 144 141  K 4.4 4.4 4.1 4.0 4.5  CL 111 112* 113* 109 112*  CO2 20* 22 22  --  21*  CREATININE 4.17* 3.84* 3.46* 3.50* 3.02*   LFT  Recent Labs  05/02/16 0410 05/04/16 0548  PROT 4.5* 5.1*  AST 12* 13*  ALT 15* 15*  ALKPHOS 59 34*  BILITOT 0.7 0.8   PT/INR  Recent Labs  05/03/16 0908  LABPROT 24.3*  INR 2.14   PANCREAS No results for input(s): LIPASE in the last 72 hours.       Studies/Results: No results found.  Medications: I have reviewed the patient's current medications.  Assessment/Plan: 1. Acute duodenal ulcer. Was actively bleeding and was treated 4 days ago. He does not seem  to have had any clinical bleeding and is not even had a bowel movement several days but has dropped his hemoglobin. It is possible the ulcer still bleeding a little but suspect there is still a factor of vasculitis causing a decrease in hemoglobin production. At this point I would continue on BID PPI, continue to observe carefully and follow, transfuse is needed and carefully monitor his stools for any signs of ongoing bleeding.   Lindley Hiney JR,Daly Whipkey L 05/04/2016, 7:38 AM  This note was created using voice recognition software. Minor errors may Have occurred unintentionally.  Pager: [phone] If no answer or after hours call [phone]

## 2016-05-04 NOTE — Progress Notes (Addendum)
[facility] PROGRESS NOTE  [name] [id number] DOB: 11/01/1940 DOA: 04/16/2016  PCP: Kandice Hams, MD  Brief History/Interval Summary: Nicholas Hudson is an 76 y.o. male with medical history significant of PBH, gout, recurrent sinus infection, hypothyroidism. Patient with history of sinus, polyps problems, infection for last 6 to 8 weeks, he has received 2 course of antibiotics by Dr Lucia Gaskins. Patient was found to have worsening renal function. He was hospitalized for further management. Patient seen by nephrology. Renal function  stabilized after a few sessions of dialysis. 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 Patient started on dialysis. Also started on plasmapheresis.   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 Joelyn Oms prior to all transfusions to monitor volume status  Appreciate  [facility]   evaluation EGD                 -  non-obstructing oozing duodenal ulcer with a                            visible vessel. Treatment not successful. Treated                            with bipolar cautery. Injected. Clip was placed.                                                  Dr Oletta Lamas recommended general surgery consult ,which has been completed Advance diet Patient has had about 5 units of packed red blood cells. Due to no significant improvement in hemoglobin, will PT/INR,  elevated No significant change in hemoglobin despite 6 units of packed red blood  cells According to GI  his risk of rebleeding fairly low and would continue him on oral Protonix   Anemia refractory to PRBC transfusion Status post transfusion of 6 units of packed red blood cells Initiate autoimmune workup, LDH, haptoglobin, coomb's test Consulted Dr. Alen Blew , to evaluate Hold off on further transfusions   Myeloperoxidase antibody positive ANCA glomerulonephritis/AKI Patient's acute kidney injury appears to have developed over the last 1-2 months. His creatinine was 1.0 about a year ago. Nephrology was consulted. Autoimmune workup is positive for an elevated myeloperoxidase antibody. 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  Renal biopsy was performed 2/7. 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 Completed pulse steroids;   continue prednisone 60 mg a day. .  Patient started on cyclophosphamide which is now on hold due to GI bleeding/anemia , continue  Bactrim for prophylaxis. Being dialyzed based on renal function and  urine output  Creatinine improving  4.4>4.9>4.63>4.54>3.84 >3.01 Also, on plasmapheresis -every other day Renal function stable, good uop k 5.8 , dr sanford notified , combination of steroids and bactrim, now resolved  with kayexalate  Off HD  For several days now   Sepsis of unclear source Culture data negative till date. Repeat chest x-ray is negative. Patient was initially started on the sepsis pathway. He was given vancomycin and Zosyn. Vancomycin was discontinued due to renal failure. No clear infectious foci. MRI brain did not show any acute abnormalities. MRI abdomen did not show any intra-abdominal process. Zosyn was discontinued as well. Patient could've had a viral syndrome. MRI brain did show paranasal sinus disease and mastoid fluid. However patient has recently completed multiple courses of antibiotics for same. Continue to monitor off of antibiotics. Sepsis physiology,  resolved. WBC is normal.   Metabolic encephalopathy Resolved. MRI brain did not show any acute process. Confusion could have been due to acute renal failure.   Elevated alkaline phosphatase Patient noted an elevated alkaline phosphatase. AST, ALT normal. Bilirubin normal. Abdominal ultrasound showed possible pancreatic mass. MRI did not show any lesion in the pancreas. Finding on the ultrasound was thought to be artifactual from bowel. Alkaline phosphatase is now back to normal  Hypertension stable- Patient does not seem to be on any blood pressure lowering agents at home. Blood pressure could be elevated due to effect of steroid. Continue to monitor for now.Will start prn hydralazine as needed   Hyperglycemia No history of diabetes as outpatient. Hyperglycemia is likely due to steroids. Since he is now just on oral steroids, we decreased the dose of his insulin. HbA1c was 6.5. Continue to monitor CBGs and adjust dose of insulin as needed.   Hypothyroidism Continue Synthroid.  Hyponatremia Has improved after dialysis.   DVT Prophylaxis: SCDs  Code Status: Full code  Family Communication: Discussed with patient, wife and daughter Disposition Plan: continue tele, hematology consultation    Consultants: Nephrology, gastroenterology,gen surgery, hematology/oncology  Procedures:  Renal biopsy 2/7  Placement of HD catheter 2/9  Echocardiogram Study Conclusions  - Left ventricle: The cavity size was normal. There was moderate   concentric hypertrophy. Systolic function was normal. The   estimated ejection fraction was in the range of 50% to 55%. Wall   motion was normal; there were no regional wall motion   abnormalities. Doppler parameters are consistent with abnormal   left ventricular relaxation (grade 1 diastolic dysfunction). - Aortic valve: Trileaflet; mildly thickened, mildly calcified   leaflets. - Aorta: Ascending aortic diameter: 42 mm (S). - Ascending  aorta: The ascending aorta was mildly dilated. - Mitral valve: There was mild regurgitation. - Left atrium: The atrium was mildly dilated.  Hemodialysis  Plasmapheresis starting 2/12  Antibiotics: Initially started on vancomycin and Zosyn. Both of these antibiotics have been discontinued.  Subjective/Interval History: No hematochezia   ROS: Denies any chest pain or shortness of breath  Objective:  Vital Signs  Vitals:   05/03/16 2115 05/03/16 2342 05/04/16 0411 05/04/16 0726  BP: (!) 145/72 137/85 128/75 (!) 142/68  Pulse: 73 (!) 59 64 68  Resp: 19 16 16 13   Temp: 98.9 F (37.2 C) 98.6 F (37 C) 97.5 F (36.4 C) 98.8 F (37.1 C)  TempSrc: Oral Oral Oral Oral  SpO2: 100% 98% 99% 99%  Weight:   80.6 kg (177 lb 11.2 oz)   Height:        Intake/Output Summary (Last 24 hours) at 05/04/16 0846 Last data filed at 05/04/16 0757  Gross per 24 hour  Intake              845 ml  Output  3151 ml  Net            -2306 ml   Filed Weights   05/02/16 0419 05/03/16 0500 05/04/16 0411  Weight: 80.5 kg (177 lb 6.4 oz) 81.3 kg (179 lb 3.2 oz) 80.6 kg (177 lb 11.2 oz)    General appearance: alert, cooperative, appears stated age and no distress Resp: clear to auscultation bilaterally Cardio: regular rate and rhythm, S1, S2 normal, no murmur, click, rub or gallop GI: soft, non-tender; bowel sounds normal; no masses,  no organomegaly Extremities: Does have some edema bilateral lower extremity Neurologic: Awake and alert. No focal deficits.  Lab Results:  Data Reviewed: I have personally reviewed following labs and imaging studies  CBC:  Recent Labs Lab 05/01/16 0209 05/02/16 0410 05/02/16 1515 05/03/16 0758 05/03/16 0836 05/04/16 0548  WBC 8.2 6.4 9.2 9.5  --  4.0  HGB 8.2* 7.9* 7.5* 7.7* 6.1* 7.4*  HCT 24.1* 23.6* 22.4* 23.0* 18.0* 22.2*  MCV 82.0 81.9 82.4 82.7  --  84.1  PLT 146* 135* 123* 124*  --  108*    Basic Metabolic Panel:  Recent Labs Lab  05/01/16 0209  05/01/16 1634 05/02/16 0410 05/02/16 0952 05/03/16 0758 05/03/16 0836 05/03/16 1016 05/04/16 0548  NA 142  --  143 142  --  143 144  --  141  K 5.8*  < > 4.4 4.4  --  4.1 4.0  --  4.5  CL 117*  --  111 112*  --  113* 109  --  112*  CO2 19*  --  20* 22  --  22  --   --  21*  GLUCOSE 145*  --  193* 178*  --  95 91  --  166*  BUN 93*  --  85* 81*  --  79* 85*  --  54*  CREATININE 4.54*  --  4.17* 3.84*  --  3.46* 3.50*  --  3.02*  CALCIUM 7.9*  --  7.7* 7.9*  --  8.0*  --   --  8.0*  MG  --   --   --   --  1.5*  --   --  1.7  --   < > = values in this interval not displayed.  GFR: Estimated Creatinine Clearance: 20 mL/min (by C-G formula based on SCr of 3.02 mg/dL (H)).  Liver Function Tests:  Recent Labs Lab 04/29/16 0629 04/30/16 0451 05/02/16 0410 05/04/16 0548  AST 13* 12* 12* 13*  ALT 13* 15* 15* 15*  ALKPHOS 47 49 59 34*  BILITOT 1.0 0.6 0.7 0.8  PROT 3.9* 4.3* 4.5* 5.1*  ALBUMIN 2.8* 2.9* 3.0* 4.0    CBG:  Recent Labs Lab 05/03/16 0721 05/03/16 1154 05/03/16 1712 05/03/16 2016 05/04/16 0722  GLUCAP 92 129* 106* 187* 147*     Recent Results (from the past 240 hour(s))  MRSA PCR Screening     Status: None   Collection Time: 04/26/16  2:02 PM  Result Value Ref Range Status   MRSA by PCR NEGATIVE NEGATIVE Final    Comment:        The GeneXpert MRSA Assay (FDA approved for NASAL specimens only), is one component of a comprehensive MRSA colonization surveillance program. It is not intended to diagnose MRSA infection nor to guide or monitor treatment for MRSA infections.   MRSA PCR Screening     Status: None   Collection Time: 04/29/16  4:42 PM  Result Value Ref Range Status  MRSA by PCR NEGATIVE NEGATIVE Final    Comment:        The GeneXpert MRSA Assay (FDA approved for NASAL specimens only), is one component of a comprehensive MRSA colonization surveillance program. It is not intended to diagnose MRSA infection nor to  guide or monitor treatment for MRSA infections.       Radiology Studies: No results found.   Medications:  Scheduled: . sodium chloride   Intravenous Once  . gabapentin  100 mg Oral QHS  . insulin aspart  0-15 Units Subcutaneous TID WC  . insulin aspart  0-5 Units Subcutaneous QHS  . insulin aspart  3 Units Subcutaneous TID WC  . levothyroxine  200 mcg Oral QAC breakfast  . magnesium sulfate 1 - 4 g bolus IVPB  2 g Intravenous Once  . metoprolol tartrate  12.5 mg Oral BID  . pantoprazole  40 mg Oral BID  . polyethylene glycol  17 g Oral BID  . predniSONE  60 mg Oral Q breakfast  . sodium bicarbonate  650 mg Oral BID  . sulfamethoxazole-trimethoprim  1 tablet Oral Q M,W,F-1800   Continuous: . citrate dextrose 500 mL (05/03/16 0858)   [id number] **OR** acetaminophen, acetaminophen, diphenhydrAMINE, hydrALAZINE, ondansetron **OR** ondansetron (ZOFRAN) IV, traMADol, zolpidem      LOS: 18 days   [facility]  [facility] Pager [phone] 05/04/2016, 8:46 AM  If 7PM-7AM, please contact night-coverage at [email or web address], password [facility]

## 2016-05-04 NOTE — Progress Notes (Signed)
[facility] Progress Note  5 Days Post-Op  Subjective: No abdominal pain. Pt had 2 BM's today that were black and tarry. No nausea or vomiting. No fevers. Wife at bedside. Answered their questions.   Objective: Vital signs in last 24 hours: Temp:  [97.5 F (36.4 C)-98.9 F (37.2 C)] 98 F (36.7 C) (02/21 1140) Pulse Rate:  [56-80] 56 (02/21 1140) Resp:  [13-19] 16 (02/21 1140) BP: (128-145)/(57-85) 139/61 (02/21 1140) SpO2:  [98 %-100 %] 100 % (02/21 1140) Weight:  [177 lb 11.2 oz (80.6 kg)] 177 lb 11.2 oz (80.6 kg) (02/21 0411) Last BM Date: 05/02/16  Intake/Output from previous day: 02/20 0701 - 02/21 0700 In: 845 [P.O.:480; Blood:365] Out: 2851 [Urine:2850; Stool:1] Intake/Output this shift: Total I/O In: -  Out: 600 [Urine:600]  PE: Constitutional: He is well-developed, well-nourished, and in no distress. No distress. Well appearing, pale, while male   Neck: Normal range of motion. Neck supple.  Cardiovascular: Normal rate, regular rhythm, S1 normal, S2 normal, normal heart sounds Exam reveals no gallop, friction rub or murmur.   Pulmonary/Chest: rate and Effort normal  Abdominal: Soft, obese. Bowel sounds are normal. mild distension. There is no tenderness. There is no rigidity, no rebound and no guarding.   Neurological: He is alert. GCS score is 15.  Skin: Skin is warm and dry. No rash noted. He is not diaphoretic.  Psychiatric: Mood and affect normal.  Nursing note and vitals reviewed.  Lab Results:   Recent Labs  05/03/16 0758 05/03/16 0836 05/04/16 0548  WBC 9.5  --  4.0  HGB 7.7* 6.1* 7.4*  HCT 23.0* 18.0* 22.2*  PLT 124*  --  108*   BMET  Recent Labs  05/03/16 0758 05/03/16 0836 05/04/16 0548  NA 143 144 141  K 4.1 4.0 4.5  CL 113* 109 112*  CO2 22  --  21*  GLUCOSE 95 91 166*  BUN 79* 85* 54*  CREATININE 3.46* 3.50* 3.02*  CALCIUM 8.0*  --  8.0*   PT/INR  Recent Labs  05/03/16 0908  LABPROT 24.3*  INR 2.14   CMP    Component Value Date/Time   NA 141 05/04/2016 0548   K 4.5 05/04/2016 0548   CL 112 (H) 05/04/2016 0548   CO2 21 (L) 05/04/2016 0548   GLUCOSE 166 (H) 05/04/2016 0548   BUN 54 (H) 05/04/2016 0548   CREATININE 3.02 (H) 05/04/2016 0548   CALCIUM 8.0 (L) 05/04/2016 0548   PROT 5.1 (L) 05/04/2016 0548   ALBUMIN 4.0 05/04/2016 0548   AST 13 (L) 05/04/2016 0548   ALT 15 (L) 05/04/2016 0548   ALKPHOS 34 (L) 05/04/2016 0548   BILITOT 0.8 05/04/2016 0548   GFRNONAA 19 (L) 05/04/2016 0548   GFRAA 21 (L) 05/04/2016 0548   Lipase  No results found for: LIPASE     Studies/Results: No results found.  Anti-infectives: Anti-infectives    Start     Dose/Rate Route Frequency Ordered Stop   04/25/16 1800  sulfamethoxazole-trimethoprim (BACTRIM DS,SEPTRA DS) 800-160 MG per tablet 1 tablet     1 tablet Oral Every M-W-F (1800) 04/23/16 1445     04/22/16 0926  ceFAZolin (ANCEF) 2-4 GM/100ML-% IVPB    Comments:  Leak, Brandi   : cabinet override      04/22/16 0926 04/22/16 0937   04/22/16 0715  ceFAZolin (ANCEF) IVPB 1 g/50 mL premix     1 g 100 mL/hr over 30 Minutes Intravenous To Radiology 04/22/16 0712 04/22/16 1012  04/19/16 1000  vancomycin (VANCOCIN) IVPB 1000 mg/200 mL premix  Status:  Discontinued     1,000 mg 200 mL/hr over 60 Minutes Intravenous Every 48 hours 04/17/16 0845 04/18/16 1654   04/17/16 1800  piperacillin-tazobactam (ZOSYN) IVPB 2.25 g  Status:  Discontinued     2.25 g 100 mL/hr over 30 Minutes Intravenous Every 8 hours 04/17/16 0845 04/20/16 1735   04/17/16 0815  piperacillin-tazobactam (ZOSYN) IVPB 2.25 g     2.25 g 100 mL/hr over 30 Minutes Intravenous  Once 04/17/16 0810 04/17/16 1058   04/17/16 0815  vancomycin (VANCOCIN) IVPB 1000 mg/200 mL premix     1,000 mg 200 mL/hr over 60 Minutes Intravenous  Once 04/17/16 0810 04/17/16 1122       Assessment/Plan  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 Pt will need to be scoped again before surgery is considered if it is thought he is still bleeding.  - We will continue to follow the patient until his hemoglobin stabilizes and platelets start to go up.   LOS: 18 days    Kalman Drape , [facility] 05/04/2016, 12:19 PM Pager: [phone] Consults: [phone] Mon-Fri 7:00 am-4:30 pm Sat-Sun 7:00 am-11:30 am

## 2016-05-05 LAB — IRON AND TIBC
IRON: 55 ug/dL (ref 45–182)
Saturation Ratios: 29 % (ref 17.9–39.5)
TIBC: 188 ug/dL — ABNORMAL LOW (ref 250–450)
UIBC: 133 ug/dL

## 2016-05-05 LAB — GLUCOSE, CAPILLARY
GLUCOSE-CAPILLARY: 101 mg/dL — AB (ref 65–99)
GLUCOSE-CAPILLARY: 191 mg/dL — AB (ref 65–99)
Glucose-Capillary: 153 mg/dL — ABNORMAL HIGH (ref 65–99)

## 2016-05-05 LAB — CBC
HCT: 21.7 % — ABNORMAL LOW (ref 39.0–52.0)
HEMOGLOBIN: 7.3 g/dL — AB (ref 13.0–17.0)
MCH: 28.4 pg (ref 26.0–34.0)
MCHC: 33.6 g/dL (ref 30.0–36.0)
MCV: 84.4 fL (ref 78.0–100.0)
PLATELETS: 113 10*3/uL — AB (ref 150–400)
RBC: 2.57 MIL/uL — ABNORMAL LOW (ref 4.22–5.81)
RDW: 17 % — ABNORMAL HIGH (ref 11.5–15.5)
WBC: 6 10*3/uL (ref 4.0–10.5)

## 2016-05-05 LAB — RENAL FUNCTION PANEL
ALBUMIN: 4 g/dL (ref 3.5–5.0)
Anion gap: 6 (ref 5–15)
BUN: 53 mg/dL — AB (ref 6–20)
CHLORIDE: 113 mmol/L — AB (ref 101–111)
CO2: 22 mmol/L (ref 22–32)
CREATININE: 2.89 mg/dL — AB (ref 0.61–1.24)
Calcium: 8.7 mg/dL — ABNORMAL LOW (ref 8.9–10.3)
GFR, EST AFRICAN AMERICAN: 23 mL/min — AB (ref >60)
GFR, EST NON AFRICAN AMERICAN: 20 mL/min — AB (ref >60)
Glucose, Bld: 106 mg/dL — ABNORMAL HIGH (ref 65–99)
PHOSPHORUS: 4 mg/dL (ref 2.5–4.6)
POTASSIUM: 4.2 mmol/L (ref 3.5–5.1)
Sodium: 141 mmol/L (ref 135–145)

## 2016-05-05 LAB — HAPTOGLOBIN: Haptoglobin: 80 mg/dL (ref 34–200)

## 2016-05-05 LAB — FERRITIN: FERRITIN: 386 ng/mL — AB (ref 24–336)

## 2016-05-05 LAB — RETICULOCYTES
RBC.: 2.75 MIL/uL — ABNORMAL LOW (ref 4.22–5.81)
RETIC COUNT ABSOLUTE: 52.3 10*3/uL (ref 19.0–186.0)
Retic Ct Pct: 1.9 % (ref 0.4–3.1)

## 2016-05-05 MED ORDER — CALCIUM CARBONATE ANTACID 500 MG PO CHEW
2.0000 | CHEWABLE_TABLET | ORAL | Status: AC
  Administered 2016-05-05: 400 mg via ORAL

## 2016-05-05 MED ORDER — ACETAMINOPHEN 325 MG PO TABS: 650.0000 mg | ORAL_TABLET | ORAL | Status: DC | PRN

## 2016-05-05 MED ORDER — HEPARIN SODIUM (PORCINE) 1000 UNIT/ML IJ SOLN
1000.0000 [IU] | Freq: Once | INTRAMUSCULAR | Status: AC
  Administered 2016-05-05: 1000 [IU]
  Filled 2016-05-05: qty 1

## 2016-05-05 MED ORDER — CALCIUM CARBONATE ANTACID 500 MG PO CHEW
CHEWABLE_TABLET | ORAL | Status: AC
  Filled 2016-05-05: qty 2

## 2016-05-05 MED ORDER — CALCIUM GLUCONATE 10 % IV SOLN
4.0000 g | Freq: Once | INTRAVENOUS | Status: AC
  Administered 2016-05-05: 4 g via INTRAVENOUS
  Filled 2016-05-05: qty 40

## 2016-05-05 MED ORDER — SODIUM CHLORIDE 0.9 % IV SOLN: Freq: Once | INTRAVENOUS | Status: DC

## 2016-05-05 MED ORDER — ACD FORMULA A 0.73-2.45-2.2 GM/100ML VI SOLN
500.0000 mL | Status: DC
  Administered 2016-05-05: 500 mL via INTRAVENOUS
  Filled 2016-05-05: qty 500

## 2016-05-05 MED ORDER — ACD FORMULA A 0.73-2.45-2.2 GM/100ML VI SOLN
Status: AC
  Filled 2016-05-05: qty 500

## 2016-05-05 MED ORDER — DIPHENHYDRAMINE HCL 25 MG PO CAPS: 25.0000 mg | ORAL_CAPSULE | Freq: Four times a day (QID) | ORAL | Status: DC | PRN

## 2016-05-05 MED ORDER — ALBUMIN HUMAN 25 % IV SOLN
INTRAVENOUS | Status: AC
  Administered 2016-05-05 (×5): via INTRAVENOUS_CENTRAL
  Filled 2016-05-05 (×5): qty 200

## 2016-05-05 MED ORDER — ALBUMIN HUMAN 25 % IV SOLN
INTRAVENOUS | Status: DC
  Filled 2016-05-05 (×5): qty 200

## 2016-05-05 NOTE — Progress Notes (Signed)
Lab results noted in the last 24 hours.  His hemoglobin remains stable at 7.3 with normal white cell count and stable platelets. His haptoglobin is normal at 80 and LDH 175. His Coombs testing is negative. Iron studies showed no evidence of iron deficiency at this time.  Based on these labs, I see no evidence of hemolysis or microangiopathy.  His anemia is likely combination of blood loss, renal insufficiency and marrow suppression from acute illness and Cytoxan. Bblood loss related to plasma exchange also can contribute.  I recommend continue supportive transfusions as needed if he becomes symptomatic or hemoglobin less than 7.  No further recommendations from a hematology standpoint at this time. Please call with questions if needed to.

## 2016-05-05 NOTE — Procedures (Signed)
I was present at this Therapeutic plasma exchange session. I have reviewed the session itself and made appropriate changes.   Filed Weights   05/04/16 0411 05/05/16 0401 05/05/16 0700  Weight: 80.6 kg (177 lb 11.2 oz) 80.1 kg (176 lb 8 oz) 80.6 kg (177 lb 11.1 oz)     Recent Labs Lab 05/04/16 0548  NA 141  K 4.5  CL 112*  CO2 21*  GLUCOSE 166*  BUN 54*  CREATININE 3.02*  CALCIUM 8.0*     Recent Labs Lab 05/03/16 0758 05/03/16 0836 05/04/16 0548 05/05/16 0341  WBC 9.5  --  4.0 6.0  HGB 7.7* 6.1* 7.4* 7.3*  HCT 23.0* 18.0* 22.2* 21.7*  MCV 82.7  --  84.1 84.4  PLT 124*  --  108* 113*    Scheduled Meds: . sodium chloride   Intravenous Once  . sodium chloride   Intravenous Once  . calcium carbonate      . calcium carbonate  2 tablet Oral Q3H  . calcium gluconate IVPB  4 g Intravenous Once  . citrate dextrose      . gabapentin  100 mg Oral QHS  . heparin  1,000 Units Intracatheter Once  . insulin aspart  0-15 Units Subcutaneous TID WC  . insulin aspart  0-5 Units Subcutaneous QHS  . insulin aspart  3 Units Subcutaneous TID WC  . levothyroxine  200 mcg Oral QAC breakfast  . metoprolol tartrate  12.5 mg Oral BID  . pantoprazole  40 mg Oral BID  . polyethylene glycol  17 g Oral BID  . predniSONE  60 mg Oral Q breakfast  . sodium bicarbonate  650 mg Oral BID  . sulfamethoxazole-trimethoprim  1 tablet Oral Q M,W,F-1800   Continuous Infusions: . citrate dextrose     PRN Meds:.acetaminophen **OR** acetaminophen, acetaminophen, diphenhydrAMINE, hydrALAZINE, ondansetron **OR** ondansetron (ZOFRAN) IV, traMADol, zolpidem   Donetta Potts,  MD 05/05/2016, 10:17 AM

## 2016-05-05 NOTE — Progress Notes (Signed)
Pt. States he can place cpap on himself. RT filled pt. Water chamber for him. No issues at this time.

## 2016-05-05 NOTE — Progress Notes (Signed)
Physical Therapy Treatment/Discharge Patient Details Name: Nicholas Hudson MRN: [id number] DOB: 06/17/1940 Today's Date: 05/05/2016    History of Present Illness 76 y.o. male with medical history significant of PBH, gout, recurrent sinus infection, hypothyroidism. Patient with history of sinus, polyps  problems, infection for last 6 to 8 weeks. pt with increased lethargy and confusion, renal function with mild leukocytosis and glomerulonephritis. HD initiated 04-22-16.    PT Comments    Pt has met goals and no longer requires acute PT services. Pt was able to return to mod-I, which was PLOF. Pt acknowledges no further needs is aware and agreeable. Pt noted to have higher level balance deficits and educated for safety. Pt encouraged to continue ambulation acutely. Pt would continue to benefit from current d/c plan. If further acute PT needs arise, please consult again. PT signing off.    Follow Up Recommendations  No PT follow up     Equipment Recommendations  None recommended by PT    Recommendations for Other Services       Precautions / Restrictions Restrictions Weight Bearing Restrictions: No    Mobility  Bed Mobility Overal bed mobility: Modified Independent                Transfers Overall transfer level: Modified independent Equipment used: None Transfers: Sit to/from Stand Sit to Stand: Modified independent (Device/Increase time)            Ambulation/Gait Ambulation/Gait assistance: Modified independent (Device/Increase time) Ambulation Distance (Feet): 350 Feet Assistive device: None Gait Pattern/deviations: Step-through pattern;Decreased stride length Gait velocity: slightly decreased Gait velocity interpretation: Below normal speed for age/gender     Stairs            Wheelchair Mobility    Modified Rankin (Stroke Patients Only)       Balance Overall balance assessment: Modified Independent Sitting-balance support: Feet supported;No  upper extremity supported Sitting balance-Leahy Scale: Normal Sitting balance - Comments: sat EOB x 1 min with no LOB   Standing balance support: No upper extremity supported;During functional activity Standing balance-Leahy Scale: Normal Standing balance comment: no AD used; able to don and doff robe without LOB Single Leg Stance - Right Leg:  (maintains for 10 seconds) Single Leg Stance - Left Leg:  (maintains for 10 seconds, slightly more sway than R LE)     Rhomberg - Eyes Opened: 30 seconds Rhomberg - Eyes Closed: 30 seconds High level balance activites: Head turns High Level Balance Comments: able to perform horizontal and vertical head turns without LOB or deviation from straight path    Cognition Arousal/Alertness: Awake/alert Behavior During Therapy: WFL for tasks assessed/performed Overall Cognitive Status: Within Functional Limits for tasks assessed                      Exercises      General Comments General comments (skin integrity, edema, etc.): slight edema in B LE, pt educated on performance of ankle pumps and walking to assist with reduction of edema      Pertinent Vitals/Pain Pain Assessment: No/denies pain    Home Living                      Prior Function            PT Goals (current goals can now be found in the care plan section) Acute Rehab PT Goals Patient Stated Goal: go home Progress towards PT goals: Goals met/education completed, patient discharged from PT  Frequency           PT Plan Current plan remains appropriate    Co-evaluation             End of Session Equipment Utilized During Treatment: Gait belt Activity Tolerance: Patient tolerated treatment well Patient left: in chair;with call bell/phone within reach;with family/visitor present Nurse Communication: Mobility status       Time: 1347-1405 PT Time Calculation (min) (ACUTE ONLY): 18 min  Charges:  $Gait Training: 8-22 mins                     G Codes:       Tracie Harrier 05-27-16, 2:30 PM   Tracie Harrier, SPT Acute Rehab SPT [phone]

## 2016-05-05 NOTE — Progress Notes (Signed)
[facility] PROGRESS NOTE  KEENEN HICKLING [id number] DOB: 05/14/1940 DOA: 04/16/2016  PCP: Kandice Hams, MD  Brief History/Interval Summary: Nicholas Hudson is an 76 y.o. male with medical history significant of PBH, gout, recurrent sinus infection, hypothyroidism. Patient with history of sinus, polyps problems, infection for last 6 to 8 weeks, he has received 2 course of antibiotics by Dr Lucia Gaskins. Patient was found to have worsening renal function. He was hospitalized for further management. Patient seen by nephrology. Renal function  stabilized after a few sessions of dialysis. 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 Patient started on dialysis. Also started on plasmapheresis.   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 Joelyn Oms prior to all transfusions to monitor volume status  Appreciate  [facility]   evaluation EGD                 -  non-obstructing oozing duodenal ulcer with a                            visible vessel. Treatment not successful. Treated                            with bipolar cautery. Injected. Clip was placed.                                                  Dr Oletta Lamas recommended general surgery consult ,which has been completed Advance diet Patient has had about 6 units of packed red blood cells. Due to no significant improvement in hemoglobin, hem/onc was consulted  According to GI  his risk of rebleeding fairly low and would continue  him on oral Protonix     Anemia refractory to PRBC transfusion Status post transfusion of 6 units of packed red blood cells His hemoglobin remains stable at 7.3 with normal white cell count and stable platelets. His haptoglobin is normal at 80 and LDH 175. His Coombs testing is negative. Iron studies showed no evidence of iron deficiency at this time. no evidence of hemolysis or microangiopathy. His anemia is likely combination of blood loss, renal insufficiency and marrow suppression from acute illness and Cytoxan.  blood loss related to plasma exchange also can contribute. continue supportive transfusions as needed if he becomes symptomatic or hemoglobin less than 7.    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 His creatinine was 1.0 about a year ago. Nephrology was consulted. Autoimmune workup is positive for an elevated myeloperoxidase antibody. 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  Renal biopsy was performed 2/7. 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 Completed pulse steroids;   continue prednisone 60 mg a day. .  Patient started  on cyclophosphamide which is now on hold due to GI bleeding/anemia , continue  Bactrim for prophylaxis. Being dialyzed based on renal function and  urine output  Creatinine improving  4.4>4.9>4.63>4.54>3.84 >3.01 Also, on plasmapheresis -every other day,last 2 sessions on 2/22 and 2/24.  Renal function stable, good uop k was 5.8 , dr sanford notified , combination of steroids and bactrim, now resolved  with kayexalate  Off HD  For several days now   Sepsis of unclear source Culture data negative till date. Repeat chest x-ray is negative. Patient was initially started on the sepsis pathway. He was given vancomycin and Zosyn. Vancomycin was discontinued due to renal failure. No clear  infectious foci. MRI brain did not show any acute abnormalities. MRI abdomen did not show any intra-abdominal process. Zosyn was discontinued as well. Patient could've had a viral syndrome. MRI brain did show paranasal sinus disease and mastoid fluid. However patient has recently completed multiple courses of antibiotics for same. Continue to monitor off of antibiotics. Sepsis physiology, resolved. WBC is normal.   Metabolic encephalopathy Resolved. MRI brain did not show any acute process. Confusion could have been due to acute renal failure.   Elevated alkaline phosphatase Patient noted an elevated alkaline phosphatase. AST, ALT normal. Bilirubin normal. Abdominal ultrasound showed possible pancreatic mass. MRI did not show any lesion in the pancreas. Finding on the ultrasound was thought to be artifactual from bowel. Alkaline phosphatase is now back to normal  Hypertension stable- Patient does not seem to be on any blood pressure lowering agents at home. Blood pressure could be elevated due to effect of steroid. Continue to monitor for now.Will start prn hydralazine as needed   Hyperglycemia No history of diabetes as outpatient. Hyperglycemia is likely due to steroids. Since he is now just on oral steroids, we decreased the dose of his insulin. HbA1c was 6.5. Continue to monitor CBGs and adjust dose of insulin as needed.   Hypothyroidism Continue Synthroid.  Hyponatremia Has improved after dialysis.   DVT Prophylaxis: SCDs  Code Status: Full code  Family Communication: Discussed with patient, wife and daughter Disposition Plan: continue tele, hematology consultation    Consultants: Nephrology, gastroenterology,gen surgery, hematology/oncology  Procedures:  Renal biopsy 2/7  Placement of HD catheter 2/9  Echocardiogram Study Conclusions  - Left ventricle: The cavity size was normal. There was moderate   concentric hypertrophy. Systolic function was normal. The    estimated ejection fraction was in the range of 50% to 55%. Wall   motion was normal; there were no regional wall motion   abnormalities. Doppler parameters are consistent with abnormal   left ventricular relaxation (grade 1 diastolic dysfunction). - Aortic valve: Trileaflet; mildly thickened, mildly calcified   leaflets. - Aorta: Ascending aortic diameter: 42 mm (S). - Ascending aorta: The ascending aorta was mildly dilated. - Mitral valve: There was mild regurgitation. - Left atrium: The atrium was mildly dilated.  Hemodialysis  Plasmapheresis starting 2/12  Antibiotics: Initially started on vancomycin and Zosyn. Both of these antibiotics have been discontinued.  Subjective/Interval History: No hematochezia   ROS: Denies any chest pain or shortness of breath  Objective:  Vital Signs  Vitals:   05/05/16 0745 05/05/16 0755 05/05/16 0810 05/05/16 0820  BP: (!) 137/59 (!) 124/59 (!) 135/56 (!) 123/52  Pulse: 63     Resp:  13 12 14   Temp: 98.6 F (37 C) 98.6 F (37 C)    TempSrc: Oral Oral    SpO2: 100% 100% 100% 100%  Weight:      Height:        Intake/Output Summary (Last 24 hours) at 05/05/16 0851 Last data filed at 05/05/16 [id number]  Gross per 24 hour  Intake              240 ml  Output             1775 ml  Net            -1535 ml   Filed Weights   05/04/16 0411 05/05/16 0401 05/05/16 0700  Weight: 80.6 kg (177 lb 11.2 oz) 80.1 kg (176 lb 8 oz) 80.6 kg (177 lb 11.1 oz)    General appearance: alert, cooperative, appears stated age and no distress Resp: clear to auscultation bilaterally Cardio: regular rate and rhythm, S1, S2 normal, no murmur, click, rub or gallop GI: soft, non-tender; bowel sounds normal; no masses,  no organomegaly Extremities: Does have some edema bilateral lower extremity Neurologic: Awake and alert. No focal deficits.  Lab Results:  Data Reviewed: I have personally reviewed following labs and imaging studies  CBC:  Recent Labs Lab  05/02/16 0410 05/02/16 1515 05/03/16 0758 05/03/16 0836 05/04/16 0548 05/05/16 0341  WBC 6.4 9.2 9.5  --  4.0 6.0  HGB 7.9* 7.5* 7.7* 6.1* 7.4* 7.3*  HCT 23.6* 22.4* 23.0* 18.0* 22.2* 21.7*  MCV 81.9 82.4 82.7  --  84.1 84.4  PLT 135* 123* 124*  --  108* 113*    Basic Metabolic Panel:  Recent Labs Lab 05/01/16 0209  05/01/16 1634 05/02/16 0410 05/02/16 0952 05/03/16 0758 05/03/16 0836 05/03/16 1016 05/04/16 0548  NA 142  --  143 142  --  143 144  --  141  K 5.8*  < > 4.4 4.4  --  4.1 4.0  --  4.5  CL 117*  --  111 112*  --  113* 109  --  112*  CO2 19*  --  20* 22  --  22  --   --  21*  GLUCOSE 145*  --  193* 178*  --  95 91  --  166*  BUN 93*  --  85* 81*  --  79* 85*  --  54*  CREATININE 4.54*  --  4.17* 3.84*  --  3.46* 3.50*  --  3.02*  CALCIUM 7.9*  --  7.7* 7.9*  --  8.0*  --   --  8.0*  MG  --   --   --   --  1.5*  --   --  1.7  --   < > = values in this interval not displayed.  GFR: Estimated Creatinine Clearance: 20 mL/min (by C-G formula based on SCr of 3.02 mg/dL (H)).  Liver Function Tests:  Recent Labs Lab 04/29/16 0629 04/30/16 0451 05/02/16 0410 05/04/16 0548  AST 13* 12* 12* 13*  ALT 13* 15* 15* 15*  ALKPHOS 47 49 59 34*  BILITOT 1.0 0.6 0.7 0.8  PROT 3.9* 4.3* 4.5* 5.1*  ALBUMIN 2.8* 2.9* 3.0* 4.0    CBG:  Recent Labs Lab 05/03/16 2016 05/04/16 0722 05/04/16 1138 05/04/16 1545 05/04/16 2228  GLUCAP 187* 147* 134* 128* 127*     Recent Results (from the past 240 hour(s))  MRSA PCR Screening     Status: None   Collection Time: 04/26/16  2:02 PM  Result Value Ref Range Status   MRSA by PCR NEGATIVE NEGATIVE Final    Comment:  The GeneXpert MRSA Assay (FDA approved for NASAL specimens only), is one component of a comprehensive MRSA colonization surveillance program. It is not intended to diagnose MRSA infection nor to guide or monitor treatment for MRSA infections.   MRSA PCR Screening     Status: None   Collection  Time: 04/29/16  4:42 PM  Result Value Ref Range Status   MRSA by PCR NEGATIVE NEGATIVE Final    Comment:        The GeneXpert MRSA Assay (FDA approved for NASAL specimens only), is one component of a comprehensive MRSA colonization surveillance program. It is not intended to diagnose MRSA infection nor to guide or monitor treatment for MRSA infections.       Radiology Studies: No results found.   Medications:  Scheduled: . sodium chloride   Intravenous Once  . sodium chloride   Intravenous Once  . therapeutic plasma exchange solution   Dialysis Q1 Hr x 5  . calcium carbonate      . calcium carbonate  2 tablet Oral Q3H  . calcium gluconate IVPB  4 g Intravenous Once  . citrate dextrose      . gabapentin  100 mg Oral QHS  . heparin  1,000 Units Intracatheter Once  . insulin aspart  0-15 Units Subcutaneous TID WC  . insulin aspart  0-5 Units Subcutaneous QHS  . insulin aspart  3 Units Subcutaneous TID WC  . levothyroxine  200 mcg Oral QAC breakfast  . metoprolol tartrate  12.5 mg Oral BID  . pantoprazole  40 mg Oral BID  . polyethylene glycol  17 g Oral BID  . predniSONE  60 mg Oral Q breakfast  . sodium bicarbonate  650 mg Oral BID  . sulfamethoxazole-trimethoprim  1 tablet Oral Q M,W,F-1800   Continuous: . citrate dextrose     [id number] **OR** acetaminophen, acetaminophen, diphenhydrAMINE, hydrALAZINE, ondansetron **OR** ondansetron (ZOFRAN) IV, traMADol, zolpidem      LOS: 19 days   [facility]  [facility] Pager [phone] 05/05/2016, 8:51 AM  If 7PM-7AM, please contact night-coverage at [email or web address], password [facility]

## 2016-05-05 NOTE — Care Management Important Message (Signed)
Important Message  Patient Details  Name: Nicholas Hudson MRN: [id number] Date of Birth: [birth year]   Medicare Important Message Given:  Yes    Nathen May 05/05/2016, 1:39 PM

## 2016-05-05 NOTE — Progress Notes (Signed)
[facility]/Nicholas Hudson Progress Note 6 Days Post-Op  Subjective: Patient minimally symptomatic.  No obvious bleedings   Objective: Vital signs in last 24 hours: Temp:  [97.8 F (36.6 C)-98.9 F (37.2 C)] 98.6 F (37 C) (02/22 0755) Pulse Rate:  [56-63] 63 (02/22 0745) Resp:  [12-16] 14 (02/22 0835) BP: (117-163)/(52-97) 126/55 (02/22 0835) SpO2:  [98 %-100 %] 100 % (02/22 0835) Weight:  [80.1 kg (176 lb 8 oz)-80.6 kg (177 lb 11.1 oz)] 80.6 kg (177 lb 11.1 oz) (02/22 0700) Last BM Date: 05/04/16  Intake/Output from previous day: 02/21 0701 - 02/22 0700 In: 240 [P.O.:240] Out: 2175 [Urine:2175] Intake/Output this shift: Total I/O In: -  Out: 200 [Urine:200]  General: No distress  Lungs: Clear  Abd: Benign  Extremities: No changes  Neuro: Intact  Lab Results:  @LABLAST2 (wbc:2,hgb:2,hct:2,plt:2) BMET ) Recent Labs  05/03/16 0758 05/03/16 0836 05/04/16 0548  NA 143 144 141  K 4.1 4.0 4.5  CL 113* 109 112*  CO2 22  --  21*  GLUCOSE 95 91 166*  BUN 79* 85* 54*  CREATININE 3.46* 3.50* 3.02*  CALCIUM 8.0*  --  8.0*   PT/INR  Recent Labs  05/03/16 0908  LABPROT 24.3*  INR 2.14   ABG No results for input(s): PHART, HCO3 in the last 72 hours.  Invalid input(s): PCO2, PO2  Studies/Results: No results found.  Anti-infectives: Anti-infectives    Start     Dose/Rate Route Frequency Ordered Stop   04/25/16 1800  sulfamethoxazole-trimethoprim (BACTRIM DS,SEPTRA DS) 800-160 MG per tablet 1 tablet     1 tablet Oral Every M-W-F (1800) 04/23/16 1445     04/22/16 0926  ceFAZolin (ANCEF) 2-4 GM/100ML-% IVPB    Comments:  Leak, Brandi   : cabinet override      04/22/16 0926 04/22/16 0937   04/22/16 0715  ceFAZolin (ANCEF) IVPB 1 g/50 mL premix     1 g 100 mL/hr over 30 Minutes Intravenous To Radiology 04/22/16 0712 04/22/16 1012   04/19/16 1000  vancomycin (VANCOCIN) IVPB 1000 mg/200 mL premix  Status:  Discontinued     1,000 mg 200 mL/hr over 60 Minutes Intravenous  Every 48 hours 04/17/16 0845 04/18/16 1654   04/17/16 1800  piperacillin-tazobactam (ZOSYN) IVPB 2.25 g  Status:  Discontinued     2.25 g 100 mL/hr over 30 Minutes Intravenous Every 8 hours 04/17/16 0845 04/20/16 1735   04/17/16 0815  piperacillin-tazobactam (ZOSYN) IVPB 2.25 g     2.25 g 100 mL/hr over 30 Minutes Intravenous  Once 04/17/16 0810 04/17/16 1058   04/17/16 0815  vancomycin (VANCOCIN) IVPB 1000 mg/200 mL premix     1,000 mg 200 mL/hr over 60 Minutes Intravenous  Once 04/17/16 0810 04/17/16 1122      Assessment/Plan: s/p Procedure(s): ESOPHAGOGASTRODUODENOSCOPY (EGD) Hemoglobin stable currently.  Will sign off  Reconsult [place] if the patient should start to bleed and it cannot be controlled medically or endoscopically.  LOS: 19 days   Kathryne Eriksson. Dahlia Bailiff, MD, FACS [phone] [facility] 05/05/2016

## 2016-05-05 NOTE — Progress Notes (Signed)
Patient ID: Nicholas Hudson, male   DOB: 1940-11-14, 76 y.o.   MRN: [id number] S:Feels better O:BP (!) 163/73 (BP Location: Right Arm)   Pulse 60   Temp 98.4 F (36.9 C) (Oral)   Resp 16   Ht 5\' 5"  (1.651 m)   Wt 80.1 kg (176 lb 8 oz)   SpO2 100%   BMI 29.37 kg/m   Intake/Output Summary (Last 24 hours) at 05/05/16 0813 Last data filed at 05/05/16 [id number]  Gross per 24 hour  Intake              240 ml  Output             1575 ml  Net            -1335 ml   Intake/Output: I/O last 3 completed shifts: In: 575 [P.O.:240; Blood:335] Out: [id number] [Urine:3825]  Intake/Output this shift:  No intake/output data recorded. Weight change: -0.544 kg (-1 lb 3.2 oz) Gen:WD WM in NAD CVS:no rub Resp:decreased BS [id number] Ext:1-2+ edema   Recent Labs Lab 04/29/16 0629 04/30/16 0451 05/01/16 0209 05/01/16 0824 05/01/16 1634 05/02/16 0410 05/03/16 0758 05/03/16 0836 05/04/16 0548  NA 138 142 142  --  143 142 143 144 141  K 4.5 4.9 5.8* 5.0 4.4 4.4 4.1 4.0 4.5  CL 110 114* 117*  --  111 112* 113* 109 112*  CO2 19* 18* 19*  --  20* 22 22  --  21*  GLUCOSE 112* 142* 145*  --  193* 178* 95 91 166*  BUN 71* 90* 93*  --  85* 81* 79* 85* 54*  CREATININE 4.88* 4.63* 4.54*  --  4.17* 3.84* 3.46* 3.50* 3.02*  ALBUMIN 2.8* 2.9*  --   --   --  3.0*  --   --  4.0  CALCIUM 7.5* 7.9* 7.9*  --  7.7* 7.9* 8.0*  --  8.0*  AST 13* 12*  --   --   --  12*  --   --  13*  ALT 13* 15*  --   --   --  15*  --   --  15*   Liver Function Tests:  Recent Labs Lab 04/30/16 0451 05/02/16 0410 05/04/16 0548  AST 12* 12* 13*  ALT 15* 15* 15*  ALKPHOS 49 59 34*  BILITOT 0.6 0.7 0.8  PROT 4.3* 4.5* 5.1*  ALBUMIN 2.9* 3.0* 4.0   No results for input(s): LIPASE, AMYLASE in the last 168 hours. No results for input(s): AMMONIA in the last 168 hours. CBC:  Recent Labs Lab 05/02/16 0410 05/02/16 1515 05/03/16 0758 05/03/16 0836 05/04/16 0548 05/05/16 0341  WBC 6.4 9.2 9.5  --  4.0 6.0  HGB 7.9* 7.5*  7.7* 6.1* 7.4* 7.3*  HCT 23.6* 22.4* 23.0* 18.0* 22.2* 21.7*  MCV 81.9 82.4 82.7  --  84.1 84.4  PLT 135* 123* 124*  --  108* 113*   Cardiac Enzymes:  Recent Labs Lab 05/02/16 0952 05/02/16 1500  TROPONINI <0.03 <0.03   CBG:  Recent Labs Lab 05/03/16 2016 05/04/16 0722 05/04/16 1138 05/04/16 1545 05/04/16 2228  GLUCAP 187* 147* 134* 128* 127*    Iron Studies:  Recent Labs  05/05/16 0341  IRON 55  TIBC 188*  FERRITIN 386*   Studies/Results: No results found. . sodium chloride   Intravenous Once  . sodium chloride   Intravenous Once  . therapeutic plasma exchange solution   Dialysis Q1 Hr x 5  . calcium carbonate      .  calcium carbonate  2 tablet Oral Q3H  . calcium gluconate IVPB  4 g Intravenous Once  . citrate dextrose      . gabapentin  100 mg Oral QHS  . heparin  1,000 Units Intracatheter Once  . insulin aspart  0-15 Units Subcutaneous TID WC  . insulin aspart  0-5 Units Subcutaneous QHS  . insulin aspart  3 Units Subcutaneous TID WC  . levothyroxine  200 mcg Oral QAC breakfast  . metoprolol tartrate  12.5 mg Oral BID  . pantoprazole  40 mg Oral BID  . polyethylene glycol  17 g Oral BID  . predniSONE  60 mg Oral Q breakfast  . sodium bicarbonate  650 mg Oral BID  . sulfamethoxazole-trimethoprim  1 tablet Oral Q M,W,F-1800    BMET    Component Value Date/Time   NA 141 05/04/2016 0548   K 4.5 05/04/2016 0548   CL 112 (H) 05/04/2016 0548   CO2 21 (L) 05/04/2016 0548   GLUCOSE 166 (H) 05/04/2016 0548   BUN 54 (H) 05/04/2016 0548   CREATININE 3.02 (H) 05/04/2016 0548   CALCIUM 8.0 (L) 05/04/2016 0548   GFRNONAA 19 (L) 05/04/2016 0548   GFRAA 21 (L) 05/04/2016 0548   CBC    Component Value Date/Time   WBC 6.0 05/05/2016 0341   RBC 2.75 (L) 05/05/2016 0630   RBC 2.57 (L) 05/05/2016 0341   HGB 7.3 (L) 05/05/2016 0341   HCT 21.7 (L) 05/05/2016 0341   PLT 113 (L) 05/05/2016 0341   MCV 84.4 05/05/2016 0341   MCH 28.4 05/05/2016 0341   MCHC  33.6 05/05/2016 0341   RDW 17.0 (H) 05/05/2016 0341   LYMPHSABS 1.0 04/17/2016 0823   MONOABS 0.8 04/17/2016 0823   EOSABS 0.1 04/17/2016 0823   BASOSABS 0.0 04/17/2016 0823     Assessment/Plan:  1. 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 S/p renal bx 04/20/16 with pauci immune gn 40% crescents, ATN, minimal scarring 2. S/p pulse solumedrol now on prednisone 60mg  daily 3. Po cytoxan on hold for GI bleed until next week 4. TPE 1.5 PV x 7 qod, albumin colloid on 2/14, 2/19, 2/20, and for last 2 on 2/22 and 2/24.  2. UGIB 04/28/16 due to duodenal ulcer s/p clipping and on PPI 3. ABLA/anemia of chronic disease 4. hyperkalemia 5. DM 6. HTN- stable  Donetta Potts, MD [employer] [phone]

## 2016-05-06 LAB — PREPARE FRESH FROZEN PLASMA
BLOOD PRODUCT EXPIRATION DATE: 201802272359
BLOOD PRODUCT EXPIRATION DATE: 201802272359
Blood Product Expiration Date: 201802272359
Blood Product Expiration Date: 201802272359
ISSUE DATE / TIME: 201802220904
ISSUE DATE / TIME: 201802220904
ISSUE DATE / TIME: 201802220904
ISSUE DATE / TIME: 201802220904
UNIT TYPE AND RH: 6200
Unit Type and Rh: 6200
Unit Type and Rh: 6200
Unit Type and Rh: 6200

## 2016-05-06 LAB — BASIC METABOLIC PANEL
Anion gap: 6 (ref 5–15)
BUN: 43 mg/dL — AB (ref 6–20)
CO2: 23 mmol/L (ref 22–32)
CREATININE: 2.74 mg/dL — AB (ref 0.61–1.24)
Calcium: 8.8 mg/dL — ABNORMAL LOW (ref 8.9–10.3)
Chloride: 112 mmol/L — ABNORMAL HIGH (ref 101–111)
GFR calc Af Amer: 24 mL/min — ABNORMAL LOW (ref >60)
GFR, EST NON AFRICAN AMERICAN: 21 mL/min — AB (ref >60)
Glucose, Bld: 146 mg/dL — ABNORMAL HIGH (ref 65–99)
Potassium: 4.1 mmol/L (ref 3.5–5.1)
SODIUM: 141 mmol/L (ref 135–145)

## 2016-05-06 LAB — CBC
HCT: 19 % — ABNORMAL LOW (ref 39.0–52.0)
Hemoglobin: 6.3 g/dL — CL (ref 13.0–17.0)
MCH: 28.8 pg (ref 26.0–34.0)
MCHC: 33.2 g/dL (ref 30.0–36.0)
MCV: 86.8 fL (ref 78.0–100.0)
PLATELETS: 91 10*3/uL — AB (ref 150–400)
RBC: 2.19 MIL/uL — AB (ref 4.22–5.81)
RDW: 17.2 % — ABNORMAL HIGH (ref 11.5–15.5)
WBC: 5 10*3/uL (ref 4.0–10.5)

## 2016-05-06 LAB — PREPARE RBC (CROSSMATCH)

## 2016-05-06 LAB — GLUCOSE, CAPILLARY
GLUCOSE-CAPILLARY: 132 mg/dL — AB (ref 65–99)
GLUCOSE-CAPILLARY: 173 mg/dL — AB (ref 65–99)
GLUCOSE-CAPILLARY: 199 mg/dL — AB (ref 65–99)
Glucose-Capillary: 98 mg/dL (ref 65–99)

## 2016-05-06 MED ORDER — SODIUM CHLORIDE 0.9 % IV SOLN
Freq: Once | INTRAVENOUS | Status: AC
  Administered 2016-05-06: 11:00:00 via INTRAVENOUS

## 2016-05-06 MED ORDER — SODIUM CHLORIDE 0.9 % IV SOLN
INTRAVENOUS | Status: DC
  Administered 2016-05-07: 06:00:00 via INTRAVENOUS

## 2016-05-06 MED ORDER — POLYETHYLENE GLYCOL 3350 17 G PO PACK
17.0000 g | PACK | Freq: Three times a day (TID) | ORAL | Status: DC
  Administered 2016-05-06 – 2016-05-12 (×15): 17 g via ORAL
  Filled 2016-05-06 (×15): qty 1

## 2016-05-06 MED ORDER — SODIUM CHLORIDE 0.9 % IV SOLN: Freq: Once | INTRAVENOUS | Status: DC

## 2016-05-06 NOTE — Progress Notes (Signed)
[facility] PROGRESS NOTE Subjective patient's hemoglobin dropped to 6.3 this morning. He went 2 days without a bowel movement despite taking Miralax and had a large bowel movement this morning that was still dark area he does not feel weaker dizzy. Vital signs are okay. He is receiving unit of blood.  Objective: Vital signs in last 24 hours: Temp:  [97 F (36.1 C)-98.6 F (37 C)] 97.7 F (36.5 C) (02/23 0645) Pulse Rate:  [59-74] 62 (02/23 0645) Resp:  [12-22] 18 (02/23 0645) BP: (112-144)/(52-66) 144/66 (02/23 0645) SpO2:  [98 %-100 %] 100 % (02/23 0645) Weight:  [82 kg (180 lb 11.2 oz)] 82 kg (180 lb 11.2 oz) (02/23 0653) Last BM Date: 05/04/16  Intake/Output from previous day: 02/22 0701 - 02/23 0700 In: 680 [P.O.:680] Out: 2850 [Urine:2850] Intake/Output this shift: No intake/output data recorded.    Lab Results:  Recent Labs  05/03/16 0758 05/03/16 0836 05/04/16 0548 05/05/16 0341 05/06/16 0351  WBC 9.5  --  4.0 6.0 5.0  HGB 7.7* 6.1* 7.4* 7.3* 6.3*  HCT 23.0* 18.0* 22.2* 21.7* 19.0*  PLT 124*  --  108* 113* 91*   BMET  Recent Labs  05/03/16 0758 05/03/16 0836 05/04/16 0548 05/05/16 0341 05/06/16 0351  NA 143 144 141 141 141  K 4.1 4.0 4.5 4.2 4.1  CL 113* 109 112* 113* 112*  CO2 22  --  21* 22 23  CREATININE 3.46* 3.50* 3.02* 2.89* 2.74*   LFT  Recent Labs  05/04/16 0548  PROT 5.1*  AST 13*  ALT 15*  ALKPHOS 34*  BILITOT 0.8   PT/INR  Recent Labs  05/03/16 0908  LABPROT 24.3*  INR 2.14   PANCREAS No results for input(s): LIPASE in the last 72 hours.       Studies/Results: No results found.  Medications: I have reviewed the patient's current medications.  Assessment/Plan: 1. Bleeding duodenal ulcer. The patient had an arterial bleed a week ago and may still be using from the ulcer. He really has not been passing much stool despite the use of Miralax. It's not clear if he is still having ongoing bleeding, or if  this is multifactorial related to his hematologic and vascular disease in conjunction with slow bleeding from the ulcer. I don't feel that he has an arterial bleed like he did last week. At this point we will increase his Miralax and will go ahead and plan on repeat EGD in the morning. If he clearly stops bleeding we can always cancel it. We will keep him NPO in the morning just in case. Have discussed this with the patient he is agreeable.   Ichelle Harral JR,Khala Tarte L 05/06/2016, 7:14 AM  This note was created using voice recognition software. Minor errors may Have occurred unintentionally.  Pager: [phone] If no answer or after hours call [phone]

## 2016-05-06 NOTE — Progress Notes (Signed)
Patient ID: MANCE BRANSCOMB, male   DOB: 14-Jun-1940, 76 y.o.   MRN: [id number] S:Feels well O:BP (P) 137/66   Pulse (!) (P) 54   Temp (P) 98 F (36.7 C) (Oral)   Resp 18   Ht 5\' 5"  (1.651 m)   Wt 82 kg (180 lb 11.2 oz)   SpO2 (P) 100%   BMI 30.07 kg/m   Intake/Output Summary (Last 24 hours) at 05/06/16 0836 Last data filed at 05/06/16 0400  Gross per 24 hour  Intake              680 ml  Output             2650 ml  Net            -1970 ml   Intake/Output: I/O last 3 completed shifts: In: [id number] [P.O.:920] Out: 4025 [Urine:4025]  Intake/Output this shift:  No intake/output data recorded. Weight change: 1.905 kg (4 lb 3.2 oz) Gen:WD WN WM in NAD CVS:no rub Resp:decreased BS at bases [id number] Ext: trace-1+ edema   Recent Labs Lab 04/30/16 0451 05/01/16 0209  05/01/16 1634 05/02/16 0410 05/03/16 0758 05/03/16 0836 05/04/16 0548 05/05/16 0341 05/06/16 0351  NA 142 142  --  143 142 143 144 141 141 141  K 4.9 5.8*  < > 4.4 4.4 4.1 4.0 4.5 4.2 4.1  CL 114* 117*  --  111 112* 113* 109 112* 113* 112*  CO2 18* 19*  --  20* 22 22  --  21* 22 23  GLUCOSE 142* 145*  --  193* 178* 95 91 166* 106* 146*  BUN 90* 93*  --  85* 81* 79* 85* 54* 53* 43*  CREATININE 4.63* 4.54*  --  4.17* 3.84* 3.46* 3.50* 3.02* 2.89* 2.74*  ALBUMIN 2.9*  --   --   --  3.0*  --   --  4.0 4.0  --   CALCIUM 7.9* 7.9*  --  7.7* 7.9* 8.0*  --  8.0* 8.7* 8.8*  PHOS  --   --   --   --   --   --   --   --  4.0  --   AST 12*  --   --   --  12*  --   --  13*  --   --   ALT 15*  --   --   --  15*  --   --  15*  --   --   < > = values in this interval not displayed. Liver Function Tests:  Recent Labs Lab 04/30/16 0451 05/02/16 0410 05/04/16 0548 05/05/16 0341  AST 12* 12* 13*  --   ALT 15* 15* 15*  --   ALKPHOS 49 59 34*  --   BILITOT 0.6 0.7 0.8  --   PROT 4.3* 4.5* 5.1*  --   ALBUMIN 2.9* 3.0* 4.0 4.0   No results for input(s): LIPASE, AMYLASE in the last 168 hours. No results for input(s):  AMMONIA in the last 168 hours. CBC:  Recent Labs Lab 05/02/16 1515 05/03/16 0758  05/04/16 0548 05/05/16 0341 05/06/16 0351  WBC 9.2 9.5  --  4.0 6.0 5.0  HGB 7.5* 7.7*  < > 7.4* 7.3* 6.3*  HCT 22.4* 23.0*  < > 22.2* 21.7* 19.0*  MCV 82.4 82.7  --  84.1 84.4 86.8  PLT 123* 124*  --  108* 113* 91*  < > = values in this interval not displayed. Cardiac Enzymes:  Recent Labs Lab 05/02/16 0952 05/02/16 1500  TROPONINI <0.03 <0.03   CBG:  Recent Labs Lab 05/04/16 2228 05/05/16 1150 05/05/16 1545 05/05/16 2035 05/06/16 0742  GLUCAP 127* 101* 191* 153* 98    Iron Studies:  Recent Labs  05/05/16 0341  IRON 55  TIBC 188*  FERRITIN 386*   Studies/Results: No results found. . sodium chloride   Intravenous Once  . sodium chloride   Intravenous Once  . sodium chloride   Intravenous Once  . gabapentin  100 mg Oral QHS  . insulin aspart  0-15 Units Subcutaneous TID WC  . insulin aspart  0-5 Units Subcutaneous QHS  . insulin aspart  3 Units Subcutaneous TID WC  . levothyroxine  200 mcg Oral QAC breakfast  . metoprolol tartrate  12.5 mg Oral BID  . pantoprazole  40 mg Oral BID  . polyethylene glycol  17 g Oral TID  . predniSONE  60 mg Oral Q breakfast  . sodium bicarbonate  650 mg Oral BID  . sulfamethoxazole-trimethoprim  1 tablet Oral Q M,W,F-1800    BMET    Component Value Date/Time   NA 141 05/06/2016 0351   K 4.1 05/06/2016 0351   CL 112 (H) 05/06/2016 0351   CO2 23 05/06/2016 0351   GLUCOSE 146 (H) 05/06/2016 0351   BUN 43 (H) 05/06/2016 0351   CREATININE 2.74 (H) 05/06/2016 0351   CALCIUM 8.8 (L) 05/06/2016 0351   GFRNONAA 21 (L) 05/06/2016 0351   GFRAA 24 (L) 05/06/2016 0351   CBC    Component Value Date/Time   WBC 5.0 05/06/2016 0351   RBC 2.19 (L) 05/06/2016 0351   HGB 6.3 (LL) 05/06/2016 0351   HCT 19.0 (L) 05/06/2016 0351   PLT 91 (L) 05/06/2016 0351   MCV 86.8 05/06/2016 0351   MCH 28.8 05/06/2016 0351   MCHC 33.2 05/06/2016 0351   RDW  17.2 (H) 05/06/2016 0351   LYMPHSABS 1.0 04/17/2016 0823   MONOABS 0.8 04/17/2016 0823   EOSABS 0.1 04/17/2016 0823   BASOSABS 0.0 04/17/2016 0823     Assessment/Plan:  1. 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 S/p renal bx 04/20/16 with pauci immune gn 40% crescents, ATN, minimal scarring 2. S/p pulse solumedrol now on prednisone 60mg  daily 3. Po cytoxan on hold for GI bleed until next week 4. TPE 1.5 PV x 7 qod, albumin colloid on 2/14, 2/19, 2/20, 2/22, and for last 2 tx's on 2/24 and 2/26. 2. UGIB 04/28/16 due to duodenal ulcer s/p clipping and on PPI 1. Continues to drop with plans to transfuse and may need another EGD. 3. ABLA/anemia of chronic disease 1. Transfuse again for hgb drop to 6.3 2. Appreciate Heme input 4. Hyperkalemia- resolved 5. DM 6. HTN- stable  Donetta Potts, MD [employer] [phone]

## 2016-05-06 NOTE — Progress Notes (Addendum)
[facility] PROGRESS NOTE  Nicholas Hudson [id number] DOB: 1940-10-30 DOA: 04/16/2016  PCP: Kandice Hams, MD  Brief History/Interval Summary: Nicholas Hudson is an 76 y.o. male with medical history significant of PBH, gout, recurrent sinus infection, hypothyroidism. Patient with history of sinus, polyps problems, infection for last 6 to 8 weeks, he has received 2 course of antibiotics by Dr Lucia Gaskins. Patient was found to have worsening renal function. He was hospitalized for further management. Patient seen by nephrology. Renal function  stabilized after a few sessions of dialysis. 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 Patient started on dialysis. Also started on plasmapheresis.   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 Treatment not successful. Treated                            with bipolar cautery. Injected. Clip was placed.                                                  Dr Oletta Lamas recommended general surgery consult ,which has been completed He also recommends repeat EGD tomorrow 2/24 for further evaluation of his anemia Hematology oncology has also been consulted this admission for refractory  anemia,r/o for hemolytic,none marrow issues        Anemia refractory to PRBC transfusion Status post transfusion of 7 units of packed red blood cells Hematology oncology consulted, according to the recommendations, His haptoglobin is normal at 80 and LDH 175. His Coombs testing is negative. Iron studies showed no evidence of iron deficiency at this time. no evidence of hemolysis or microangiopathy. His anemia is likely combination of blood loss, renal insufficiency and marrow suppression from acute illness and Cytoxan.  blood loss related to plasma exchange also can contribute. continue supportive transfusions as needed if he becomes symptomatic or hemoglobin less than 7. We will transfuse 1 more unit today. 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 His creatinine was 1.0 about a year ago. Nephrology was consulted. Autoimmune workup is positive for an elevated myeloperoxidase antibody. 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  Renal biopsy was performed 2/7. 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 Completed pulse steroids;   continue prednisone 60 mg a day. .  Patient started on cyclophosphamide which is now on hold due to GI bleeding/anemia , continue  Bactrim for prophylaxis. Being dialyzed based on renal function and  urine output  Creatinine improving  4.4>4.9>4.63>4.54>3.84 >3.01>2.74 Also, on plasmapheresis -every other day,last 2 sessions on 2/22 and 2/24.  Renal function stable, good uop, brief episode of hyperkalemia treated this admission with kayexalate  Off HD  For several days now    Sepsis of unclear source, resolved  Culture data negative till date. Repeat chest x-ray is negative.  Patient was initially started on the sepsis pathway. He was given vancomycin and Zosyn. Vancomycin was discontinued due to renal failure. No clear infectious foci. MRI brain did not show any acute abnormalities. MRI abdomen did not show any intra-abdominal process. Zosyn was discontinued as well. Patient could've had a viral syndrome. MRI brain did show paranasal sinus disease and mastoid fluid. However patient has recently completed multiple courses of antibiotics for same. Continue to monitor off of antibiotics. Sepsis physiology, resolved. WBC is normal.   Metabolic encephalopathy Resolved. MRI brain did not show any acute process. Confusion could have been due to acute renal failure.   Elevated alkaline phosphatase Patient noted an elevated alkaline phosphatase. AST, ALT normal. Bilirubin normal. Abdominal ultrasound showed possible pancreatic mass. MRI did not show any lesion in the pancreas. Finding on the ultrasound was thought to be artifactual from bowel. Alkaline phosphatase is now back to normal  Hypertension stable- Patient does not seem to be on any blood pressure lowering agents at home. Blood pressure could be elevated due to effect of steroid. Continue to monitor for now.Will start prn hydralazine as needed   Hyperglycemia No history of diabetes as outpatient. Hyperglycemia is likely due to steroids. Since he is now just on oral steroids, we decreased the dose of his insulin. HbA1c was 6.5. Continue to monitor CBGs and adjust dose of insulin as needed.   Hypothyroidism Continue Synthroid.  Hyponatremia Has improved after dialysis.   DVT Prophylaxis: SCDs  Code Status: Full code  Family Communication: Discussed with patient, wife and daughter Disposition Plan: continue tele, EGD tomorrow    Consultants: Nephrology, gastroenterology,gen surgery, hematology/oncology  Procedures:  Renal biopsy 2/7  Placement of HD catheter 2/9  Echocardiogram Study  Conclusions  - Left ventricle: The cavity size was normal. There was moderate   concentric hypertrophy. Systolic function was normal. The   estimated ejection fraction was in the range of 50% to 55%. Wall   motion was normal; there were no regional wall motion   abnormalities. Doppler parameters are consistent with abnormal   left ventricular relaxation (grade 1 diastolic dysfunction). - Aortic valve: Trileaflet; mildly thickened, mildly calcified   leaflets. - Aorta: Ascending aortic diameter: 42 mm (S). - Ascending aorta: The ascending aorta was mildly dilated. - Mitral valve: There was mild regurgitation. - Left atrium: The atrium was mildly dilated.  Hemodialysis  Plasmapheresis starting 2/12  Antibiotics: Initially started on vancomycin and Zosyn. Both of these antibiotics have been discontinued.  Subjective/Interval History: Still having dark colored stools    ROS: Denies any chest pain or shortness of breath  Objective:  Vital Signs  Vitals:   05/06/16 0401 05/06/16 0645 05/06/16 0653 05/06/16 0715  BP: (!) 119/54 (!) 144/66  137/66  Pulse: 70 62  (!) 54  Resp:  18    Temp: 98 F (36.7 C) 97.7 F (36.5  C)  98 F (36.7 C)  TempSrc: Oral Oral  Oral  SpO2: 100% 100%  100%  Weight:   82 kg (180 lb 11.2 oz)   Height:        Intake/Output Summary (Last 24 hours) at 05/06/16 1002 Last data filed at 05/06/16 0400  Gross per 24 hour  Intake              680 ml  Output             2650 ml  Net            -1970 ml   Filed Weights   05/05/16 0401 05/05/16 0700 05/06/16 0653  Weight: 80.1 kg (176 lb 8 oz) 80.6 kg (177 lb 11.1 oz) 82 kg (180 lb 11.2 oz)    General appearance: alert, cooperative, appears stated age and no distress Resp: clear to auscultation bilaterally Cardio: regular rate and rhythm, S1, S2 normal, no murmur, click, rub or gallop GI: soft, non-tender; bowel sounds normal; no masses,  no organomegaly Extremities: Does have some edema bilateral  lower extremity Neurologic: Awake and alert. No focal deficits.  Lab Results:  Data Reviewed: I have personally reviewed following labs and imaging studies  CBC:  Recent Labs Lab 05/02/16 1515 05/03/16 0758 05/03/16 0836 05/04/16 0548 05/05/16 0341 05/06/16 0351  WBC 9.2 9.5  --  4.0 6.0 5.0  HGB 7.5* 7.7* 6.1* 7.4* 7.3* 6.3*  HCT 22.4* 23.0* 18.0* 22.2* 21.7* 19.0*  MCV 82.4 82.7  --  84.1 84.4 86.8  PLT 123* 124*  --  108* 113* 91*    Basic Metabolic Panel:  Recent Labs Lab 05/02/16 0410 05/02/16 0952 05/03/16 0758 05/03/16 0836 05/03/16 1016 05/04/16 0548 05/05/16 0341 05/06/16 0351  NA 142  --  143 144  --  141 141 141  K 4.4  --  4.1 4.0  --  4.5 4.2 4.1  CL 112*  --  113* 109  --  112* 113* 112*  CO2 22  --  22  --   --  21* 22 23  GLUCOSE 178*  --  95 91  --  166* 106* 146*  BUN 81*  --  79* 85*  --  54* 53* 43*  CREATININE 3.84*  --  3.46* 3.50*  --  3.02* 2.89* 2.74*  CALCIUM 7.9*  --  8.0*  --   --  8.0* 8.7* 8.8*  MG  --  1.5*  --   --  1.7  --   --   --   PHOS  --   --   --   --   --   --  4.0  --     GFR: Estimated Creatinine Clearance: 22.3 mL/min (by C-G formula based on SCr of 2.74 mg/dL (H)).  Liver Function Tests:  Recent Labs Lab 04/30/16 0451 05/02/16 0410 05/04/16 0548 05/05/16 0341  AST 12* 12* 13*  --   ALT 15* 15* 15*  --   ALKPHOS 49 59 34*  --   BILITOT 0.6 0.7 0.8  --   PROT 4.3* 4.5* 5.1*  --   ALBUMIN 2.9* 3.0* 4.0 4.0    CBG:  Recent Labs Lab 05/04/16 2228 05/05/16 1150 05/05/16 1545 05/05/16 2035 05/06/16 0742  GLUCAP 127* 101* 191* 153* 98     Recent Results (from the past 240 hour(s))  MRSA PCR Screening     Status: None   Collection Time: 04/26/16  2:02 PM  Result  Value Ref Range Status   MRSA by PCR NEGATIVE NEGATIVE Final    Comment:        The GeneXpert MRSA Assay (FDA approved for NASAL specimens only), is one component of a comprehensive MRSA colonization surveillance program. It is  not intended to diagnose MRSA infection nor to guide or monitor treatment for MRSA infections.   MRSA PCR Screening     Status: None   Collection Time: 04/29/16  4:42 PM  Result Value Ref Range Status   MRSA by PCR NEGATIVE NEGATIVE Final    Comment:        The GeneXpert MRSA Assay (FDA approved for NASAL specimens only), is one component of a comprehensive MRSA colonization surveillance program. It is not intended to diagnose MRSA infection nor to guide or monitor treatment for MRSA infections.       Radiology Studies: No results found.   Medications:  Scheduled: . sodium chloride   Intravenous Once  . sodium chloride   Intravenous Once  . sodium chloride   Intravenous Once  . sodium chloride   Intravenous Once  . gabapentin  100 mg Oral QHS  . insulin aspart  0-15 Units Subcutaneous TID WC  . insulin aspart  0-5 Units Subcutaneous QHS  . insulin aspart  3 Units Subcutaneous TID WC  . levothyroxine  200 mcg Oral QAC breakfast  . metoprolol tartrate  12.5 mg Oral BID  . pantoprazole  40 mg Oral BID  . polyethylene glycol  17 g Oral TID  . predniSONE  60 mg Oral Q breakfast  . sodium bicarbonate  650 mg Oral BID  . sulfamethoxazole-trimethoprim  1 tablet Oral Q M,W,F-1800   Continuous: . sodium chloride    . citrate dextrose     [id number] **OR** acetaminophen, acetaminophen, diphenhydrAMINE, hydrALAZINE, ondansetron **OR** ondansetron (ZOFRAN) IV, traMADol, zolpidem      LOS: 20 days   [facility]  [facility] Pager [phone] 05/06/2016, 10:02 AM  If 7PM-7AM, please contact night-coverage at [email or web address], password [email or web address]

## 2016-05-06 NOTE — Progress Notes (Signed)
Laboratory results are noted on 05/06/2016. His hemoglobin did drop down to 6.3 with a platelet count of 91.  These findings do not suggest any new blood disorder. These findings are consistent with his medical condition and treatment. Therapeutic plasma exchange and renal failure is likely contributing to his anemia and mild thrombocytopenia. No evidence to suggest microangiopathy or other hematological disorder.  I recommended continue supportive management with transfusion of packed red cells as needed.

## 2016-05-07 ENCOUNTER — Inpatient Hospital Stay (HOSPITAL_COMMUNITY): Payer: Medicare Other

## 2016-05-07 ENCOUNTER — Inpatient Hospital Stay (HOSPITAL_COMMUNITY): Payer: Medicare Other | Admitting: Certified Registered"

## 2016-05-07 ENCOUNTER — Encounter (HOSPITAL_COMMUNITY): Payer: Self-pay

## 2016-05-07 ENCOUNTER — Encounter (HOSPITAL_COMMUNITY)
Admission: EM | Disposition: A | Discharge status: Home / Self Care | Payer: Self-pay | Source: Home / Self Care | Attending: Internal Medicine

## 2016-05-07 HISTORY — PX: ESOPHAGOGASTRODUODENOSCOPY: SHX5428

## 2016-05-07 LAB — RENAL FUNCTION PANEL
Albumin: 4.5 g/dL (ref 3.5–5.0)
Anion gap: 10 (ref 5–15)
BUN: 44 mg/dL — AB (ref 6–20)
CHLORIDE: 112 mmol/L — AB (ref 101–111)
CO2: 21 mmol/L — AB (ref 22–32)
Calcium: 9.1 mg/dL (ref 8.9–10.3)
Creatinine, Ser: 2.66 mg/dL — ABNORMAL HIGH (ref 0.61–1.24)
GFR calc Af Amer: 25 mL/min — ABNORMAL LOW (ref >60)
GFR, EST NON AFRICAN AMERICAN: 22 mL/min — AB (ref >60)
Glucose, Bld: 96 mg/dL (ref 65–99)
POTASSIUM: 5 mmol/L (ref 3.5–5.1)
Phosphorus: 3.9 mg/dL (ref 2.5–4.6)
Sodium: 143 mmol/L (ref 135–145)

## 2016-05-07 LAB — TYPE AND SCREEN
ABO/RH(D): A POS
ANTIBODY SCREEN: NEGATIVE
UNIT DIVISION: 0
Unit division: 0
Unit division: 0

## 2016-05-07 LAB — CBC
HEMATOCRIT: 25.7 % — AB (ref 39.0–52.0)
Hemoglobin: 8.6 g/dL — ABNORMAL LOW (ref 13.0–17.0)
MCH: 29.4 pg (ref 26.0–34.0)
MCHC: 33.5 g/dL (ref 30.0–36.0)
MCV: 87.7 fL (ref 78.0–100.0)
Platelets: 120 10*3/uL — ABNORMAL LOW (ref 150–400)
RBC: 2.93 MIL/uL — ABNORMAL LOW (ref 4.22–5.81)
RDW: 16.7 % — AB (ref 11.5–15.5)
WBC: 5.8 10*3/uL (ref 4.0–10.5)

## 2016-05-07 LAB — GLUCOSE, CAPILLARY
Glucose-Capillary: 104 mg/dL — ABNORMAL HIGH (ref 65–99)
Glucose-Capillary: 113 mg/dL — ABNORMAL HIGH (ref 65–99)
Glucose-Capillary: 163 mg/dL — ABNORMAL HIGH (ref 65–99)
Glucose-Capillary: 208 mg/dL — ABNORMAL HIGH (ref 65–99)
Glucose-Capillary: 83 mg/dL (ref 65–99)

## 2016-05-07 SURGERY — EGD (ESOPHAGOGASTRODUODENOSCOPY)
Anesthesia: Monitor Anesthesia Care

## 2016-05-07 MED ORDER — ACD FORMULA A 0.73-2.45-2.2 GM/100ML VI SOLN
500.0000 mL | Status: DC
Start: 2016-05-07 — End: 2016-05-07

## 2016-05-07 MED ORDER — SODIUM CHLORIDE 0.9 % IV SOLN
INTRAVENOUS | Status: AC
  Administered 2016-05-07 (×3): via INTRAVENOUS_CENTRAL
  Filled 2016-05-07 (×4): qty 200

## 2016-05-07 MED ORDER — ACD FORMULA A 0.73-2.45-2.2 GM/100ML VI SOLN
500.0000 mL | Status: DC
  Administered 2016-05-07: 500 mL via INTRAVENOUS
  Filled 2016-05-07: qty 500

## 2016-05-07 MED ORDER — LIDOCAINE HCL (CARDIAC) 20 MG/ML IV SOLN
INTRAVENOUS | Status: DC | PRN
  Administered 2016-05-07: 60 mg via INTRATRACHEAL

## 2016-05-07 MED ORDER — DIPHENHYDRAMINE HCL 25 MG PO CAPS: 25.0000 mg | ORAL_CAPSULE | Freq: Four times a day (QID) | ORAL | Status: DC | PRN

## 2016-05-07 MED ORDER — SODIUM CHLORIDE 0.9 % IV SOLN
Freq: Once | INTRAVENOUS | Status: AC
  Administered 2016-05-07: 15:00:00 via INTRAVENOUS_CENTRAL
  Filled 2016-05-07: qty 200

## 2016-05-07 MED ORDER — CALCIUM GLUCONATE 10 % IV SOLN
2.0000 g | Freq: Once | INTRAVENOUS | Status: DC
  Filled 2016-05-07: qty 20

## 2016-05-07 MED ORDER — CALCIUM CARBONATE ANTACID 500 MG PO CHEW
CHEWABLE_TABLET | ORAL | Status: AC
  Administered 2016-05-07: 400 mg
  Filled 2016-05-07: qty 2

## 2016-05-07 MED ORDER — ACETAMINOPHEN 325 MG PO TABS: 650.0000 mg | ORAL_TABLET | ORAL | Status: DC | PRN

## 2016-05-07 MED ORDER — ANTICOAGULANT SODIUM CITRATE 4% (200MG/5ML) IV SOLN: 5.0000 mL | Freq: Once | Status: DC

## 2016-05-07 MED ORDER — PROPOFOL 500 MG/50ML IV EMUL
INTRAVENOUS | Status: DC | PRN
  Administered 2016-05-07: 100 ug/kg/min via INTRAVENOUS

## 2016-05-07 MED ORDER — ANTICOAGULANT SODIUM CITRATE 4% (200MG/5ML) IV SOLN
5.0000 mL | Freq: Once | Status: AC
  Administered 2016-05-07: 5 mL
  Filled 2016-05-07: qty 250

## 2016-05-07 MED ORDER — BUTAMBEN-TETRACAINE-BENZOCAINE 2-2-14 % EX AERO
INHALATION_SPRAY | CUTANEOUS | Status: DC | PRN
  Administered 2016-05-07: 2 via TOPICAL

## 2016-05-07 MED ORDER — SODIUM CHLORIDE 0.9 % IV SOLN: 4.0000 g | Freq: Once | INTRAVENOUS | Status: DC

## 2016-05-07 MED ORDER — SODIUM CHLORIDE 0.9 % IV SOLN
Freq: Once | INTRAVENOUS | Status: DC
Start: 2016-05-07 — End: 2016-05-08

## 2016-05-07 MED ORDER — SODIUM CHLORIDE 0.9 % IV SOLN
4.0000 g | Freq: Once | INTRAVENOUS | Status: AC
  Administered 2016-05-07: 4 g via INTRAVENOUS
  Filled 2016-05-07: qty 40

## 2016-05-07 MED ORDER — ACD FORMULA A 0.73-2.45-2.2 GM/100ML VI SOLN
Status: AC
  Administered 2016-05-07: 13:00:00
  Filled 2016-05-07: qty 1000

## 2016-05-07 MED ORDER — CALCIUM CARBONATE ANTACID 500 MG PO CHEW
2.0000 | CHEWABLE_TABLET | ORAL | Status: AC
  Administered 2016-05-07: 400 mg via ORAL

## 2016-05-07 NOTE — Anesthesia Procedure Notes (Signed)
Procedure Name: MAC Date/Time: 05/07/2016 9:20 AM Performed by: Barrington Ellison Pre-anesthesia Checklist: Patient identified, Emergency Drugs available, Suction available, Patient being monitored and Timeout performed Patient Re-evaluated:Patient Re-evaluated prior to inductionOxygen Delivery Method: Nasal cannula

## 2016-05-07 NOTE — Anesthesia Postprocedure Evaluation (Addendum)
Anesthesia Post Note  Patient: Nicholas Hudson  Procedure(s) Performed: Procedure(s) (LRB): ESOPHAGOGASTRODUODENOSCOPY (EGD) ([name])  Patient location during evaluation: PACU Anesthesia Type: MAC Level of consciousness: awake and alert Pain management: pain level controlled Vital Signs Assessment: post-procedure vital signs reviewed and stable Respiratory status: spontaneous breathing, nonlabored ventilation, respiratory function stable and patient connected to nasal cannula oxygen Cardiovascular status: stable and blood pressure returned to baseline Anesthetic complications: no       Last Vitals:  Vitals:   05/07/16 1700 05/07/16 1715  BP: 133/65 135/81  Pulse:    Resp: 15 16  Temp: 37.1 C 36.7 C    Last Pain:  Vitals:   05/07/16 1715  TempSrc: Oral  PainSc:                  Xaden Kaufman

## 2016-05-07 NOTE — Anesthesia Preprocedure Evaluation (Signed)
Anesthesia Evaluation  Patient identified by MRN, date of birth, ID band Patient awake    Reviewed: Allergy & Precautions, NPO status , Patient's Chart, lab work & pertinent test results  History of Anesthesia Complications Negative for: history of anesthetic complications  Airway Mallampati: II  TM Distance: >3 FB Neck ROM: Full    Dental  (+) Dental Advisory Given, Partial Upper   Pulmonary neg pulmonary ROS,    breath sounds clear to auscultation       Cardiovascular negative cardio ROS   Rhythm:Regular     Neuro/Psych  Headaches, negative psych ROS   GI/Hepatic Neg liver ROS, Possible gi bleed   Endo/Other    Renal/GU Dialysis and Renal InsufficiencyRenal disease     Musculoskeletal   Abdominal   Peds  Hematology  (+) anemia ,   Anesthesia Other Findings   Reproductive/Obstetrics                             Anesthesia Physical Anesthesia Plan  ASA: III  Anesthesia Plan: MAC   Post-op Pain Management:    Induction: Intravenous  Airway Management Planned: Natural Airway, Nasal Cannula and Simple Face Mask  Additional Equipment: None  Intra-op Plan:   Post-operative Plan:   Informed Consent: I have reviewed the patients History and Physical, chart, labs and discussed the procedure including the risks, benefits and alternatives for the proposed anesthesia with the patient or authorized representative who has indicated his/her understanding and acceptance.   Dental advisory given  Plan Discussed with: CRNA and Surgeon  Anesthesia Plan Comments:         Anesthesia Quick Evaluation

## 2016-05-07 NOTE — Progress Notes (Signed)
Patient ID: Nicholas Hudson, male   DOB: 1941/02/09, 76 y.o.   MRN: [id number] S:Feels good, no complaints O:BP (!) 159/58   Pulse (!) 50   Temp 98.4 F (36.9 C) (Axillary)   Resp 16   Ht 5\' 5"  (1.651 m)   Wt 82.7 kg (182 lb 6.4 oz)   SpO2 100%   BMI 30.35 kg/m   Intake/Output Summary (Last 24 hours) at 05/07/16 1021 Last data filed at 05/07/16 [id number]  Gross per 24 hour  Intake           343.33 ml  Output             2050 ml  Net         -1706.67 ml   Intake/Output: I/O last 3 completed shifts: In: 721.3 [I.V.:5.3; Blood:716] Out: 2950 [Urine:2950]  Intake/Output this shift:  Total I/O In: 10 [P.O.:10] Out: 300 [Urine:300] Weight change: 0.771 kg (1 lb 11.2 oz) Gen:NAD CVS:no rub Resp:cta [id number] Ext:1+ edema   Recent Labs Lab 05/01/16 1634 05/02/16 0410 05/03/16 0758 05/03/16 0836 05/04/16 0548 05/05/16 0341 05/06/16 0351 05/07/16 0435  NA 143 142 143 144 141 141 141 143  K 4.4 4.4 4.1 4.0 4.5 4.2 4.1 5.0  CL 111 112* 113* 109 112* 113* 112* 112*  CO2 20* 22 22  --  21* 22 23 21*  GLUCOSE 193* 178* 95 91 166* 106* 146* 96  BUN 85* 81* 79* 85* 54* 53* 43* 44*  CREATININE 4.17* 3.84* 3.46* 3.50* 3.02* 2.89* 2.74* 2.66*  ALBUMIN  --  3.0*  --   --  4.0 4.0  --  4.5  CALCIUM 7.7* 7.9* 8.0*  --  8.0* 8.7* 8.8* 9.1  PHOS  --   --   --   --   --  4.0  --  3.9  AST  --  12*  --   --  13*  --   --   --   ALT  --  15*  --   --  15*  --   --   --    Liver Function Tests:  Recent Labs Lab 05/02/16 0410 05/04/16 0548 05/05/16 0341 05/07/16 0435  AST 12* 13*  --   --   ALT 15* 15*  --   --   ALKPHOS 59 34*  --   --   BILITOT 0.7 0.8  --   --   PROT 4.5* 5.1*  --   --   ALBUMIN 3.0* 4.0 4.0 4.5   No results for input(s): LIPASE, AMYLASE in the last 168 hours. No results for input(s): AMMONIA in the last 168 hours. CBC:  Recent Labs Lab 05/03/16 0758  05/04/16 0548 05/05/16 0341 05/06/16 0351 05/07/16 0435  WBC 9.5  --  4.0 6.0 5.0 5.8  HGB 7.7*  <  > 7.4* 7.3* 6.3* 8.6*  HCT 23.0*  < > 22.2* 21.7* 19.0* 25.7*  MCV 82.7  --  84.1 84.4 86.8 87.7  PLT 124*  --  108* 113* 91* 120*  < > = values in this interval not displayed. Cardiac Enzymes:  Recent Labs Lab 05/02/16 0952 05/02/16 1500  TROPONINI <0.03 <0.03   CBG:  Recent Labs Lab 05/06/16 1629 05/06/16 2005 05/07/16 0033 05/07/16 0817 05/07/16 0950  GLUCAP 173* 199* 113* 83 104*    Iron Studies:  Recent Labs  05/05/16 0341  IRON 55  TIBC 188*  FERRITIN 386*   Studies/Results: No results found. . sodium chloride  Intravenous Once  . sodium chloride   Intravenous Once  . sodium chloride   Intravenous Once  . gabapentin  100 mg Oral QHS  . insulin aspart  0-15 Units Subcutaneous TID WC  . insulin aspart  0-5 Units Subcutaneous QHS  . insulin aspart  3 Units Subcutaneous TID WC  . levothyroxine  200 mcg Oral QAC breakfast  . pantoprazole  40 mg Oral BID  . polyethylene glycol  17 g Oral TID  . predniSONE  60 mg Oral Q breakfast  . sodium bicarbonate  650 mg Oral BID  . sulfamethoxazole-trimethoprim  1 tablet Oral Q M,W,F-1800    BMET    Component Value Date/Time   NA 143 05/07/2016 0435   K 5.0 05/07/2016 0435   CL 112 (H) 05/07/2016 0435   CO2 21 (L) 05/07/2016 0435   GLUCOSE 96 05/07/2016 0435   BUN 44 (H) 05/07/2016 0435   CREATININE 2.66 (H) 05/07/2016 0435   CALCIUM 9.1 05/07/2016 0435   GFRNONAA 22 (L) 05/07/2016 0435   GFRAA 25 (L) 05/07/2016 0435   CBC    Component Value Date/Time   WBC 5.8 05/07/2016 0435   RBC 2.93 (L) 05/07/2016 0435   HGB 8.6 (L) 05/07/2016 0435   HCT 25.7 (L) 05/07/2016 0435   PLT 120 (L) 05/07/2016 0435   MCV 87.7 05/07/2016 0435   MCH 29.4 05/07/2016 0435   MCHC 33.5 05/07/2016 0435   RDW 16.7 (H) 05/07/2016 0435   LYMPHSABS 1.0 04/17/2016 0823   MONOABS 0.8 04/17/2016 0823   EOSABS 0.1 04/17/2016 0823   BASOSABS 0.0 04/17/2016 0823     Assessment/Plan:  1. 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 S/p renal bx 04/20/16 with pauci immune gn 40% crescents, ATN, minimal scarring 2. S/p pulse solumedrol now on prednisone 60mg  daily 3. Po cytoxan on hold for GI bleed until next week 4. TPE 1.5 PV x 7 qod, albumin colloid on 2/14, 2/19, 2/20, 2/22, and for last 2 tx's on 2/24 and 2/26. 2. UGIB 04/28/16 due to duodenal ulcer s/p clipping and on PPI 1. Continues to drop  2. S/p EGD today which showed 101mm duodenal ulcer with exposed vessel s/p argon laser cauterization. 3. ABLA/anemia of chronic disease 1. Transfuse again for hgb drop to 6.3 2. Appreciate Heme input 4. Hyperkalemia- resolved 5. DM  Donetta Potts, MD [employer] [phone]

## 2016-05-07 NOTE — Progress Notes (Signed)
Pt will place CPAP on when he's ready for bed. RT will continue to monitor.

## 2016-05-07 NOTE — Op Note (Signed)
[id number] [facility] Patient Name: Crimson Beer Procedure Date : 05/07/2016 MRN: [id number] Attending MD: Nancy Fetter Dr., MD Date of Birth: [birth year] CSN: [id number] Age: 76 Admit Type: Inpatient Procedure:                Upper GI endoscopy with control of bleeding                            utilizing APC Indications:              Hematochezia, Active gastrointestinal bleeding Providers:                Jeneen Rinks L. Deane Wattenbarger Dr., MD, Kingsley Plan, RN,                            Elspeth Cho Tech., Technician, Sampson Si, CRNA Referring MD:              Medicines:                Monitored Anesthesia Care Complications:            No immediate complications. Estimated Blood Loss:     Estimated blood loss: none. Procedure:                Pre-Anesthesia Assessment:                           - Prior to the procedure, a History and Physical                            was performed, and patient medications and                            allergies were reviewed. The patient's tolerance of                            previous anesthesia was also reviewed. The risks                            and benefits of the procedure and the sedation                            options and risks were discussed with the patient.                            All questions were answered, and informed consent                            was obtained. Prior Anticoagulants: The patient has                            taken no previous anticoagulant or antiplatelet                            agents. ASA Grade Assessment: III - A patient with  severe systemic disease. After reviewing the risks                            and benefits, the patient was deemed in                            satisfactory condition to undergo the procedure.                           After obtaining informed consent, the endoscope was                            passed under direct vision. Throughout the                          procedure, the patient's blood pressure, pulse, and                            oxygen saturations were monitored continuously. The                            [id number] ([id number]) scope was introduced through the                            mouth, and advanced to the second part of duodenum.                            The upper GI endoscopy was accomplished without                            difficulty. The patient tolerated the procedure                            well. Scope In: Scope Out: Findings:      The esophagus was normal.      The stomach was normal.      One non-obstructing oozing cratered duodenal ulcer with a visible vessel       was found in the second portion of the duodenum. Endo clip from previous       treatment was clearly seen. There was minimal bleeding but no other       sites of potential bleeding were seen. The lesion was 10 mm in largest       dimension. Coagulation for hemostasis using argon plasma was successful.       we cauterize the area of the vessel and slightly losing right near the       Endo clip. No active bleeding was seen at the termination of the       treatment. Impression:               - Normal esophagus.                           - Normal stomach.                           - One non-obstructing oozing duodenal  ulcer with a                            visible vessel. Treated with argon plasma                            coagulation (APC).                           - No specimens collected. Moderate Sedation:      MAC by anesthesia Recommendation:           - Return patient to hospital ward for ongoing care.                           - Resume previous diet.                           - Continue present medications. 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 All rights reserved. The codes documented in this report are preliminary and upon coder review may  be revised to meet current compliance requirements. Nancy Fetter Dr., MD 05/07/2016 9:45:39 AM This report has been signed electronically. Number of Addenda: 0

## 2016-05-07 NOTE — H&P (View-Only) (Signed)
[facility] PROGRESS NOTE Subjective patient's hemoglobin dropped to 6.3 this morning. He went 2 days without a bowel movement despite taking Miralax and had a large bowel movement this morning that was still dark area he does not feel weaker dizzy. Vital signs are okay. He is receiving unit of blood.  Objective: Vital signs in last 24 hours: Temp:  [97 F (36.1 C)-98.6 F (37 C)] 97.7 F (36.5 C) (02/23 0645) Pulse Rate:  [59-74] 62 (02/23 0645) Resp:  [12-22] 18 (02/23 0645) BP: (112-144)/(52-66) 144/66 (02/23 0645) SpO2:  [98 %-100 %] 100 % (02/23 0645) Weight:  [82 kg (180 lb 11.2 oz)] 82 kg (180 lb 11.2 oz) (02/23 0653) Last BM Date: 05/04/16  Intake/Output from previous day: 02/22 0701 - 02/23 0700 In: 680 [P.O.:680] Out: 2850 [Urine:2850] Intake/Output this shift: No intake/output data recorded.    Lab Results:  Recent Labs  05/03/16 0758 05/03/16 0836 05/04/16 0548 05/05/16 0341 05/06/16 0351  WBC 9.5  --  4.0 6.0 5.0  HGB 7.7* 6.1* 7.4* 7.3* 6.3*  HCT 23.0* 18.0* 22.2* 21.7* 19.0*  PLT 124*  --  108* 113* 91*   BMET  Recent Labs  05/03/16 0758 05/03/16 0836 05/04/16 0548 05/05/16 0341 05/06/16 0351  NA 143 144 141 141 141  K 4.1 4.0 4.5 4.2 4.1  CL 113* 109 112* 113* 112*  CO2 22  --  21* 22 23  CREATININE 3.46* 3.50* 3.02* 2.89* 2.74*   LFT  Recent Labs  05/04/16 0548  PROT 5.1*  AST 13*  ALT 15*  ALKPHOS 34*  BILITOT 0.8   PT/INR  Recent Labs  05/03/16 0908  LABPROT 24.3*  INR 2.14   PANCREAS No results for input(s): LIPASE in the last 72 hours.       Studies/Results: No results found.  Medications: I have reviewed the patient's current medications.  Assessment/Plan: 1. Bleeding duodenal ulcer. The patient had an arterial bleed a week ago and may still be using from the ulcer. He really has not been passing much stool despite the use of Miralax. It's not clear if he is still having ongoing bleeding, or if  this is multifactorial related to his hematologic and vascular disease in conjunction with slow bleeding from the ulcer. I don't feel that he has an arterial bleed like he did last week. At this point we will increase his Miralax and will go ahead and plan on repeat EGD in the morning. If he clearly stops bleeding we can always cancel it. We will keep him NPO in the morning just in case. Have discussed this with the patient he is agreeable.   Mirha Brucato JR,Nelle Sayed L 05/06/2016, 7:14 AM  This note was created using voice recognition software. Minor errors may Have occurred unintentionally.  Pager: [phone] If no answer or after hours call [phone]

## 2016-05-07 NOTE — Progress Notes (Signed)
Patient heart rate has been decreasing into the 40's, asymptomatic. Patient has metoprolol 12.5mg  due to give, [facility] paged.  Per Dr. Hal Hope hold tonights dose of metoprolol.

## 2016-05-07 NOTE — Progress Notes (Signed)
Notified by CCMD, pt had 8 bts VT.  Pt asymptomatic with stable VS.  Will cont to monitor closely.

## 2016-05-07 NOTE — Progress Notes (Signed)
Reviewed safety plan with patient regarding bed alarm activation at bedtime.  Pt refuses for the bed alarm to be put on.  Pt agrees to call for assistance.  Will continue to monitor closely.

## 2016-05-07 NOTE — Interval H&P Note (Signed)
History and Physical Interval Note:  05/07/2016 9:18 AM  Nicholas Hudson  has presented today for surgery, with the diagnosis of GI bleeding  The various methods of treatment have been discussed with the patient and family. After consideration of risks, benefits and other options for treatment, the patient has consented to  Procedure(s): ESOPHAGOGASTRODUODENOSCOPY (EGD) ([name]) as a surgical intervention .  The patient's history has been reviewed, patient examined, no change in status, stable for surgery.  I have reviewed the patient's chart and labs.  Questions were answered to the patient's satisfaction.     Rennie Hack JR,Yuki Purves L

## 2016-05-07 NOTE — Transfer of Care (Signed)
Immediate Anesthesia Transfer of Care Note  Patient: Nicholas Hudson  Procedure(s) Performed: Procedure(s): ESOPHAGOGASTRODUODENOSCOPY (EGD) ([name])  Patient Location: Endoscopy Unit  Anesthesia Type:MAC  Level of Consciousness: awake  Airway & Oxygen Therapy: Patient Spontanous Breathing  Post-op Assessment: Report given to RN  Post vital signs: Reviewed and stable  Last Vitals:  Vitals:   05/07/16 0819 05/07/16 0838  BP: 135/68 (!) 184/70  Pulse: 96 (!) 51  Resp: 19 14  Temp: 36.8 C 36.6 C    Last Pain:  Vitals:   05/07/16 0838  TempSrc: Oral  PainSc:       Patients Stated Pain Goal: 0 (33/61/22 4497)  Complications: No apparent anesthesia complications

## 2016-05-07 NOTE — Progress Notes (Signed)
[facility] PROGRESS NOTE  Nicholas Hudson [id number] DOB: [birth year] DOA: 04/16/2016  PCP: Kandice Hams, MD  Brief History/Interval Summary: Nicholas Hudson is an 76 y.o. male with medical history significant of PBH, gout, recurrent sinus infection, hypothyroidism. Patient with history of sinus, polyps problems, infection for last 6 to 8 weeks, he has received 2 course of antibiotics by Dr Lucia Gaskins. Patient was found to have worsening renal function. 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 Renal function  stabilized after a few sessions of dialysis. 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 Patient   Also started on plasmapheresis. Hospital course complicated by upper GI bleeding for which he was evaluated by Dr. Oletta Lamas. Patient also seen by hematology oncology because of anemia unresponsive to multiple units of packed red blood cells   Assessment/Plan:    Bleeding duodenal ulcer Started having melanotic stools 2/16, still having some dark stools Hemoglobin unresponsive to blood transfusions Initially received  IV PPI infusion , as the patient is on prednisone, will  continue Protonix   Status post transfusion of 8 units of packed red blood cells Notified nephrology prior to all transfusions to monitor volume status  Appreciate  [facility]   evaluation EGD     2/16            -  non-obstructing oozing duodenal ulcer with a                            visible vessel. Treatment not successful. Treated                            with bipolar cautery. Injected. Clip was placed.                                                  Dr Oletta Lamas recommended general surgery consult ,which has been completed, surgery did not recommend any immediate intervention He also recommends repeat EGD   2/24 for further evaluation of his persistent anemia-One non-obstructing oozing duodenal ulcer with a visible vessel.  Treated with argon plasma  coagulation (APC). Hematology oncology has also been consulted this admission for anemia refractory to multiple units of packed red blood cells , ruled out for hemolytic,bone marrow issues . Hematology following peripherally    Anemia refractory to PRBC transfusion Status post transfusion of 8 units of packed red blood cells Hematology oncology consulted, according to the recommendations, His haptoglobin is normal at 80 and LDH 175. His Coombs testing is negative. Iron studies showed no evidence of iron deficiency at this time. no evidence of hemolysis or microangiopathy. His anemia is likely combination of blood loss, renal insufficiency and marrow suppression from acute illness and Cytoxan.  blood loss related to plasma exchange also can contribute.  Continue supportive transfusions as needed if he becomes symptomatic or hemoglobin less than 7. 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 His creatinine was 1.0 about a year ago. Nephrology was consulted. Autoimmune workup is positive for an elevated myeloperoxidase antibody.  concerning for  ANCA glomerulonephritis.  Renal biopsy was performed 2/7. 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 Completed pulse steroids; now on prednisone 60 mg a day. .   Patient started on cyclophosphamide which is now on hold due to GI bleeding/anemia , continue  Bactrim for prophylaxis. Being dialyzed based on renal function and  urine output  Creatinine improving  4.4>4.9>4.63>4.54>3.84 >3.01>2.74>2.66 Also, on plasmapheresis -every other day,last 2 tx's will be on 2/24 and 2/26. Renal function stable, good uop, brief episode of hyperkalemia treated this admission with kayexalate  Off HD  For several  days now, patient may not need long-term dialysis    Sepsis of unclear source, resolved  Culture data negative till date. Repeat chest x-ray is negative. Patient was initially started on the sepsis pathway. He was given vancomycin and Zosyn. Vancomycin was discontinued due to renal failure. No clear infectious foci. MRI brain did not show any acute abnormalities. MRI abdomen did not show any intra-abdominal process. Zosyn was discontinued as well. Patient could've had a viral syndrome. MRI brain did show paranasal sinus disease and mastoid fluid. However patient has recently completed multiple courses of antibiotics for same. Continue to monitor off of antibiotics. Sepsis physiology, resolved. WBC is normal.   Metabolic encephalopathy Resolved. MRI brain did not show any acute process. Confusion could have been due to acute renal failure.   Elevated alkaline phosphatase Patient noted an elevated alkaline phosphatase. AST, ALT normal. Bilirubin normal. Abdominal ultrasound showed possible pancreatic mass. MRI did not show any lesion in the pancreas. Finding on the ultrasound was thought to be artifactual from bowel. Alkaline phosphatase is now back to normal  Hypertension stable- Patient does not seem to be on any blood pressure lowering agents at home. Blood pressure could be elevated due to effect of steroid. Continue to monitor for now.Will start prn hydralazine as needed   Hyperglycemia No history of diabetes as outpatient. Hyperglycemia is likely due to steroids. Since he is now just on oral steroids, we decreased the dose of his insulin. HbA1c was 6.5. Continue to monitor CBGs and adjust dose of insulin as needed.   Hypothyroidism Continue Synthroid.  Hyponatremia Has improved after dialysis.   DVT Prophylaxis: SCDs  Code Status: Full code  Family Communication: Discussed with patient, wife and daughter Disposition Plan: continue tele, EGD , continue plasmapheresis,  disposition per nephrology, GI    Consultants: Nephrology, gastroenterology,gen surgery, hematology/oncology,  Procedures:  Renal biopsy 2/7  Placement of HD catheter 2/9  Echocardiogram Study Conclusions  - Left ventricle: The cavity size was normal. There was moderate   concentric hypertrophy. Systolic function was normal. The   estimated ejection fraction was in the range of 50% to 55%. Wall   motion was normal; there were no regional wall motion   abnormalities. Doppler parameters are consistent with abnormal   left ventricular relaxation (grade 1 diastolic dysfunction). - Aortic valve: Trileaflet; mildly thickened, mildly calcified   leaflets. - Aorta: Ascending aortic diameter: 42 mm (S). - Ascending aorta: The ascending aorta was mildly dilated. - Mitral valve: There was mild regurgitation. - Left atrium: The atrium was mildly dilated.  Hemodialysis  Plasmapheresis starting 2/12  Antibiotics: Initially started on vancomycin and Zosyn. Both of these antibiotics have been discontinued.      Subjective Denies any chest pain shortness of breath or further bleeding  Objective:  Vital Signs  Vitals:   05/06/16 1548 05/06/16 2008 05/06/16 2300 05/07/16 0500  BP: (!) 155/69 Marland Kitchen)  155/68 (!) 153/97 (!) 159/81  Pulse:  (!) 51 (!) 43 (!) 43  Resp: 19  18   Temp: 98.2 F (36.8 C) 97.8 F (36.6 C) 98.2 F (36.8 C) 97.5 F (36.4 C)  TempSrc: Oral Oral Oral Oral  SpO2: 100% 100% 99% 97%  Weight:    82.7 kg (182 lb 6.4 oz)  Height:        Intake/Output Summary (Last 24 hours) at 05/07/16 0810 Last data filed at 05/07/16 0600  Gross per 24 hour  Intake           721.33 ml  Output             1750 ml  Net         -1028.67 ml   Filed Weights   05/05/16 0700 05/06/16 0653 05/07/16 0500  Weight: 80.6 kg (177 lb 11.1 oz) 82 kg (180 lb 11.2 oz) 82.7 kg (182 lb 6.4 oz)    General appearance: alert, cooperative, appears stated age and no distress Resp: clear to  auscultation bilaterally Cardio: regular rate and rhythm, S1, S2 normal, no murmur, click, rub or gallop GI: soft, non-tender; bowel sounds normal; no masses,  no organomegaly Extremities: Does have some edema bilateral lower extremity Neurologic: Awake and alert. No focal deficits.  Lab Results:  Data Reviewed: I have personally reviewed following labs and imaging studies  CBC:  Recent Labs Lab 05/03/16 0758 05/03/16 0836 05/04/16 0548 05/05/16 0341 05/06/16 0351 05/07/16 0435  WBC 9.5  --  4.0 6.0 5.0 5.8  HGB 7.7* 6.1* 7.4* 7.3* 6.3* 8.6*  HCT 23.0* 18.0* 22.2* 21.7* 19.0* 25.7*  MCV 82.7  --  84.1 84.4 86.8 87.7  PLT 124*  --  108* 113* 91* 120*    Basic Metabolic Panel:  Recent Labs Lab 05/02/16 0952 05/03/16 0758 05/03/16 0836 05/03/16 1016 05/04/16 0548 05/05/16 0341 05/06/16 0351 05/07/16 0435  NA  --  143 144  --  141 141 141 143  K  --  4.1 4.0  --  4.5 4.2 4.1 5.0  CL  --  113* 109  --  112* 113* 112* 112*  CO2  --  22  --   --  21* 22 23 21*  GLUCOSE  --  95 91  --  166* 106* 146* 96  BUN  --  79* 85*  --  54* 53* 43* 44*  CREATININE  --  3.46* 3.50*  --  3.02* 2.89* 2.74* 2.66*  CALCIUM  --  8.0*  --   --  8.0* 8.7* 8.8* 9.1  MG 1.5*  --   --  1.7  --   --   --   --   PHOS  --   --   --   --   --  4.0  --  3.9    GFR: Estimated Creatinine Clearance: 23 mL/min (by C-G formula based on SCr of 2.66 mg/dL (H)).  Liver Function Tests:  Recent Labs Lab 05/02/16 0410 05/04/16 0548 05/05/16 0341 05/07/16 0435  AST 12* 13*  --   --   ALT 15* 15*  --   --   ALKPHOS 59 34*  --   --   BILITOT 0.7 0.8  --   --   PROT 4.5* 5.1*  --   --   ALBUMIN 3.0* 4.0 4.0 4.5    CBG:  Recent Labs Lab 05/06/16 0742 05/06/16 1120 05/06/16 1629 05/06/16 2005 05/07/16 0033  GLUCAP 98 132*  173* 199* 113*     Recent Results (from the past 240 hour(s))  MRSA PCR Screening     Status: None   Collection Time: 04/29/16  4:42 PM  Result Value Ref Range  Status   MRSA by PCR NEGATIVE NEGATIVE Final    Comment:        The GeneXpert MRSA Assay (FDA approved for NASAL specimens only), is one component of a comprehensive MRSA colonization surveillance program. It is not intended to diagnose MRSA infection nor to guide or monitor treatment for MRSA infections.       Radiology Studies: No results found.   Medications:  Scheduled: . sodium chloride   Intravenous Once  . sodium chloride   Intravenous Once  . sodium chloride   Intravenous Once  . gabapentin  100 mg Oral QHS  . insulin aspart  0-15 Units Subcutaneous TID WC  . insulin aspart  0-5 Units Subcutaneous QHS  . insulin aspart  3 Units Subcutaneous TID WC  . levothyroxine  200 mcg Oral QAC breakfast  . pantoprazole  40 mg Oral BID  . polyethylene glycol  17 g Oral TID  . predniSONE  60 mg Oral Q breakfast  . sodium bicarbonate  650 mg Oral BID  . sulfamethoxazole-trimethoprim  1 tablet Oral Q M,W,F-1800   Continuous: . sodium chloride 20 mL/hr at 05/07/16 0544  . citrate dextrose     [id number] **OR** acetaminophen, acetaminophen, diphenhydrAMINE, hydrALAZINE, ondansetron **OR** ondansetron (ZOFRAN) IV, traMADol, zolpidem      LOS: 21 days   [facility]  [facility] Pager [phone] 05/07/2016, 8:10 AM  If 7PM-7AM, please contact night-coverage at [email or web address], password [facility]

## 2016-05-08 DIAGNOSIS — E039 Hypothyroidism, unspecified: Secondary | ICD-10-CM

## 2016-05-08 DIAGNOSIS — N009 Acute nephritic syndrome with unspecified morphologic changes: Secondary | ICD-10-CM

## 2016-05-08 LAB — BASIC METABOLIC PANEL
Anion gap: 9 (ref 5–15)
BUN: 34 mg/dL — AB (ref 6–20)
CHLORIDE: 113 mmol/L — AB (ref 101–111)
CO2: 24 mmol/L (ref 22–32)
Calcium: 8.9 mg/dL (ref 8.9–10.3)
Creatinine, Ser: 2.47 mg/dL — ABNORMAL HIGH (ref 0.61–1.24)
GFR calc Af Amer: 27 mL/min — ABNORMAL LOW (ref >60)
GFR calc non Af Amer: 24 mL/min — ABNORMAL LOW (ref >60)
GLUCOSE: 94 mg/dL (ref 65–99)
POTASSIUM: 3.8 mmol/L (ref 3.5–5.1)
Sodium: 146 mmol/L — ABNORMAL HIGH (ref 135–145)

## 2016-05-08 LAB — PREPARE FRESH FROZEN PLASMA
BLOOD PRODUCT EXPIRATION DATE: 201803012359
BLOOD PRODUCT EXPIRATION DATE: 201803012359
Blood Product Expiration Date: 201803012359
Blood Product Expiration Date: 201803012359
ISSUE DATE / TIME: 201802241134
ISSUE DATE / TIME: 201802241134
ISSUE DATE / TIME: 201802241134
ISSUE DATE / TIME: 201802241134
UNIT TYPE AND RH: 6200
UNIT TYPE AND RH: 6200
Unit Type and Rh: 6200
Unit Type and Rh: 6200

## 2016-05-08 LAB — GLUCOSE, CAPILLARY
GLUCOSE-CAPILLARY: 121 mg/dL — AB (ref 65–99)
Glucose-Capillary: 92 mg/dL (ref 65–99)
Glucose-Capillary: 121 mg/dL — ABNORMAL HIGH (ref 65–99)
Glucose-Capillary: 186 mg/dL — ABNORMAL HIGH (ref 65–99)

## 2016-05-08 LAB — CBC
HEMATOCRIT: 23.7 % — AB (ref 39.0–52.0)
Hemoglobin: 7.8 g/dL — ABNORMAL LOW (ref 13.0–17.0)
MCH: 29 pg (ref 26.0–34.0)
MCHC: 32.9 g/dL (ref 30.0–36.0)
MCV: 88.1 fL (ref 78.0–100.0)
Platelets: 98 10*3/uL — ABNORMAL LOW (ref 150–400)
RBC: 2.69 MIL/uL — ABNORMAL LOW (ref 4.22–5.81)
RDW: 16.9 % — AB (ref 11.5–15.5)
WBC: 4.7 10*3/uL (ref 4.0–10.5)

## 2016-05-08 NOTE — Progress Notes (Signed)
Patient ID: Nicholas Hudson, male   DOB: [birth year], 76 y.o.   MRN: [id number] S:Feels well O:BP (!) 169/78 (BP Location: Left Arm)   Pulse (!) 54   Temp 98.4 F (36.9 C) (Oral)   Resp 17   Ht 5\' 5"  (1.651 m)   Wt 83.4 kg (183 lb 12.8 oz)   SpO2 99%   BMI 30.59 kg/m   Intake/Output Summary (Last 24 hours) at 05/08/16 0853 Last data filed at 05/08/16 0500  Gross per 24 hour  Intake              530 ml  Output             2500 ml  Net            -1970 ml   Intake/Output: I/O last 3 completed shifts: In: 545.3 [P.O.:250; I.V.:5.3; IV Piggyback:290] Out: 3975 [Urine:3975]  Intake/Output this shift:  No intake/output data recorded. Weight change: 0.635 kg (1 lb 6.4 oz) Gen:WD NAD CVS:no rub Resp:cta [id number] Ext:1+ edema   Recent Labs Lab 05/02/16 0410 05/03/16 0758 05/03/16 0836 05/04/16 0548 05/05/16 0341 05/06/16 0351 05/07/16 0435 05/08/16 0506  NA 142 143 144 141 141 141 143 146*  K 4.4 4.1 4.0 4.5 4.2 4.1 5.0 3.8  CL 112* 113* 109 112* 113* 112* 112* 113*  CO2 22 22  --  21* 22 23 21* 24  GLUCOSE 178* 95 91 166* 106* 146* 96 94  BUN 81* 79* 85* 54* 53* 43* 44* 34*  CREATININE 3.84* 3.46* 3.50* 3.02* 2.89* 2.74* 2.66* 2.47*  ALBUMIN 3.0*  --   --  4.0 4.0  --  4.5  --   CALCIUM 7.9* 8.0*  --  8.0* 8.7* 8.8* 9.1 8.9  PHOS  --   --   --   --  4.0  --  3.9  --   AST 12*  --   --  13*  --   --   --   --   ALT 15*  --   --  15*  --   --   --   --    Liver Function Tests:  Recent Labs Lab 05/02/16 0410 05/04/16 0548 05/05/16 0341 05/07/16 0435  AST 12* 13*  --   --   ALT 15* 15*  --   --   ALKPHOS 59 34*  --   --   BILITOT 0.7 0.8  --   --   PROT 4.5* 5.1*  --   --   ALBUMIN 3.0* 4.0 4.0 4.5   No results for input(s): LIPASE, AMYLASE in the last 168 hours. No results for input(s): AMMONIA in the last 168 hours. CBC:  Recent Labs Lab 05/04/16 0548 05/05/16 0341 05/06/16 0351 05/07/16 0435 05/08/16 0506  WBC 4.0 6.0 5.0 5.8 4.7  HGB 7.4* 7.3*  6.3* 8.6* 7.8*  HCT 22.2* 21.7* 19.0* 25.7* 23.7*  MCV 84.1 84.4 86.8 87.7 88.1  PLT 108* 113* 91* 120* 98*   Cardiac Enzymes:  Recent Labs Lab 05/02/16 0952 05/02/16 1500  TROPONINI <0.03 <0.03   CBG:  Recent Labs Lab 05/07/16 0817 05/07/16 0950 05/07/16 1201 05/07/16 2147 05/08/16 0738  GLUCAP 83 104* 208* 163* 92    Iron Studies: No results for input(s): IRON, TIBC, TRANSFERRIN, FERRITIN in the last 72 hours. Studies/Results: Dg Chest 2 View  Result Date: 05/07/2016 CLINICAL DATA:  Cough for 2 days. Sore throat, chest pain and shortness of breath. EXAM: CHEST  2  VIEW COMPARISON:  04/17/16 FINDINGS: Right chest wall dialysis catheter is noted with tip in the projection of the SVC. Aortic atherosclerosis is identified. Normal heart size. Trace bilateral pleural effusions identified. No airspace opacities. IMPRESSION: 1. Trace pleural effusions. 2. Aortic atherosclerosis. Electronically Signed   By: Kerby Moors M.D.   On: 05/07/2016 15:09   . gabapentin  100 mg Oral QHS  . insulin aspart  0-15 Units Subcutaneous TID WC  . insulin aspart  0-5 Units Subcutaneous QHS  . insulin aspart  3 Units Subcutaneous TID WC  . levothyroxine  200 mcg Oral QAC breakfast  . pantoprazole  40 mg Oral BID  . polyethylene glycol  17 g Oral TID  . predniSONE  60 mg Oral Q breakfast  . sodium bicarbonate  650 mg Oral BID  . sulfamethoxazole-trimethoprim  1 tablet Oral Q M,W,F-1800    BMET    Component Value Date/Time   NA 146 (H) 05/08/2016 0506   K 3.8 05/08/2016 0506   CL 113 (H) 05/08/2016 0506   CO2 24 05/08/2016 0506   GLUCOSE 94 05/08/2016 0506   BUN 34 (H) 05/08/2016 0506   CREATININE 2.47 (H) 05/08/2016 0506   CALCIUM 8.9 05/08/2016 0506   GFRNONAA 24 (L) 05/08/2016 0506   GFRAA 27 (L) 05/08/2016 0506   CBC    Component Value Date/Time   WBC 4.7 05/08/2016 0506   RBC 2.69 (L) 05/08/2016 0506   HGB 7.8 (L) 05/08/2016 0506   HCT 23.7 (L) 05/08/2016 0506   PLT 98 (L)  05/08/2016 0506   MCV 88.1 05/08/2016 0506   MCH 29.0 05/08/2016 0506   MCHC 32.9 05/08/2016 0506   RDW 16.9 (H) 05/08/2016 0506   LYMPHSABS 1.0 04/17/2016 0823   MONOABS 0.8 04/17/2016 0823   EOSABS 0.1 04/17/2016 0823   BASOSABS 0.0 04/17/2016 0823    Assessment/Plan: 1. 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 S/p pulse solumedrol on po prednisone 3. Po cytoxan on hold due to acute GI bleed, to restart next week 4. TPE 1.5 PV x 7 qod, albumin colloid received on 2/14, 2/19, 2/20, 2/22, 2/24, and last for tomorrow 05/09/16 2. Upper GI bleed 04/28/16 due to duodenal ulcer s/p clipping on PPI 1. Recurrent bleed s/p EGD 05/07/16 which showed 35mm duodenal ulcer with exposed vessel s/p Argon laser cauterization by GI 3. ABLA 4. Hypernatremia- increase po intake especially water 5. DM 6. Vascular access- Right IJ TDC placed 04/22/16 7. Disposition- hopefully will not require longterm HD.  Will need to restart cytoxan soon and f/u with Dr. Joelyn Oms as an outpatient.  HD catheter may be discontinued after his last TPE treatment if Scr continues to improve without TPE.  Donetta Potts, MD [employer] [phone]

## 2016-05-08 NOTE — Progress Notes (Signed)
[facility] PROGRESS NOTE Subjective patient notes he had his 1st Brown stools morning in several weeks. Hemoglobin did drop just a bit.  Objective: Vital signs in last 24 hours: Temp:  [97.1 F (36.2 C)-98.7 F (37.1 C)] 98.4 F (36.9 C) (02/25 0742) Pulse Rate:  [43-55] 54 (02/25 0543) Resp:  [15-20] 17 (02/25 0742) BP: (133-181)/(42-81) 169/78 (02/25 0742) SpO2:  [95 %-100 %] 99 % (02/25 0742) Weight:  [83.4 kg (183 lb 12.8 oz)] 83.4 kg (183 lb 12.8 oz) (02/25 0543) Last BM Date: 05/07/16  Intake/Output from previous day: 02/24 0701 - 02/25 0700 In: 540 [P.O.:250; IV Piggyback:290] Out: 2800 [Urine:2800] Intake/Output this shift: No intake/output data recorded.  PE:  Abdomen-- nontender  Lab Results:  Recent Labs  05/06/16 0351 05/07/16 0435 05/08/16 0506  WBC 5.0 5.8 4.7  HGB 6.3* 8.6* 7.8*  HCT 19.0* 25.7* 23.7*  PLT 91* 120* 98*   BMET  Recent Labs  05/06/16 0351 05/07/16 0435 05/08/16 0506  NA 141 143 146*  K 4.1 5.0 3.8  CL 112* 112* 113*  CO2 23 21* 24  CREATININE 2.74* 2.66* 2.47*   LFT No results for input(s): PROT, AST, ALT, ALKPHOS, BILITOT, BILIDIR, IBILI in the last 72 hours. PT/INR No results for input(s): LABPROT, INR in the last 72 hours. PANCREAS No results for input(s): LIPASE in the last 72 hours.       Studies/Results: Dg Chest 2 View  Result Date: 05/07/2016 CLINICAL DATA:  Cough for 2 days. Sore throat, chest pain and shortness of breath. EXAM: CHEST  2 VIEW COMPARISON:  04/17/16 FINDINGS: Right chest wall dialysis catheter is noted with tip in the projection of the SVC. Aortic atherosclerosis is identified. Normal heart size. Trace bilateral pleural effusions identified. No airspace opacities. IMPRESSION: 1. Trace pleural effusions. 2. Aortic atherosclerosis. Electronically Signed   By: Kerby Moors M.D.   On: 05/07/2016 15:09    Medications: I have reviewed the patient's current  medications.  Assessment/Plan: 1. Duodenal ulcer with bleeding. Repeat EGD yesterday showed small visible vessel in this area was cauterized with APC.. Encouraging that he had a brown stool. Hopefully the ulcer will continue to heal and we will not need to be concerned about any further. It's notable that after a week of treatment it did appear much better and possibly 30% healed compared to the initial EGD   Kade Rickels JR,Logann Whitebread L 05/08/2016, 10:20 AM  This note was created using voice recognition software. Minor errors may Have occurred unintentionally.  Pager: [phone] If no answer or after hours call [phone]

## 2016-05-08 NOTE — Progress Notes (Signed)
PROGRESS NOTE    PRESTAN BRAND  [id number] DOB: 04-29-1940 DOA: 04/16/2016 PCP: Kandice Hams, MD   Brief Narrative: 76 year old male with history of BPH, gout, recurrent sinus infection, hypothyroidism admitted with worsening renal failure. Patient was evaluated by nephrologist and underwent kidney biopsy. 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 Patient received 2 sessions of hemodialysis treatment. Now receiving plasma exchange to my steroid for ANCA glomerulonephritis. Patient also with upper GI bleeding for which patient was evaluated by gastroenterologist.  Assessment & Plan:  # Acute kidney injury due to MPO ANCA vasculitis with RPGN: -Status post kidney biopsy on 04/20/16  with pauci immune GN, 40% crescents, ATN, minimal scarring -S/p pulse solumedrol, now on po prednisone -Po cytoxan on hold due to acute GI bleed,  restart next week as per renal and GI team.  -Receiving plasma exchange, last dose on 05/09/16 as per nephrologist -Patient is non-oliguric and serum creatinine level is trending down. Continue to monitor BMP and urine output.  #Upper GI bleed due to duodenal ulcer status post clipping and the patient is currently on PPI. -s/p EGD 05/07/16 which showed 24mm duodenal ulcer with exposed vessel s/p Argon laser cauterization by GI -No bowel movement today. Continue to monitor.  #Acute blood loss anemia: In the setting of GI bleed and due to renal failure and possibly in the setting of Cytoxan. -Hemoglobin is trending down to 7.8 today. Continue to monitor BMP. -Patient also with thrombocytopenia. Monitor CBC.   #Sepsis of unclear source on admission, resolved now.  #Metabolic encephalopathy, acute: MRI showed no acute finding. Mental status improved.  #Hypothyroidism: Continue Synthroid  #Hyperglycemia likely due to steroid: Continue to monitor blood sugar level. On sliding scale.  Principal Problem:   Acute glomerulonephritis Active Problems:  AKI (acute kidney injury) ([place])   Abnormal transaminases   Frequent headaches   Hypothyroidism   Hyponatremia   Sepsis ([place])   Metabolic encephalopathy   ANCA-associated vasculitis (HCC)   Acute renal failure (HCC)  DVT prophylaxis: SCDs Code Status: Full code Family Communication: No family present at bedside. I discussed with the patient in detail at bedside Disposition Plan: Likely discharge home in 2-3 days.    Consultants:   Nephrologist  GI  General surgery  Hematology oncology  Procedures: Kidney biopsy on February 7 Placement of hemodialysis catheter on February 9 Echocardiogram Plasma exchange started on February 12  Antimicrobials: Initially started on vancomycin and Zosyn on admission and discontinued later.   Subjective: Patient was seen and examined at bedside. Reported doing well. Denied headache, dizziness, nausea, vertigo chest pain or shortness of breath. No bowel movement today.  Objective: Vitals:   05/08/16 0009 05/08/16 0011 05/08/16 0543 05/08/16 0742  BP: (!) 181/70 (!) 159/70 (!) 168/60 (!) 169/78  Pulse: (!) 50 (!) 44 (!) 54   Resp:   18 17  Temp:   98.6 F (37 C) 98.4 F (36.9 C)  TempSrc:   Oral Oral  SpO2:   100% 99%  Weight:   83.4 kg (183 lb 12.8 oz)   Height:        Intake/Output Summary (Last 24 hours) at 05/08/16 1502 Last data filed at 05/08/16 1300  Gross per 24 hour  Intake              840 ml  Output             3250 ml  Net            -  2410 ml   Filed Weights   05/06/16 0653 05/07/16 0500 05/08/16 0543  Weight: 82 kg (180 lb 11.2 oz) 82.7 kg (182 lb 6.4 oz) 83.4 kg (183 lb 12.8 oz)    Examination:  General exam: Appears calm and comfortable  Respiratory system: Clear to auscultation. Respiratory effort normal. No wheezing or crackle Cardiovascular system: S1 & S2 heard, RRR.   Gastrointestinal system: Abdomen is nondistended, soft and nontender. Normal bowel sounds heard. Central nervous system: Alert and  oriented. No focal neurological deficits. Extremities: Symmetric 5 x 5 power.Bilateral lower extremities pitting edema. Skin: No rashes, lesions or ulcers Psychiatry: Judgement and insight appear normal. Mood & affect appropriate.     Data Reviewed: I have personally reviewed following labs and imaging studies  CBC:  Recent Labs Lab 05/04/16 0548 05/05/16 0341 05/06/16 0351 05/07/16 0435 05/08/16 0506  WBC 4.0 6.0 5.0 5.8 4.7  HGB 7.4* 7.3* 6.3* 8.6* 7.8*  HCT 22.2* 21.7* 19.0* 25.7* 23.7*  MCV 84.1 84.4 86.8 87.7 88.1  PLT 108* 113* 91* 120* 98*   Basic Metabolic Panel:  Recent Labs Lab 05/02/16 0952  05/03/16 1016 05/04/16 0548 05/05/16 0341 05/06/16 0351 05/07/16 0435 05/08/16 0506  NA  --   < >  --  141 141 141 143 146*  K  --   < >  --  4.5 4.2 4.1 5.0 3.8  CL  --   < >  --  112* 113* 112* 112* 113*  CO2  --   < >  --  21* 22 23 21* 24  GLUCOSE  --   < >  --  166* 106* 146* 96 94  BUN  --   < >  --  54* 53* 43* 44* 34*  CREATININE  --   < >  --  3.02* 2.89* 2.74* 2.66* 2.47*  CALCIUM  --   < >  --  8.0* 8.7* 8.8* 9.1 8.9  MG 1.5*  --  1.7  --   --   --   --   --   PHOS  --   --   --   --  4.0  --  3.9  --   < > = values in this interval not displayed. GFR: Estimated Creatinine Clearance: 24.9 mL/min (by C-G formula based on SCr of 2.47 mg/dL (H)). Liver Function Tests:  Recent Labs Lab 05/02/16 0410 05/04/16 0548 05/05/16 0341 05/07/16 0435  AST 12* 13*  --   --   ALT 15* 15*  --   --   ALKPHOS 59 34*  --   --   BILITOT 0.7 0.8  --   --   PROT 4.5* 5.1*  --   --   ALBUMIN 3.0* 4.0 4.0 4.5   No results for input(s): LIPASE, AMYLASE in the last 168 hours. No results for input(s): AMMONIA in the last 168 hours. Coagulation Profile:  Recent Labs Lab 05/03/16 0908  INR 2.14   Cardiac Enzymes:  Recent Labs Lab 05/02/16 0952 05/02/16 1500  TROPONINI <0.03 <0.03   BNP (last 3 results) No results for input(s): PROBNP in the last 8760  hours. HbA1C: No results for input(s): HGBA1C in the last 72 hours. CBG:  Recent Labs Lab 05/07/16 0950 05/07/16 1201 05/07/16 2147 05/08/16 0738 05/08/16 1136  GLUCAP 104* 208* 163* 92 121*   Lipid Profile: No results for input(s): CHOL, HDL, LDLCALC, TRIG, CHOLHDL, LDLDIRECT in the last 72 hours. Thyroid Function Tests: No results for  input(s): TSH, T4TOTAL, FREET4, T3FREE, THYROIDAB in the last 72 hours. Anemia Panel: No results for input(s): VITAMINB12, FOLATE, FERRITIN, TIBC, IRON, RETICCTPCT in the last 72 hours. Sepsis Labs: No results for input(s): PROCALCITON, LATICACIDVEN in the last 168 hours.  Recent Results (from the past 240 hour(s))  MRSA PCR Screening     Status: None   Collection Time: 04/29/16  4:42 PM  Result Value Ref Range Status   MRSA by PCR NEGATIVE NEGATIVE Final    Comment:        The GeneXpert MRSA Assay (FDA approved for NASAL specimens only), is one component of a comprehensive MRSA colonization surveillance program. It is not intended to diagnose MRSA infection nor to guide or monitor treatment for MRSA infections.          Radiology Studies: Dg Chest 2 View  Result Date: 05/07/2016 CLINICAL DATA:  Cough for 2 days. Sore throat, chest pain and shortness of breath. EXAM: CHEST  2 VIEW COMPARISON:  04/17/16 FINDINGS: Right chest wall dialysis catheter is noted with tip in the projection of the SVC. Aortic atherosclerosis is identified. Normal heart size. Trace bilateral pleural effusions identified. No airspace opacities. IMPRESSION: 1. Trace pleural effusions. 2. Aortic atherosclerosis. Electronically Signed   By: Kerby Moors M.D.   On: 05/07/2016 15:09        Scheduled Meds: . gabapentin  100 mg Oral QHS  . insulin aspart  0-15 Units Subcutaneous TID WC  . insulin aspart  0-5 Units Subcutaneous QHS  . insulin aspart  3 Units Subcutaneous TID WC  . levothyroxine  200 mcg Oral QAC breakfast  . pantoprazole  40 mg Oral BID  .  polyethylene glycol  17 g Oral TID  . predniSONE  60 mg Oral Q breakfast  . sodium bicarbonate  650 mg Oral BID  . sulfamethoxazole-trimethoprim  1 tablet Oral Q M,W,F-1800   Continuous Infusions:   LOS: 22 days    Jaidah Lomax Tanna Furry, MD [facility] Pager [phone]  If 7PM-7AM, please contact night-coverage [email or web address] Password [email or web address] 05/08/2016, 3:02 PM

## 2016-05-09 ENCOUNTER — Encounter (HOSPITAL_COMMUNITY): Payer: Self-pay | Admitting: Gastroenterology

## 2016-05-09 DIAGNOSIS — N179 Acute kidney failure, unspecified: Secondary | ICD-10-CM

## 2016-05-09 DIAGNOSIS — D62 Acute posthemorrhagic anemia: Secondary | ICD-10-CM

## 2016-05-09 LAB — RENAL FUNCTION PANEL
ANION GAP: 7 (ref 5–15)
Albumin: 4.1 g/dL (ref 3.5–5.0)
BUN: 37 mg/dL — AB (ref 6–20)
CO2: 23 mmol/L (ref 22–32)
Calcium: 8.7 mg/dL — ABNORMAL LOW (ref 8.9–10.3)
Chloride: 112 mmol/L — ABNORMAL HIGH (ref 101–111)
Creatinine, Ser: 2.37 mg/dL — ABNORMAL HIGH (ref 0.61–1.24)
GFR calc Af Amer: 29 mL/min — ABNORMAL LOW (ref >60)
GFR calc non Af Amer: 25 mL/min — ABNORMAL LOW (ref >60)
GLUCOSE: 108 mg/dL — AB (ref 65–99)
POTASSIUM: 3.9 mmol/L (ref 3.5–5.1)
Phosphorus: 2.9 mg/dL (ref 2.5–4.6)
Sodium: 142 mmol/L (ref 135–145)

## 2016-05-09 LAB — HEMOGLOBIN AND HEMATOCRIT, BLOOD
HEMATOCRIT: 25.1 % — AB (ref 39.0–52.0)
HEMOGLOBIN: 8.3 g/dL — AB (ref 13.0–17.0)

## 2016-05-09 LAB — POCT I-STAT, CHEM 8
BUN: 36 mg/dL — AB (ref 6–20)
CHLORIDE: 104 mmol/L (ref 101–111)
CREATININE: 2.4 mg/dL — AB (ref 0.61–1.24)
Calcium, Ion: 0.55 mmol/L — CL (ref 1.15–1.40)
GLUCOSE: 188 mg/dL — AB (ref 65–99)
HCT: 20 % — ABNORMAL LOW (ref 39.0–52.0)
Hemoglobin: 6.8 g/dL — CL (ref 13.0–17.0)
Potassium: 3.4 mmol/L — ABNORMAL LOW (ref 3.5–5.1)
Sodium: 145 mmol/L (ref 135–145)
TCO2: 22 mmol/L (ref 0–100)

## 2016-05-09 LAB — CBC
HCT: 21.5 % — ABNORMAL LOW (ref 39.0–52.0)
HEMOGLOBIN: 7.2 g/dL — AB (ref 13.0–17.0)
MCH: 29.5 pg (ref 26.0–34.0)
MCHC: 33.5 g/dL (ref 30.0–36.0)
MCV: 88.1 fL (ref 78.0–100.0)
Platelets: 107 10*3/uL — ABNORMAL LOW (ref 150–400)
RBC: 2.44 MIL/uL — ABNORMAL LOW (ref 4.22–5.81)
RDW: 16.7 % — AB (ref 11.5–15.5)
WBC: 3.7 10*3/uL — ABNORMAL LOW (ref 4.0–10.5)

## 2016-05-09 LAB — GLUCOSE, CAPILLARY
Glucose-Capillary: 96 mg/dL (ref 65–99)
Glucose-Capillary: 140 mg/dL — ABNORMAL HIGH (ref 65–99)
Glucose-Capillary: 201 mg/dL — ABNORMAL HIGH (ref 65–99)
Glucose-Capillary: 122 mg/dL — ABNORMAL HIGH (ref 65–99)

## 2016-05-09 LAB — PREPARE RBC (CROSSMATCH)

## 2016-05-09 MED ORDER — CALCIUM CARBONATE ANTACID 500 MG PO CHEW
2.0000 | CHEWABLE_TABLET | ORAL | Status: AC
  Administered 2016-05-09: 400 mg via ORAL

## 2016-05-09 MED ORDER — SODIUM CHLORIDE 0.9 % IV SOLN
4.0000 g | Freq: Once | INTRAVENOUS | Status: DC
  Administered 2016-05-09: 4 g via INTRAVENOUS
  Filled 2016-05-09: qty 40

## 2016-05-09 MED ORDER — DIPHENHYDRAMINE HCL 25 MG PO CAPS
ORAL_CAPSULE | ORAL | Status: AC
  Filled 2016-05-09: qty 1

## 2016-05-09 MED ORDER — SODIUM CHLORIDE 0.9 % IV SOLN
INTRAVENOUS | Status: AC
  Administered 2016-05-09 (×4): via INTRAVENOUS_CENTRAL
  Filled 2016-05-09 (×4): qty 200

## 2016-05-09 MED ORDER — ACD FORMULA A 0.73-2.45-2.2 GM/100ML VI SOLN
Status: AC
  Administered 2016-05-09: 150 mL
  Filled 2016-05-09: qty 500

## 2016-05-09 MED ORDER — SODIUM CHLORIDE 0.9 % IV SOLN: Freq: Once | INTRAVENOUS | Status: AC

## 2016-05-09 MED ORDER — DIPHENHYDRAMINE HCL 50 MG/ML IJ SOLN
25.0000 mg | Freq: Once | INTRAMUSCULAR | Status: AC
  Administered 2016-05-09: 25 mg via INTRAVENOUS

## 2016-05-09 MED ORDER — CALCIUM CARBONATE ANTACID 500 MG PO CHEW
CHEWABLE_TABLET | ORAL | Status: AC
  Administered 2016-05-09: 400 mg via ORAL
  Filled 2016-05-09: qty 2

## 2016-05-09 MED ORDER — DIPHENHYDRAMINE HCL 25 MG PO CAPS
25.0000 mg | ORAL_CAPSULE | Freq: Four times a day (QID) | ORAL | Status: DC | PRN
  Administered 2016-05-09: 25 mg via ORAL

## 2016-05-09 MED ORDER — ACETAMINOPHEN 325 MG PO TABS: 650.0000 mg | ORAL_TABLET | ORAL | Status: DC | PRN

## 2016-05-09 MED ORDER — ANTICOAGULANT SODIUM CITRATE 4% (200MG/5ML) IV SOLN
5.0000 mL | Freq: Once | Status: AC
  Administered 2016-05-09: 5 mL
  Filled 2016-05-09: qty 250

## 2016-05-09 MED ORDER — ACD FORMULA A 0.73-2.45-2.2 GM/100ML VI SOLN
500.0000 mL | Status: DC
  Administered 2016-05-09: 500 mL via INTRAVENOUS
  Filled 2016-05-09: qty 500

## 2016-05-09 MED ORDER — CYCLOPHOSPHAMIDE 50 MG PO CAPS
50.0000 mg | ORAL_CAPSULE | Freq: Every day | ORAL | Status: DC
  Administered 2016-05-09 – 2016-05-13 (×5): 50 mg via ORAL
  Filled 2016-05-09 (×5): qty 1

## 2016-05-09 MED ORDER — DIPHENHYDRAMINE HCL 50 MG/ML IJ SOLN
INTRAMUSCULAR | Status: AC
  Administered 2016-05-09: 25 mg via INTRAVENOUS
  Filled 2016-05-09: qty 1

## 2016-05-09 MED ORDER — ACD FORMULA A 0.73-2.45-2.2 GM/100ML VI SOLN
Status: AC
  Administered 2016-05-09: 500 mL via INTRAVENOUS
  Filled 2016-05-09: qty 500

## 2016-05-09 NOTE — Progress Notes (Signed)
Nicholas Hudson 5:10 PM  Subjective: Patient familiar to me from 2 weekends ago and his hospital computer chart was reviewed and his case discussed with his wife as well and is not having any obvious bleeding and we discussed his previous colonoscopy by Dr. Wynetta Emery and he has no new complaints  Objective: Signs stable afebrile no acute distress abdomen is soft nontender hemoglobin decreased slightly BUN stable and creatinine slight decrease  Assessment: Subacute GI bleeding  Plan: We will continue to follow and we will review previous colonoscopy  [facility] E  Pager [phone] After 5PM or if no answer call [phone]

## 2016-05-09 NOTE — Progress Notes (Signed)
[facility] ROUNDING NOTE   Subjective:   Interval History:  AKI with MPO ANCA vasculitis and RPGN . Fairly favorable renal biopsy - continue  Prednisone and restart cytoxan . Developed acute GI bleed secondary to duodenal ulcer treated with argon laser cauterization  Objective:  Vital signs in last 24 hours:  Temp:  [98.5 F (36.9 C)-98.7 F (37.1 C)] 98.5 F (36.9 C) (02/26 0447) Pulse Rate:  [52-57] 57 (02/26 0447) Resp:  [20] 20 (02/25 2100) BP: (148-177)/(59-88) 148/59 (02/26 0447) SpO2:  [97 %-99 %] 97 % (02/26 0447) Weight:  [82.6 kg (182 lb 1.6 oz)] 82.6 kg (182 lb 1.6 oz) (02/26 0447)  Weight change: -0.771 kg (-1 lb 11.2 oz) Filed Weights   05/07/16 0500 05/08/16 0543 05/09/16 0447  Weight: 82.7 kg (182 lb 6.4 oz) 83.4 kg (183 lb 12.8 oz) 82.6 kg (182 lb 1.6 oz)    Intake/Output: I/O last 3 completed shifts: In: 1560 [P.O.:1560] Out: 3350 [Urine:3350]   Intake/Output this shift:  Total I/O In: -  Out: 200 [Urine:200]  CVS- RRR RS- CTA ABD- BS present soft non-distended EXT-  1 +  edema   Basic Metabolic Panel:  Recent Labs Lab 05/02/16 0952  05/03/16 1016  05/05/16 0341 05/06/16 0351 05/07/16 0435 05/08/16 0506 05/09/16 0419  NA  --   < >  --   < > 141 141 143 146* 142  K  --   < >  --   < > 4.2 4.1 5.0 3.8 3.9  CL  --   < >  --   < > 113* 112* 112* 113* 112*  CO2  --   < >  --   < > 22 23 21* 24 23  GLUCOSE  --   < >  --   < > 106* 146* 96 94 108*  BUN  --   < >  --   < > 53* 43* 44* 34* 37*  CREATININE  --   < >  --   < > 2.89* 2.74* 2.66* 2.47* 2.37*  CALCIUM  --   < >  --   < > 8.7* 8.8* 9.1 8.9 8.7*  MG 1.5*  --  1.7  --   --   --   --   --   --   PHOS  --   --   --   --  4.0  --  3.9  --  2.9  < > = values in this interval not displayed.  Liver Function Tests:  Recent Labs Lab 05/04/16 0548 05/05/16 0341 05/07/16 0435 05/09/16 0419  AST 13*  --   --   --   ALT 15*  --   --   --   ALKPHOS 34*  --   --   --    BILITOT 0.8  --   --   --   PROT 5.1*  --   --   --   ALBUMIN 4.0 4.0 4.5 4.1   No results for input(s): LIPASE, AMYLASE in the last 168 hours. No results for input(s): AMMONIA in the last 168 hours.  CBC:  Recent Labs Lab 05/05/16 0341 05/06/16 0351 05/07/16 0435 05/08/16 0506 05/09/16 0419  WBC 6.0 5.0 5.8 4.7 3.7*  HGB 7.3* 6.3* 8.6* 7.8* 7.2*  HCT 21.7* 19.0* 25.7* 23.7* 21.5*  MCV 84.4 86.8 87.7 88.1 88.1  PLT 113* 91* 120* 98* 107*    Cardiac Enzymes:  Recent Labs Lab 05/02/16  [id number] 05/02/16 1500  TROPONINI <0.03 <0.03    BNP: Invalid input(s): POCBNP  CBG:  Recent Labs Lab 05/08/16 0738 05/08/16 1136 05/08/16 1640 05/08/16 2056 05/09/16 0729  GLUCAP 92 121* 186* 121* 96    Microbiology: Results for orders placed or performed during the hospital encounter of 04/16/16  Culture, blood (Routine x 2)     Status: None   Collection Time: 04/16/16  3:32 PM  Result Value Ref Range Status   Specimen Description BLOOD RIGHT WRIST  Final   Special Requests BOTTLES DRAWN AEROBIC AND ANAEROBIC 5 CC EACH  Final   Culture   Final    NO GROWTH 5 DAYS Performed at [facility], [place] [address], [place], [place] [address]    Report Status 04/21/2016 FINAL  Final  Culture, blood (Routine x 2)     Status: None   Collection Time: 04/16/16  4:15 PM  Result Value Ref Range Status   Specimen Description BLOOD RIGHT HAND  Final   Special Requests BOTTLES DRAWN AEROBIC ONLY 5 CC  Final   Culture   Final    NO GROWTH 5 DAYS Performed at [facility], [place] [address], [place], [place] [address]    Report Status 04/21/2016 FINAL  Final  Urine culture     Status: None   Collection Time: 04/16/16  4:23 PM  Result Value Ref Range Status   Specimen Description URINE, RANDOM  Final   Special Requests NONE  Final   Culture   Final    NO GROWTH Performed at [facility], [place] [address] [place], [place] [address]    Report Status 04/18/2016 FINAL   Final  Culture, blood (x 2)     Status: None   Collection Time: 04/17/16  8:19 AM  Result Value Ref Range Status   Specimen Description BLOOD LEFT ANTECUBITAL  Final   Special Requests BOTTLES DRAWN AEROBIC AND ANAEROBIC 5CC  Final   Culture   Final    NO GROWTH 5 DAYS Performed at [facility], [place] [address], [place], [place] [address]    Report Status 04/22/2016 FINAL  Final  Culture, blood (x 2)     Status: None   Collection Time: 04/17/16  8:27 AM  Result Value Ref Range Status   Specimen Description BLOOD RIGHT HAND  Final   Special Requests BOTTLES DRAWN AEROBIC AND ANAEROBIC 5CC  Final   Culture   Final    NO GROWTH 5 DAYS Performed at [facility], [place] [address], [place],  [address]    Report Status 04/22/2016 FINAL  Final  MRSA PCR Screening     Status: None   Collection Time: 04/26/16  2:02 PM  Result Value Ref Range Status   MRSA by PCR NEGATIVE NEGATIVE Final    Comment:        The GeneXpert MRSA Assay (FDA approved for NASAL specimens only), is one component of a comprehensive MRSA colonization surveillance program. It is not intended to diagnose MRSA infection nor to guide or monitor treatment for MRSA infections.   MRSA PCR Screening     Status: None   Collection Time: 04/29/16  4:42 PM  Result Value Ref Range Status   MRSA by PCR NEGATIVE NEGATIVE Final    Comment:        The GeneXpert MRSA Assay (FDA approved for NASAL specimens only), is one component of a comprehensive MRSA colonization surveillance program. It is not  intended to diagnose MRSA infection nor to guide or monitor treatment for MRSA infections.     Coagulation Studies: No results for input(s): LABPROT, INR in the last 72 hours.  Urinalysis: No results for input(s): COLORURINE, LABSPEC, PHURINE, GLUCOSEU, HGBUR, BILIRUBINUR, KETONESUR, PROTEINUR, UROBILINOGEN, NITRITE, LEUKOCYTESUR in the last 72 hours.  Invalid input(s): APPERANCEUR    Imaging: Dg  Chest 2 View  Result Date: 05/07/2016 CLINICAL DATA:  Cough for 2 days. Sore throat, chest pain and shortness of breath. EXAM: CHEST  2 VIEW COMPARISON:  04/17/16 FINDINGS: Right chest wall dialysis catheter is noted with tip in the projection of the SVC. Aortic atherosclerosis is identified. Normal heart size. Trace bilateral pleural effusions identified. No airspace opacities. IMPRESSION: 1. Trace pleural effusions. 2. Aortic atherosclerosis. Electronically Signed   By: Kerby Moors M.D.   On: 05/07/2016 15:09     Medications:   . citrate dextrose     . sodium chloride   Intravenous Once  . therapeutic plasma exchange solution   Dialysis Q1 Hr x 4  . anticoagulant sodium citrate  5 mL Intracatheter Once  . calcium carbonate      . calcium carbonate  2 tablet Oral Q3H  . calcium gluconate IVPB  4 g Intravenous Once  . citrate dextrose      . gabapentin  100 mg Oral QHS  . insulin aspart  0-15 Units Subcutaneous TID WC  . insulin aspart  0-5 Units Subcutaneous QHS  . insulin aspart  3 Units Subcutaneous TID WC  . levothyroxine  200 mcg Oral QAC breakfast  . pantoprazole  40 mg Oral BID  . polyethylene glycol  17 g Oral TID  . predniSONE  60 mg Oral Q breakfast  . sodium bicarbonate  650 mg Oral BID  . sulfamethoxazole-trimethoprim  1 tablet Oral Q M,W,F-1800   acetaminophen **OR** acetaminophen, acetaminophen, diphenhydrAMINE, hydrALAZINE, ondansetron **OR** ondansetron (ZOFRAN) IV, traMADol, zolpidem  Assessment/ Plan:   AKI with MPO vasculitis  Prednisone and TPE , will restart cytoxan at 50mg  daily - noted a drop in wbc < 4000  Hopefully will not require long term dialysis will continue to follow and watch for recovery of renal function    LOS: 23 Jay[place] W @TODAY @8 :04 AM

## 2016-05-09 NOTE — Progress Notes (Signed)
After completion of plasmapharesis and was about to call report noted pt face with hives and pt was itching. Pt given Benadryl 25mg  PO; waited 20 min; pt states he is not itching as much but still has redness on face; torso and buttocks. Called Dr. Edrick Oh and he ordered Benadryl 25mg  IV; pt states he is not itching; redness resolving in all aforementioned area,  pt denies SOB, dizziness, or pain.

## 2016-05-09 NOTE — Care Management Important Message (Signed)
Important Message  Patient Details  Name: Nicholas Hudson MRN: [id number] Date of Birth: [birth year]   Medicare Important Message Given:  Yes    Nathen May 05/09/2016, 1:38 PM

## 2016-05-09 NOTE — Progress Notes (Signed)
RN ordered a recheck for H&H prior to blood administration at 1330 being that pt hemoglobin was 7.2 this am,  Doctor agreed. Lab notified, will await results

## 2016-05-09 NOTE — Progress Notes (Signed)
Pt HGB repeat was 8.3. No blood transfusion. MD notified

## 2016-05-09 NOTE — Progress Notes (Signed)
PROGRESS NOTE    Nicholas Hudson  [id number] DOB: 08-21-1940 DOA: 04/16/2016 PCP: Kandice Hams, MD   Brief Narrative: 76 year old male with history of BPH, gout, recurrent sinus infection, hypothyroidism admitted with worsening renal failure. Patient was evaluated by nephrologist and underwent kidney biopsy. 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 Patient received 2 sessions of hemodialysis treatment. Now receiving plasma exchange to my steroid for ANCA glomerulonephritis. Patient also with upper GI bleeding for which patient was evaluated by gastroenterologist.  Assessment & Plan:  # Acute kidney injury due to MPO ANCA vasculitis with RPGN: -Status post kidney biopsy on 04/20/16  with pauci immune GN, 40% crescents, ATN, minimal scarring -S/p pulse solumedrol, now on po prednisone -resumed cytoxan 50 mg by nephrologist today. Monitor CBC  -Receiving plasma exchange, last dose on 05/09/16 as per nephrologist, completing today. -Patient is non-oliguric and serum creatinine level is trending down tp 2.3. Continue to monitor BMP and urine output.  #Upper GI bleed due to duodenal ulcer status post clipping and the patient is currently on PPI. -s/p EGD 05/07/16 which showed 34mm duodenal ulcer with exposed vessel s/p Argon laser cauterization by GI -No bowel movement today. Hb dropping, further care as per GI team.   #Acute blood loss anemia: Anemia multifactorial including In the setting of GI bleed, due to renal failure and possibly in the setting of Cytoxan. -Hemoglobin is trending down to 6.8 today. No sign of active bleed. Plan to transfuse 2 units of PRBC today.  -Patient also with thrombocytopenia. Monitor CBC.   #Sepsis of unclear source on admission, resolved now.  #Metabolic encephalopathy, acute: MRI showed no acute finding. Mental status improved.  #Hypothyroidism: Continue Synthroid  #Hyperglycemia likely due to steroid: Continue to monitor blood sugar level.  On sliding scale.  Principal Problem:   Acute glomerulonephritis Active Problems:   AKI (acute kidney injury) ([place])   Abnormal transaminases   Frequent headaches   Hypothyroidism   Hyponatremia   Sepsis ([place])   Metabolic encephalopathy   ANCA-associated vasculitis (HCC)   Acute renal failure (HCC)  DVT prophylaxis: SCDs . The patient has severe anemia and thrombocytopenia Code Status: Full code Family Communication: No family present at bedside.  Disposition Plan: Likely discharge home in 2-3 days.    Consultants:   Nephrologist  GI  General surgery  Hematology oncology  Procedures: Kidney biopsy on February 7 Placement of hemodialysis catheter on February 9 Echocardiogram Plasma exchange started on February 12  Antimicrobials: Initially started on vancomycin and Zosyn on admission and discontinued later.   Subjective: Patient was seen and examined at dialysis unit. Patient was receiving plasma exchange. Reported doing well and has weakness. Denied headache, dizziness, nausea vomiting chest pain or shortness of breath.  Objective: Vitals:   05/09/16 1056 05/09/16 1101 05/09/16 1115 05/09/16 1206  BP: (!) 153/63 (!) 153/63 (!) 155/67 138/68  Pulse:      Resp: 16 (!) 21 15 15   Temp: 97.8 F (36.6 C) 97.8 F (36.6 C) 98 F (36.7 C) 98.1 F (36.7 C)  TempSrc: Oral Oral Oral Oral  SpO2: 99% 99% 99%   Weight:      Height:        Intake/Output Summary (Last 24 hours) at 05/09/16 1255 Last data filed at 05/09/16 1037  Gross per 24 hour  Intake             1478 ml  Output  2150 ml  Net             -672 ml   Filed Weights   05/07/16 0500 05/08/16 0543 05/09/16 0447  Weight: 82.7 kg (182 lb 6.4 oz) 83.4 kg (183 lb 12.8 oz) 82.6 kg (182 lb 1.6 oz)    Examination:  General exam: Appears calm and comfortable, not in distress. Looks pale Respiratory system: Clear to auscultation bilateral. Respiratory effort normal. No wheezing or  crackle Cardiovascular system: S1 & S2 heard, RRR.   Gastrointestinal system: Abdomen soft, nontender, nondistended. Bowel sound positive Central nervous system: Alert and oriented. No focal neurological deficits. Extremities: Symmetric 5 x 5 power.bilateral lower extremity pitting edema unchanged Skin: No rashes, lesions or ulcers Psychiatry: Judgement and insight appear normal. Mood & affect appropriate.     Data Reviewed: I have personally reviewed following labs and imaging studies  CBC:  Recent Labs Lab 05/05/16 0341 05/06/16 0351 05/07/16 0435 05/08/16 0506 05/09/16 0419 05/09/16 0858  WBC 6.0 5.0 5.8 4.7 3.7*  --   HGB 7.3* 6.3* 8.6* 7.8* 7.2* 6.8*  HCT 21.7* 19.0* 25.7* 23.7* 21.5* 20.0*  MCV 84.4 86.8 87.7 88.1 88.1  --   PLT 113* 91* 120* 98* 107*  --    Basic Metabolic Panel:  Recent Labs Lab 05/03/16 1016  05/05/16 0341 05/06/16 0351 05/07/16 0435 05/08/16 0506 05/09/16 0419 05/09/16 0858  NA  --   < > 141 141 143 146* 142 145  K  --   < > 4.2 4.1 5.0 3.8 3.9 3.4*  CL  --   < > 113* 112* 112* 113* 112* 104  CO2  --   < > 22 23 21* 24 23  --   GLUCOSE  --   < > 106* 146* 96 94 108* 188*  BUN  --   < > 53* 43* 44* 34* 37* 36*  CREATININE  --   < > 2.89* 2.74* 2.66* 2.47* 2.37* 2.40*  CALCIUM  --   < > 8.7* 8.8* 9.1 8.9 8.7*  --   MG 1.7  --   --   --   --   --   --   --   PHOS  --   --  4.0  --  3.9  --  2.9  --   < > = values in this interval not displayed. GFR: Estimated Creatinine Clearance: 25.5 mL/min (by C-G formula based on SCr of 2.4 mg/dL (H)). Liver Function Tests:  Recent Labs Lab 05/04/16 0548 05/05/16 0341 05/07/16 0435 05/09/16 0419  AST 13*  --   --   --   ALT 15*  --   --   --   ALKPHOS 34*  --   --   --   BILITOT 0.8  --   --   --   PROT 5.1*  --   --   --   ALBUMIN 4.0 4.0 4.5 4.1   No results for input(s): LIPASE, AMYLASE in the last 168 hours. No results for input(s): AMMONIA in the last 168 hours. Coagulation  Profile:  Recent Labs Lab 05/03/16 0908  INR 2.14   Cardiac Enzymes:  Recent Labs Lab 05/02/16 1500  TROPONINI <0.03   BNP (last 3 results) No results for input(s): PROBNP in the last 8760 hours. HbA1C: No results for input(s): HGBA1C in the last 72 hours. CBG:  Recent Labs Lab 05/08/16 0738 05/08/16 1136 05/08/16 1640 05/08/16 2056 05/09/16 0729  GLUCAP 92 121* 186*  121* 96   Lipid Profile: No results for input(s): CHOL, HDL, LDLCALC, TRIG, CHOLHDL, LDLDIRECT in the last 72 hours. Thyroid Function Tests: No results for input(s): TSH, T4TOTAL, FREET4, T3FREE, THYROIDAB in the last 72 hours. Anemia Panel: No results for input(s): VITAMINB12, FOLATE, FERRITIN, TIBC, IRON, RETICCTPCT in the last 72 hours. Sepsis Labs: No results for input(s): PROCALCITON, LATICACIDVEN in the last 168 hours.  Recent Results (from the past 240 hour(s))  MRSA PCR Screening     Status: None   Collection Time: 04/29/16  4:42 PM  Result Value Ref Range Status   MRSA by PCR NEGATIVE NEGATIVE Final    Comment:        The GeneXpert MRSA Assay (FDA approved for NASAL specimens only), is one component of a comprehensive MRSA colonization surveillance program. It is not intended to diagnose MRSA infection nor to guide or monitor treatment for MRSA infections.          Radiology Studies: Dg Chest 2 View  Result Date: 05/07/2016 CLINICAL DATA:  Cough for 2 days. Sore throat, chest pain and shortness of breath. EXAM: CHEST  2 VIEW COMPARISON:  04/17/16 FINDINGS: Right chest wall dialysis catheter is noted with tip in the projection of the SVC. Aortic atherosclerosis is identified. Normal heart size. Trace bilateral pleural effusions identified. No airspace opacities. IMPRESSION: 1. Trace pleural effusions. 2. Aortic atherosclerosis. Electronically Signed   By: Kerby Moors M.D.   On: 05/07/2016 15:09        Scheduled Meds: . sodium chloride   Intravenous Once  . sodium chloride    Intravenous Once  . cyclophosphamide  50 mg Oral Daily  . gabapentin  100 mg Oral QHS  . insulin aspart  0-15 Units Subcutaneous TID WC  . insulin aspart  0-5 Units Subcutaneous QHS  . insulin aspart  3 Units Subcutaneous TID WC  . levothyroxine  200 mcg Oral QAC breakfast  . pantoprazole  40 mg Oral BID  . polyethylene glycol  17 g Oral TID  . predniSONE  60 mg Oral Q breakfast  . sodium bicarbonate  650 mg Oral BID  . sulfamethoxazole-trimethoprim  1 tablet Oral Q M,W,F-1800   Continuous Infusions:   LOS: 23 days    Ranae Casebier Tanna Furry, MD [facility] Pager [phone]  If 7PM-7AM, please contact night-coverage [email or web address] Password [facility] [place] 05/09/2016, 12:55 PM

## 2016-05-10 LAB — PREPARE FRESH FROZEN PLASMA
BLOOD PRODUCT EXPIRATION DATE: 201802272359
BLOOD PRODUCT EXPIRATION DATE: 201803012359
BLOOD PRODUCT EXPIRATION DATE: 201803012359
Blood Product Expiration Date: 201803012359
Blood Product Expiration Date: 201803022359
Blood Product Expiration Date: 201803022359
Blood Product Expiration Date: 201803022359
ISSUE DATE / TIME: 201802252209
ISSUE DATE / TIME: 201802252238
ISSUE DATE / TIME: 201802260933
ISSUE DATE / TIME: 201802252238
ISSUE DATE / TIME: 201802260933
ISSUE DATE / TIME: 201802260933
ISSUE DATE / TIME: 201802260933
UNIT TYPE AND RH: 6200
UNIT TYPE AND RH: 6200
UNIT TYPE AND RH: 6200
Unit Type and Rh: 6200
Unit Type and Rh: 6200
Unit Type and Rh: 6200
Unit Type and Rh: 6200

## 2016-05-10 LAB — TYPE AND SCREEN
BLOOD PRODUCT EXPIRATION DATE: 201803022359
BLOOD PRODUCT EXPIRATION DATE: 201803102359
Blood Product Expiration Date: 201803112359
ISSUE DATE / TIME: 201802161240
ISSUE DATE / TIME: 201802231248
UNIT TYPE AND RH: 6200
UNIT TYPE AND RH: 6200
Unit Type and Rh: 6200

## 2016-05-10 LAB — RENAL FUNCTION PANEL
ALBUMIN: 4.5 g/dL (ref 3.5–5.0)
Anion gap: 8 (ref 5–15)
BUN: 37 mg/dL — ABNORMAL HIGH (ref 6–20)
CALCIUM: 8.9 mg/dL (ref 8.9–10.3)
CO2: 25 mmol/L (ref 22–32)
Chloride: 110 mmol/L (ref 101–111)
Creatinine, Ser: 2.24 mg/dL — ABNORMAL HIGH (ref 0.61–1.24)
GFR calc Af Amer: 31 mL/min — ABNORMAL LOW (ref >60)
GFR, EST NON AFRICAN AMERICAN: 27 mL/min — AB (ref >60)
Glucose, Bld: 102 mg/dL — ABNORMAL HIGH (ref 65–99)
PHOSPHORUS: 3.2 mg/dL (ref 2.5–4.6)
POTASSIUM: 3.7 mmol/L (ref 3.5–5.1)
SODIUM: 143 mmol/L (ref 135–145)

## 2016-05-10 LAB — CBC
HEMATOCRIT: 23.9 % — AB (ref 39.0–52.0)
HEMOGLOBIN: 7.9 g/dL — AB (ref 13.0–17.0)
MCH: 29.4 pg (ref 26.0–34.0)
MCHC: 33.1 g/dL (ref 30.0–36.0)
MCV: 88.8 fL (ref 78.0–100.0)
Platelets: 143 10*3/uL — ABNORMAL LOW (ref 150–400)
RBC: 2.69 MIL/uL — ABNORMAL LOW (ref 4.22–5.81)
RDW: 17 % — AB (ref 11.5–15.5)
WBC: 5.4 10*3/uL (ref 4.0–10.5)

## 2016-05-10 LAB — GLUCOSE, CAPILLARY
GLUCOSE-CAPILLARY: 100 mg/dL — AB (ref 65–99)
GLUCOSE-CAPILLARY: 156 mg/dL — AB (ref 65–99)
Glucose-Capillary: 148 mg/dL — ABNORMAL HIGH (ref 65–99)
Glucose-Capillary: 136 mg/dL — ABNORMAL HIGH (ref 65–99)

## 2016-05-10 NOTE — Progress Notes (Signed)
Nicholas Hudson 11:20 AM  Subjective: Patient without any new complaints and his case discussed with his son as well stool dark brown I did look at it  Objective: Vital signs stable afebrile no acute distress abdomen is soft nontender hemoglobin okay BUN and creatinine stable  Assessment: GI blood loss currently stable  Plan: Will continue to follow while in the hospital however okay with me to go home with close outpatient follow-up with Dr. Oletta Lamas  [facility]  Pager [phone] After 5PM or if no answer call [phone]

## 2016-05-10 NOTE — Progress Notes (Signed)
[facility] ROUNDING NOTE   Subjective:   Interval History:  AKI with MPO ANCA vasculitis and RPGN . Fairly favorable renal biopsy - continue  Prednisone and restart cytoxan . Developed acute GI bleed secondary to duodenal ulcer treated with argon laser cauterization  Objective:  Vital signs in last 24 hours:  Temp:  [97.8 F (36.6 C)-98.9 F (37.2 C)] 98.9 F (37.2 C) (02/27 0415) Pulse Rate:  [58-63] 63 (02/27 0415) Resp:  [13-23] 15 (02/26 1206) BP: (133-161)/(58-94) 143/94 (02/27 0415) SpO2:  [99 %-100 %] 99 % (02/27 0415) Weight:  [81.8 kg (180 lb 6.4 oz)] 81.8 kg (180 lb 6.4 oz) (02/27 0415)  Weight change: -0.771 kg (-1 lb 11.2 oz) Filed Weights   05/08/16 0543 05/09/16 0447 05/10/16 0415  Weight: 83.4 kg (183 lb 12.8 oz) 82.6 kg (182 lb 1.6 oz) 81.8 kg (180 lb 6.4 oz)    Intake/Output: I/O last 3 completed shifts: In: [id number] [P.O.:1320; Blood:278] Out: [id number] [Urine:4750]   Intake/Output this shift:  No intake/output data recorded.  CVS- RRR RS- CTA ABD- BS present soft non-distended EXT- trace edema   Basic Metabolic Panel:  Recent Labs Lab 05/03/16 1016  05/05/16 0341 05/06/16 0351 05/07/16 0435 05/08/16 0506 05/09/16 0419 05/09/16 0858 05/10/16 0502  NA  --   < > 141 141 143 146* 142 145 143  K  --   < > 4.2 4.1 5.0 3.8 3.9 3.4* 3.7  CL  --   < > 113* 112* 112* 113* 112* 104 110  CO2  --   < > 22 23 21* 24 23  --  25  GLUCOSE  --   < > 106* 146* 96 94 108* 188* 102*  BUN  --   < > 53* 43* 44* 34* 37* 36* 37*  CREATININE  --   < > 2.89* 2.74* 2.66* 2.47* 2.37* 2.40* 2.24*  CALCIUM  --   < > 8.7* 8.8* 9.1 8.9 8.7*  --  8.9  MG 1.7  --   --   --   --   --   --   --   --   PHOS  --   --  4.0  --  3.9  --  2.9  --  3.2  < > = values in this interval not displayed.  Liver Function Tests:  Recent Labs Lab 05/04/16 0548 05/05/16 0341 05/07/16 0435 05/09/16 0419 05/10/16 0502  AST 13*  --   --   --   --   ALT 15*  --   --   --   --    ALKPHOS 34*  --   --   --   --   BILITOT 0.8  --   --   --   --   PROT 5.1*  --   --   --   --   ALBUMIN 4.0 4.0 4.5 4.1 4.5   No results for input(s): LIPASE, AMYLASE in the last 168 hours. No results for input(s): AMMONIA in the last 168 hours.  CBC:  Recent Labs Lab 05/06/16 0351 05/07/16 0435 05/08/16 0506 05/09/16 0419 05/09/16 0858 05/09/16 1446 05/10/16 0502  WBC 5.0 5.8 4.7 3.7*  --   --  5.4  HGB 6.3* 8.6* 7.8* 7.2* 6.8* 8.3* 7.9*  HCT 19.0* 25.7* 23.7* 21.5* 20.0* 25.1* 23.9*  MCV 86.8 87.7 88.1 88.1  --   --  88.8  PLT 91* 120* 98* 107*  --   --  143*    Cardiac Enzymes: No results for input(s): CKTOTAL, CKMB, CKMBINDEX, TROPONINI in the last 168 hours.  BNP: Invalid input(s): POCBNP  CBG:  Recent Labs Lab 05/09/16 0729 05/09/16 1311 05/09/16 1613 05/09/16 2115 05/10/16 0724  GLUCAP 96 140* 201* 122* 100*    Microbiology: Results for orders placed or performed during the hospital encounter of 04/16/16  Culture, blood (Routine x 2)     Status: None   Collection Time: 04/16/16  3:32 PM  Result Value Ref Range Status   Specimen Description BLOOD RIGHT WRIST  Final   Special Requests BOTTLES DRAWN AEROBIC AND ANAEROBIC 5 CC EACH  Final   Culture   Final    NO GROWTH 5 DAYS Performed at [facility], [place] [address], [place], [place] [address]    Report Status 04/21/2016 FINAL  Final  Culture, blood (Routine x 2)     Status: None   Collection Time: 04/16/16  4:15 PM  Result Value Ref Range Status   Specimen Description BLOOD RIGHT HAND  Final   Special Requests BOTTLES DRAWN AEROBIC ONLY 5 CC  Final   Culture   Final    NO GROWTH 5 DAYS Performed at [facility], [place] [address], [place], [place] [address]    Report Status 04/21/2016 FINAL  Final  Urine culture     Status: None   Collection Time: 04/16/16  4:23 PM  Result Value Ref Range Status   Specimen Description URINE, RANDOM  Final   Special Requests NONE  Final   Culture    Final    NO GROWTH Performed at [facility], [place] [address], [place], [place] [address]    Report Status 04/18/2016 FINAL  Final  Culture, blood (x 2)     Status: None   Collection Time: 04/17/16  8:19 AM  Result Value Ref Range Status   Specimen Description BLOOD LEFT ANTECUBITAL  Final   Special Requests BOTTLES DRAWN AEROBIC AND ANAEROBIC 5CC  Final   Culture   Final    NO GROWTH 5 DAYS Performed at [facility], [place] [address] [place], [place] [address]    Report Status 04/22/2016 FINAL  Final  Culture, blood (x 2)     Status: None   Collection Time: 04/17/16  8:27 AM  Result Value Ref Range Status   Specimen Description BLOOD RIGHT HAND  Final   Special Requests BOTTLES DRAWN AEROBIC AND ANAEROBIC 5CC  Final   Culture   Final    NO GROWTH 5 DAYS Performed at [facility], [place] [address] [place], [place] [address]    Report Status 04/22/2016 FINAL  Final  MRSA PCR Screening     Status: None   Collection Time: 04/26/16  2:02 PM  Result Value Ref Range Status   MRSA by PCR NEGATIVE NEGATIVE Final    Comment:        The GeneXpert MRSA Assay (FDA approved for NASAL specimens only), is one component of a comprehensive MRSA colonization surveillance program. It is not intended to diagnose MRSA infection nor to guide or monitor treatment for MRSA infections.   MRSA PCR Screening     Status: None   Collection Time: 04/29/16  4:42 PM  Result Value Ref Range Status   MRSA by PCR NEGATIVE NEGATIVE Final    Comment:        The GeneXpert MRSA Assay (FDA approved for NASAL specimens only), is one component  of a comprehensive MRSA colonization surveillance program. It is not intended to diagnose MRSA infection nor to guide or monitor treatment for MRSA infections.     Coagulation Studies: No results for input(s): LABPROT, INR in the last 72 hours.  Urinalysis: No results for input(s): COLORURINE, LABSPEC, PHURINE, GLUCOSEU, HGBUR, BILIRUBINUR,  KETONESUR, PROTEINUR, UROBILINOGEN, NITRITE, LEUKOCYTESUR in the last 72 hours.  Invalid input(s): APPERANCEUR    Imaging: No results found.   Medications:    . cyclophosphamide  50 mg Oral Daily  . gabapentin  100 mg Oral QHS  . insulin aspart  0-15 Units Subcutaneous TID WC  . insulin aspart  0-5 Units Subcutaneous QHS  . insulin aspart  3 Units Subcutaneous TID WC  . levothyroxine  200 mcg Oral QAC breakfast  . pantoprazole  40 mg Oral BID  . polyethylene glycol  17 g Oral TID  . predniSONE  60 mg Oral Q breakfast  . sodium bicarbonate  650 mg Oral BID  . sulfamethoxazole-trimethoprim  1 tablet Oral Q M,[place],F-1800   acetaminophen **OR** acetaminophen, hydrALAZINE, ondansetron **OR** ondansetron (ZOFRAN) IV, traMADol, zolpidem  Assessment/ Plan:   AKI with MPO vasculitis  Prednisone and TPE , will restart cytoxan at 50mg  daily - noted a drop in wbc < 4000  Hopefully will not require long term dialysis will continue to follow and watch for recovery of renal function   Creatinine a little better   Thrombocytopenia will follow  Restarted cytoxan    LOS: 24 Nicholas Hudson @TODAY @9 :13 AM

## 2016-05-10 NOTE — Care Management (Addendum)
Case Management Note Initial Note Started By Jonnie Finner, RN,BSN  Patient Details  Name: CAVIN TRIPPE MRN: [id number] Date of Birth: [birth year]  Subjective/Objective:  Acute Glomerulonephritis, AKI, Sepsis                  Action/Plan: Discharge Planning: NCM spoke to pt and wife at bedside. Pt is requesting RW and 3n1 bedside commode for home. Will order on 05/04/2016 from [facility]. Pt was independent prior to hospital stay. Wife at home to assist with care. Will continue to follow for dc needs.   PCP POLITE, RONALD   Expected Discharge Date:  04/25/16                     Expected Discharge Plan:  Home/Self Care  In-House Referral:  NA  Discharge planning Services  NA  Post Acute Care Choice:  NA Choice offered to:  NA  DME Arranged:  3n1, RW DME Agency:  [place] Agency:  [name]   Status of Service: Completed.   If discussed at [place] of Stay Meetings, dates discussed:  05-10-16   Additional Comments: 05-13-16 [address], RN,BSN [phone] [facility] [address] No further needs from CM at this time. Prior authorization is needed for Cytoxan- [phone]. CM did call MD Carolin Sicks and stated that renal needs to call for prior authorization. Prior authorization to be called in from [place]. CM will make pt aware. No further needs from CM at this time.      05-12-16 [address] [place] [phone] CM did call [facility] and medication Cytoxan is available at the pharmacy. CM will make the patient aware of cost and location. No further needs at this time.   05-11-16 [address], [place] [phone] Benefits check completed and will make pt aware of cost.  S/W ABI @ [facility] # [phone]   1. CYCLOPHOSPHAMIDE ( CYTOXAN ) CAPSUL 50 MH DAILY   COVER- YES  CO-PAY- 50 % OF TOTAL COAST  TIER- 4 DRUG   [facility]    1031 05-11-16 [address] [place] [phone] CM did speak with pt in regards to disposition needs- DME RW and 3n1 ordered. [facility] to deliver DME to room before d/c.  CM did verify that pt has Rx coverage part D. CM has benefits check in process and will make pt aware of cost of Cytoxan once completed. No further needs at this time.    1603 05-10-16 Jacqlyn Krauss, RN,BSN [phone] Last Plasmapheresis on 05-09-16. Monitoring labs at this time. Hopefully stable for d/c on 05-11-16. Consult received for new medications. Benefits check in process for Cytoxan. CM will make pt aware of cost once completed.  No home needs identified at this time. CM will continue to follow until d/c.

## 2016-05-10 NOTE — Progress Notes (Signed)
Pt stated that he does not need any help with this CPAP machine for the night. Pt stated that he will wear machine once he is ready for bed. No distress noted at this time.

## 2016-05-10 NOTE — Progress Notes (Signed)
PROGRESS NOTE    Nicholas Hudson  [id number] DOB: [birth year] DOA: 04/16/2016 PCP: Kandice Hams, MD   Brief Narrative: 76 year old male with history of BPH, gout, recurrent sinus infection, hypothyroidism admitted with worsening renal failure. Patient was evaluated by nephrologist and underwent kidney biopsy. 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 Patient received 2 sessions of hemodialysis treatment. Now receiving plasma exchange to my steroid for ANCA glomerulonephritis. Patient also with upper GI bleeding for which patient was evaluated by gastroenterologist.  Assessment & Plan:  # Acute kidney injury due to MPO ANCA vasculitis with RPGN: -Status post kidney biopsy on 04/20/16  with pauci immune GN, 40% crescents, ATN, minimal scarring -S/p pulse solumedrol, now on po prednisone -resumed cytoxan 50 mg by nephrologist on 2/26.  -Completed plasma exchange on 05/09/16 as per nephrologist -Patient is non-oliguric and serum creatinine level is trending down tp 2.2. Continue to monitor BMP and urine output. -Follow up with nephrologist for further plan and for discharge planning.  #Upper GI bleed due to duodenal ulcer status post clipping and the patient is currently on PPI. -s/p EGD 05/07/16 which showed 57mm duodenal ulcer with exposed vessel s/p Argon laser cauterization by GI -Patient had black colored stool today. I examined with the patient. Follow-up GIs for further recommendation. Repeat CBC in the morning.  #Acute blood loss anemia: Anemia multifactorial including In the setting of GI bleed, due to renal failure and possibly in the setting of Cytoxan. -Repeat hemoglobin was acceptable in a straight therefore he did not receive blood transfusion. Hemoglobin level of 7.9 today. Continue to monitor hemoglobin and platelet counts. -Patient also with thrombocytopenia. Platelet level are improving to 143.   #Sepsis of unclear source on admission, resolved  now.  #Metabolic encephalopathy, acute: MRI showed no acute finding. Mental status improved.  #Hypothyroidism: Continue Synthroid  #Hyperglycemia likely due to steroid: Continue to monitor blood sugar level. On sliding scale.  Principal Problem:   Acute glomerulonephritis Active Problems:   AKI (acute kidney injury) ([place])   Abnormal transaminases   Frequent headaches   Hypothyroidism   Hyponatremia   Sepsis ([place])   Metabolic encephalopathy   ANCA-associated vasculitis (HCC)   Acute renal failure (HCC)   Acute blood loss anemia  DVT prophylaxis: SCDs . The patient has severe anemia and thrombocytopenia Code Status: Full code Family Communication: No family present at bedside.  Disposition Plan: Likely discharge home in 1-2 days when ok from NEPHROLOGIST and GI. Patient is ambulating in the room therefore do not think he needs home care services.    Consultants:   Nephrologist  GI  General surgery  Hematology oncology  Procedures: Kidney biopsy on February 7 Placement of hemodialysis catheter on February 9 Echocardiogram Plasma exchange started on February 12  Antimicrobials: Initially started on vancomycin and Zosyn on admission and discontinued later.   Subjective: Patient was seen and examined at bedside. Still having black colored stools. Denied nausea vomiting chest pain, shortness of breath.  Objective: Vitals:   05/09/16 2118 05/09/16 2314 05/10/16 0415 05/10/16 0945  BP: (!) 161/74 (!) 154/69 (!) 143/94 (!) 148/73  Pulse: (!) 58  63 80  Resp:      Temp: 98.6 F (37 C)  98.9 F (37.2 C) 97.4 F (36.3 C)  TempSrc: Oral  Oral Oral  SpO2: 100%  99% 100%  Weight:   81.8 kg (180 lb 6.4 oz)   Height:        Intake/Output Summary (Last 24 hours)  at 05/10/16 1135 Last data filed at 05/10/16 1002  Gross per 24 hour  Intake              960 ml  Output             3100 ml  Net            -2140 ml   Filed Weights   05/08/16 0543 05/09/16 0447  05/10/16 0415  Weight: 83.4 kg (183 lb 12.8 oz) 82.6 kg (182 lb 1.6 oz) 81.8 kg (180 lb 6.4 oz)    Examination:  General exam: Pleasant male, looks comfortable, ambulating in the room. Respiratory system: Clear bilateral, respiratory effort normal Cardiovascular system: S1 & S2 heard, RRR.   Gastrointestinal system: Abdomen soft, nontender, nondistended. Bowel sounds positive Central nervous system: Alert and oriented. No focal neurological deficits. Extremities: Symmetric 5 x 5 power.bilateral lower extremities pitting edema no significant change Skin: No rashes, lesions or ulcers Psychiatry: Judgement and insight appear normal. Mood & affect appropriate.     Data Reviewed: I have personally reviewed following labs and imaging studies  CBC:  Recent Labs Lab 05/06/16 0351 05/07/16 0435 05/08/16 0506 05/09/16 0419 05/09/16 0858 05/09/16 1446 05/10/16 0502  WBC 5.0 5.8 4.7 3.7*  --   --  5.4  HGB 6.3* 8.6* 7.8* 7.2* 6.8* 8.3* 7.9*  HCT 19.0* 25.7* 23.7* 21.5* 20.0* 25.1* 23.9*  MCV 86.8 87.7 88.1 88.1  --   --  88.8  PLT 91* 120* 98* 107*  --   --  [id number]*   Basic Metabolic Panel:  Recent Labs Lab 05/05/16 0341 05/06/16 0351 05/07/16 0435 05/08/16 0506 05/09/16 0419 05/09/16 0858 05/10/16 0502  NA 141 141 143 146* 142 145 143  K 4.2 4.1 5.0 3.8 3.9 3.4* 3.7  CL 113* 112* 112* 113* 112* 104 110  CO2 22 23 21* 24 23  --  25  GLUCOSE 106* 146* 96 94 108* 188* 102*  BUN 53* 43* 44* 34* 37* 36* 37*  CREATININE 2.89* 2.74* 2.66* 2.47* 2.37* 2.40* 2.24*  CALCIUM 8.7* 8.8* 9.1 8.9 8.7*  --  8.9  PHOS 4.0  --  3.9  --  2.9  --  3.2   GFR: Estimated Creatinine Clearance: 27.2 mL/min (by C-G formula based on SCr of 2.24 mg/dL (H)). Liver Function Tests:  Recent Labs Lab 05/04/16 0548 05/05/16 0341 05/07/16 0435 05/09/16 0419 05/10/16 0502  AST 13*  --   --   --   --   ALT 15*  --   --   --   --   ALKPHOS 34*  --   --   --   --   BILITOT 0.8  --   --   --   --    PROT 5.1*  --   --   --   --   ALBUMIN 4.0 4.0 4.5 4.1 4.5   No results for input(s): LIPASE, AMYLASE in the last 168 hours. No results for input(s): AMMONIA in the last 168 hours. Coagulation Profile: No results for input(s): INR, PROTIME in the last 168 hours. Cardiac Enzymes: No results for input(s): CKTOTAL, CKMB, CKMBINDEX, TROPONINI in the last 168 hours. BNP (last 3 results) No results for input(s): PROBNP in the last 8760 hours. HbA1C: No results for input(s): HGBA1C in the last 72 hours. CBG:  Recent Labs Lab 05/09/16 1311 05/09/16 1613 05/09/16 2115 05/10/16 0724 05/10/16 1116  GLUCAP 140* 201* 122* 100* 148*   Lipid Profile:  No results for input(s): CHOL, HDL, LDLCALC, TRIG, CHOLHDL, LDLDIRECT in the last 72 hours. Thyroid Function Tests: No results for input(s): TSH, T4TOTAL, FREET4, T3FREE, THYROIDAB in the last 72 hours. Anemia Panel: No results for input(s): VITAMINB12, FOLATE, FERRITIN, TIBC, IRON, RETICCTPCT in the last 72 hours. Sepsis Labs: No results for input(s): PROCALCITON, LATICACIDVEN in the last 168 hours.  No results found for this or any previous visit (from the past 240 hour(s)).       Radiology Studies: No results found.      Scheduled Meds: . cyclophosphamide  50 mg Oral Daily  . gabapentin  100 mg Oral QHS  . insulin aspart  0-15 Units Subcutaneous TID WC  . insulin aspart  0-5 Units Subcutaneous QHS  . insulin aspart  3 Units Subcutaneous TID WC  . levothyroxine  200 mcg Oral QAC breakfast  . pantoprazole  40 mg Oral BID  . polyethylene glycol  17 g Oral TID  . predniSONE  60 mg Oral Q breakfast  . sodium bicarbonate  650 mg Oral BID  . sulfamethoxazole-trimethoprim  1 tablet Oral Q M,W,F-1800   Continuous Infusions:   LOS: 24 days    Shrita Thien Tanna Furry, MD [facility] Pager [phone]  If 7PM-7AM, please contact night-coverage [email or web address] Password [facility] 05/10/2016, 11:35 AM

## 2016-05-11 ENCOUNTER — Encounter (HOSPITAL_COMMUNITY): Payer: Self-pay | Admitting: Diagnostic Radiology

## 2016-05-11 ENCOUNTER — Inpatient Hospital Stay (HOSPITAL_COMMUNITY): Payer: Medicare Other

## 2016-05-11 HISTORY — PX: IR GENERIC HISTORICAL: IMG1180011

## 2016-05-11 LAB — RENAL FUNCTION PANEL
Albumin: 4.3 g/dL (ref 3.5–5.0)
Anion gap: 9 (ref 5–15)
BUN: 41 mg/dL — AB (ref 6–20)
CHLORIDE: 110 mmol/L (ref 101–111)
CO2: 23 mmol/L (ref 22–32)
Calcium: 8.8 mg/dL — ABNORMAL LOW (ref 8.9–10.3)
Creatinine, Ser: 2.28 mg/dL — ABNORMAL HIGH (ref 0.61–1.24)
GFR calc Af Amer: 30 mL/min — ABNORMAL LOW (ref >60)
GFR, EST NON AFRICAN AMERICAN: 26 mL/min — AB (ref >60)
Glucose, Bld: 114 mg/dL — ABNORMAL HIGH (ref 65–99)
Phosphorus: 3.5 mg/dL (ref 2.5–4.6)
Potassium: 4.2 mmol/L (ref 3.5–5.1)
Sodium: 142 mmol/L (ref 135–145)

## 2016-05-11 LAB — CBC
HEMATOCRIT: 22.4 % — AB (ref 39.0–52.0)
Hemoglobin: 7.4 g/dL — ABNORMAL LOW (ref 13.0–17.0)
MCH: 29.2 pg (ref 26.0–34.0)
MCHC: 33 g/dL (ref 30.0–36.0)
MCV: 88.5 fL (ref 78.0–100.0)
Platelets: 145 10*3/uL — ABNORMAL LOW (ref 150–400)
RBC: 2.53 MIL/uL — ABNORMAL LOW (ref 4.22–5.81)
RDW: 17 % — AB (ref 11.5–15.5)
WBC: 5.3 10*3/uL (ref 4.0–10.5)

## 2016-05-11 LAB — GLUCOSE, CAPILLARY
GLUCOSE-CAPILLARY: 102 mg/dL — AB (ref 65–99)
GLUCOSE-CAPILLARY: 154 mg/dL — AB (ref 65–99)
Glucose-Capillary: 151 mg/dL — ABNORMAL HIGH (ref 65–99)
Glucose-Capillary: 107 mg/dL — ABNORMAL HIGH (ref 65–99)

## 2016-05-11 MED ORDER — LIDOCAINE HCL (PF) 1 % IJ SOLN
INTRAMUSCULAR | Status: AC
  Filled 2016-05-11: qty 30

## 2016-05-11 MED ORDER — SODIUM CHLORIDE 0.9 % IV SOLN
INTRAVENOUS | Status: DC
  Administered 2016-05-11: 14:00:00 via INTRAVENOUS

## 2016-05-11 NOTE — Progress Notes (Signed)
Patient's case discussed with hospital team and I will be by later to see the patient will proceed tomorrow at 11:30 AM with repeat endoscopy just to be sure and if no signs of bleeding might even consider colonoscopy on Friday and call sooner if any question or problem

## 2016-05-11 NOTE — Progress Notes (Signed)
Pt previously reported to me that he does not need any help with this CPAP machine for the night. Pt stated that he will wear machine once he is ready for bed.

## 2016-05-11 NOTE — Progress Notes (Signed)
Nicholas Hudson Monday 6:17 PM  Subjective: Patient without any new complaints and he says he has very dark brown stools but not black may be even greenish according to his daughter and no new complaints  Objective: Vital signs stable afebrile no acute distress abdomen is soft nontender hemoglobin dropped a little BUN and creatinine stable  Assessment: Questionable residual GI blood loss  Plan: Will proceed with endoscopy tomorrow and pending those findings consider colonoscopy on Friday and patient agrees with the plan as does his family  [facility]  Pager [phone] After 5PM or if no answer call [phone]

## 2016-05-11 NOTE — Progress Notes (Signed)
Successful removal of temporary dialysis catheter.  No complications.  Patient tolerated procedure well.   Brynda Greathouse, MS RD PA-C 2:55 PM

## 2016-05-11 NOTE — Progress Notes (Signed)
[facility] ROUNDING NOTE   Subjective:   Interval History:AKI with MPO ANCA vasculitis and RPGN . Fairly favorable renal biopsy - continue Prednisone and restarted cytoxan . Developed acute GI bleed secondary to duodenal ulcer treated with argon laser cauterization  Objective:  Vital signs in last 24 hours:  Temp:  [98.1 F (36.7 C)-98.2 F (36.8 C)] 98.1 F (36.7 C) (02/28 0407) Pulse Rate:  [63] 63 (02/28 0407) Resp:  [16] 16 (02/28 0407) BP: (133-154)/(64-84) 133/84 (02/28 0407) SpO2:  [99 %-100 %] 99 % (02/28 0407) Weight:  [80.1 kg (176 lb 9.6 oz)] 80.1 kg (176 lb 9.6 oz) (02/28 0407)  Weight change: -1.724 kg (-3 lb 12.8 oz) Filed Weights   05/09/16 0447 05/10/16 0415 05/11/16 0407  Weight: 82.6 kg (182 lb 1.6 oz) 81.8 kg (180 lb 6.4 oz) 80.1 kg (176 lb 9.6 oz)    Intake/Output: I/O last 3 completed shifts: In: 960 [P.O.:960] Out: 5200 [Urine:5200]   Intake/Output this shift:  Total I/O In: 360 [P.O.:360] Out: -   CVS- RRR RS- CTA ABD- BS present soft non-distended EXT-  Trace  edema   Basic Metabolic Panel:  Recent Labs Lab 05/05/16 0341  05/07/16 0435 05/08/16 0506 05/09/16 0419 05/09/16 0858 05/10/16 0502 05/11/16 0332  NA 141  < > 143 146* 142 145 143 142  K 4.2  < > 5.0 3.8 3.9 3.4* 3.7 4.2  CL 113*  < > 112* 113* 112* 104 110 110  CO2 22  < > 21* 24 23  --  25 23  GLUCOSE 106*  < > 96 94 108* 188* 102* 114*  BUN 53*  < > 44* 34* 37* 36* 37* 41*  CREATININE 2.89*  < > 2.66* 2.47* 2.37* 2.40* 2.24* 2.28*  CALCIUM 8.7*  < > 9.1 8.9 8.7*  --  8.9 8.8*  PHOS 4.0  --  3.9  --  2.9  --  3.2 3.5  < > = values in this interval not displayed.  Liver Function Tests:  Recent Labs Lab 05/05/16 0341 05/07/16 0435 05/09/16 0419 05/10/16 0502 05/11/16 0332  ALBUMIN 4.0 4.5 4.1 4.5 4.3   No results for input(s): LIPASE, AMYLASE in the last 168 hours. No results for input(s): AMMONIA in the last 168 hours.  CBC:  Recent  Labs Lab 05/07/16 0435 05/08/16 0506 05/09/16 0419 05/09/16 0858 05/09/16 1446 05/10/16 0502 05/11/16 0332  WBC 5.8 4.7 3.7*  --   --  5.4 5.3  HGB 8.6* 7.8* 7.2* 6.8* 8.3* 7.9* 7.4*  HCT 25.7* 23.7* 21.5* 20.0* 25.1* 23.9* 22.4*  MCV 87.7 88.1 88.1  --   --  88.8 88.5  PLT 120* 98* 107*  --   --  143* 145*    Cardiac Enzymes: No results for input(s): CKTOTAL, CKMB, CKMBINDEX, TROPONINI in the last 168 hours.  BNP: Invalid input(s): POCBNP  CBG:  Recent Labs Lab 05/10/16 1116 05/10/16 1621 05/10/16 2055 05/11/16 0733 05/11/16 1110  GLUCAP 148* 136* 156* 102* 151*    Microbiology: Results for orders placed or performed during the hospital encounter of 04/16/16  Culture, blood (Routine x 2)     Status: None   Collection Time: 04/16/16  3:32 PM  Result Value Ref Range Status   Specimen Description BLOOD RIGHT WRIST  Final   Special Requests BOTTLES DRAWN AEROBIC AND ANAEROBIC 5 CC EACH  Final   Culture   Final    NO GROWTH 5 DAYS Performed at [facility],  [address] [place], [place] [address]    Report Status 04/21/2016 FINAL  Final  Culture, blood (Routine x 2)     Status: None   Collection Time: 04/16/16  4:15 PM  Result Value Ref Range Status   Specimen Description BLOOD RIGHT HAND  Final   Special Requests BOTTLES DRAWN AEROBIC ONLY 5 CC  Final   Culture   Final    NO GROWTH 5 DAYS Performed at [facility], [place] [address] [place], [place] [address]    Report Status 04/21/2016 FINAL  Final  Urine culture     Status: None   Collection Time: 04/16/16  4:23 PM  Result Value Ref Range Status   Specimen Description URINE, RANDOM  Final   Special Requests NONE  Final   Culture   Final    NO GROWTH Performed at [facility], [place] [address], [place], [place] [address]    Report Status 04/18/2016 FINAL  Final  Culture, blood (x 2)     Status: None   Collection Time: 04/17/16  8:19 AM  Result Value Ref Range Status   Specimen  Description BLOOD LEFT ANTECUBITAL  Final   Special Requests BOTTLES DRAWN AEROBIC AND ANAEROBIC 5CC  Final   Culture   Final    NO GROWTH 5 DAYS Performed at [facility], [place] [address], [place], [place] [address]    Report Status 04/22/2016 FINAL  Final  Culture, blood (x 2)     Status: None   Collection Time: 04/17/16  8:27 AM  Result Value Ref Range Status   Specimen Description BLOOD RIGHT HAND  Final   Special Requests BOTTLES DRAWN AEROBIC AND ANAEROBIC 5CC  Final   Culture   Final    NO GROWTH 5 DAYS Performed at [facility], [place] [address] [place], [place] [address]    Report Status 04/22/2016 FINAL  Final  MRSA PCR Screening     Status: None   Collection Time: 04/26/16  2:02 PM  Result Value Ref Range Status   MRSA by PCR NEGATIVE NEGATIVE Final    Comment:        The GeneXpert MRSA Assay (FDA approved for NASAL specimens only), is one component of a comprehensive MRSA colonization surveillance program. It is not intended to diagnose MRSA infection nor to guide or monitor treatment for MRSA infections.   MRSA PCR Screening     Status: None   Collection Time: 04/29/16  4:42 PM  Result Value Ref Range Status   MRSA by PCR NEGATIVE NEGATIVE Final    Comment:        The GeneXpert MRSA Assay (FDA approved for NASAL specimens only), is one component of a comprehensive MRSA colonization surveillance program. It is not intended to diagnose MRSA infection nor to guide or monitor treatment for MRSA infections.     Coagulation Studies: No results for input(s): LABPROT, INR in the last 72 hours.  Urinalysis: No results for input(s): COLORURINE, LABSPEC, PHURINE, GLUCOSEU, HGBUR, BILIRUBINUR, KETONESUR, PROTEINUR, UROBILINOGEN, NITRITE, LEUKOCYTESUR in the last 72 hours.  Invalid input(s): APPERANCEUR    Imaging: No results found.   Medications:    . cyclophosphamide  50 mg Oral Daily  . gabapentin  100 mg Oral QHS  . insulin aspart  0-15  Units Subcutaneous TID WC  . insulin aspart  0-5 Units Subcutaneous QHS  . insulin aspart  3 Units Subcutaneous TID WC  . levothyroxine  200 mcg  Oral QAC breakfast  . pantoprazole  40 mg Oral BID  . polyethylene glycol  17 g Oral TID  . predniSONE  60 mg Oral Q breakfast  . sodium bicarbonate  650 mg Oral BID  . sulfamethoxazole-trimethoprim  1 tablet Oral Q M,W,F-1800   acetaminophen **OR** acetaminophen, hydrALAZINE, ondansetron **OR** ondansetron (ZOFRAN) IV, traMADol, zolpidem  Assessment/ Plan:   AKI with MPO vasculitis Prednisone and TPE , will restart cytoxan at 50mg  daily - noted a drop in wbc <4000  Hopefully will not require long term dialysis will continue to follow and watch for recovery of renal function -- may d/c dialysis catheter   Creatinine  Stable   Thrombocytopenia will follow  Restarted cytoxan   LOS: 25 [place] W @TODAY @11 :19 AM

## 2016-05-11 NOTE — Progress Notes (Addendum)
PROGRESS NOTE    Nicholas Hudson  [id number] DOB: 1941/02/29 DOA: 04/16/2016 PCP: Kandice Hams, MD   Brief Narrative: 76 year old male with history of BPH, gout, recurrent sinus infection, hypothyroidism admitted with worsening renal failure. Patient was evaluated by nephrologist and underwent kidney biopsy. 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 Patient received 2 sessions of hemodialysis treatment. Now receiving plasma exchange to my steroid for ANCA glomerulonephritis. Patient also with upper GI bleeding for which patient was evaluated by gastroenterologist.  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   -Completed plasma exchange on 05/09/16 as per nephrologist -Patient has decreased urine output and serum creatinine level is stable at 2.2.  -I called and discussed with the nephrologist Dr.Webb regarding the further plan. He agreed to discontinue the tunnel catheter. I ordered IR to remove the tunneled catheter. I also discussed with him regarding discharge planning and medications according discharge Cytoxan and prednisone etc. -Monitor CBC and BMP closely.  #Upper GI bleed due to duodenal ulcer status post clipping and the patient is currently on PPI. -s/p EGD 05/07/16 which showed 90mm duodenal ulcer with exposed vessel s/p Argon laser cauterization by GI -Patient showed very black colored stool today. His hemoglobin is dropping to 7.4. As per GI note from yesterday okay to discharge. I paged GI Dr Watt Climes and discussed with him about it. The patient will be reevaluated today.  #Acute blood loss anemia: Anemia multifactorial including In the setting of GI bleed, due to renal failure and possibly in the setting of Cytoxan. - Hemoglobin is dropping to 7.4  today. GI evaluation ongoing. Continue to monitor. Repeat CBC in the morning. -Patient also with thrombocytopenia. Platelet 145 today.   #Sepsis of unclear source on admission, resolved now.  #Metabolic encephalopathy, acute: MRI showed no acute finding. Mental status improved.  #Hypothyroidism: Continue Synthroid  #Hyperglycemia likely due to steroid: Continue to monitor blood sugar level. On sliding scale.  Principal Problem:   Acute glomerulonephritis Active Problems:   AKI (acute kidney injury) ([place])   Abnormal transaminases   Frequent headaches   Hypothyroidism   Hyponatremia   Sepsis ([place])   Metabolic encephalopathy   ANCA-associated vasculitis (H[facility])   Acute renal failure ([facility])   Acute blood loss anemia  DVT prophylaxis: SCDs . The patient has severe anemia and thrombocytopenia Code Status: Full code Family Communication: No family present at bedside.  Disposition Plan: Likely discharge tomorrow when okay from nephrology and GI.  Ordered rolling walker and home care service to plan discharge. Discussed with the case manager.  Consultants:   Nephrologist  GI  General surgery  Hematology oncology  Procedures: Kidney biopsy on February 7 Placement of hemodialysis catheter on February 9 Echocardiogram Plasma exchange started on February 12  Antimicrobials: Initially started on vancomycin and Zosyn on admission and discontinued later.   Subjective: Patient was seen and examined at bedside.  patient is still showing black colored stool. Denied nausea vomiting chest pain or shortness of breath. No headache or dizziness. Objective: Vitals:   05/10/16 0945 05/10/16 1658 05/10/16 2052 05/11/16 0407  BP: (!) 148/73 (!) 154/73 (!) 146/64 133/84  Pulse: 80  63 63  Resp:    16  Temp: 97.4 F (36.3 C) 98.2 F (36.8 C) 98.2 F (36.8  C) 98.1 F (36.7 C)  TempSrc: Oral Oral Oral Oral  SpO2: 100% 100% 100% 99%  Weight:    80.1 kg (176 lb 9.6 oz)  Height:    5\' 5"   (1.651 m)    Intake/Output Summary (Last 24 hours) at 05/11/16 1140 Last data filed at 05/11/16 0830  Gross per 24 hour  Intake              840 ml  Output             3650 ml  Net            -2810 ml   Filed Weights   05/09/16 0447 05/10/16 0415 05/11/16 0407  Weight: 82.6 kg (182 lb 1.6 oz) 81.8 kg (180 lb 6.4 oz) 80.1 kg (176 lb 9.6 oz)    Examination:  General exam: Pleasant male looking comfortable, and bleeding in the room. Respiratory system:  clear bilateral, respiratory effort normal Cardiovascular system: S1 & S2 heard, RRR.   Gastrointestinal system:  abdomen soft, nontender, nondistended. Bowel sound positive. Central nervous system: Alert and oriented. No focal neurological deficits. Extremities: Symmetric 5 x 5 power. Edema improved Skin: No rashes, lesions or ulcers Psychiatry: Judgement and insight appear normal. Mood & affect appropriate.     Data Reviewed: I have personally reviewed following labs and imaging studies  CBC:  Recent Labs Lab 05/07/16 0435 05/08/16 0506 05/09/16 0419 05/09/16 0858 05/09/16 1446 05/10/16 0502 05/11/16 0332  WBC 5.8 4.7 3.7*  --   --  5.4 5.3  HGB 8.6* 7.8* 7.2* 6.8* 8.3* 7.9* 7.4*  HCT 25.7* 23.7* 21.5* 20.0* 25.1* 23.9* 22.4*  MCV 87.7 88.1 88.1  --   --  88.8 88.5  PLT 120* 98* 107*  --   --  143* [id number]*   Basic Metabolic Panel:  Recent Labs Lab 05/05/16 0341  05/07/16 0435 05/08/16 0506 05/09/16 0419 05/09/16 0858 05/10/16 0502 05/11/16 0332  NA 141  < > 143 146* 142 145 143 142  K 4.2  < > 5.0 3.8 3.9 3.4* 3.7 4.2  CL 113*  < > 112* 113* 112* 104 110 110  CO2 22  < > 21* 24 23  --  25 23  GLUCOSE 106*  < > 96 94 108* 188* 102* 114*  BUN 53*  < > 44* 34* 37* 36* 37* 41*  CREATININE 2.89*  < > 2.66* 2.47* 2.37* 2.40* 2.24* 2.28*  CALCIUM 8.7*  < > 9.1 8.9 8.7*  --  8.9 8.8*  PHOS 4.0  --  3.9  --  2.9  --  3.2 3.5  < > = values in this interval not displayed. GFR: Estimated Creatinine Clearance: 26.4  mL/min (by C-G formula based on SCr of 2.28 mg/dL (H)). Liver Function Tests:  Recent Labs Lab 05/05/16 0341 05/07/16 0435 05/09/16 0419 05/10/16 0502 05/11/16 0332  ALBUMIN 4.0 4.5 4.1 4.5 4.3   No results for input(s): LIPASE, AMYLASE in the last 168 hours. No results for input(s): AMMONIA in the last 168 hours. Coagulation Profile: No results for input(s): INR, PROTIME in the last 168 hours. Cardiac Enzymes: No results for input(s): CKTOTAL, CKMB, CKMBINDEX, TROPONINI in the last 168 hours. BNP (last 3 results) No results for input(s): PROBNP in the last 8760 hours. HbA1C: No results for input(s): HGBA1C in the last 72 hours. CBG:  Recent Labs Lab 05/10/16 1116 05/10/16 1621 05/10/16 2055 05/11/16 0733 05/11/16 1110  GLUCAP 148* 136* 156* 102* 151*  Lipid Profile: No results for input(s): CHOL, HDL, LDLCALC, TRIG, CHOLHDL, LDLDIRECT in the last 72 hours. Thyroid Function Tests: No results for input(s): TSH, T4TOTAL, FREET4, T3FREE, THYROIDAB in the last 72 hours. Anemia Panel: No results for input(s): VITAMINB12, FOLATE, FERRITIN, TIBC, IRON, RETICCTPCT in the last 72 hours. Sepsis Labs: No results for input(s): PROCALCITON, LATICACIDVEN in the last 168 hours.  No results found for this or any previous visit (from the past 240 hour(s)).       Radiology Studies: No results found.      Scheduled Meds: . cyclophosphamide  50 mg Oral Daily  . gabapentin  100 mg Oral QHS  . insulin aspart  0-15 Units Subcutaneous TID WC  . insulin aspart  0-5 Units Subcutaneous QHS  . insulin aspart  3 Units Subcutaneous TID WC  . levothyroxine  200 mcg Oral QAC breakfast  . pantoprazole  40 mg Oral BID  . polyethylene glycol  17 g Oral TID  . predniSONE  60 mg Oral Q breakfast  . sodium bicarbonate  650 mg Oral BID  . sulfamethoxazole-trimethoprim  1 tablet Oral Q M,W,F-1800   Continuous Infusions:   LOS: 25 days    Dron Tanna Furry, MD [facility] Pager [phone]  If 7PM-7AM, please contact night-coverage [email or web address] Password [email or web address] 05/11/2016, 11:40 AM

## 2016-05-12 ENCOUNTER — Inpatient Hospital Stay (HOSPITAL_COMMUNITY): Payer: Medicare Other | Admitting: Certified Registered Nurse Anesthetist

## 2016-05-12 ENCOUNTER — Encounter (HOSPITAL_COMMUNITY): Payer: Self-pay | Admitting: *Deleted

## 2016-05-12 ENCOUNTER — Encounter (HOSPITAL_COMMUNITY)
Admission: EM | Disposition: A | Discharge status: Home / Self Care | Payer: Self-pay | Source: Home / Self Care | Attending: Internal Medicine

## 2016-05-12 HISTORY — PX: ESOPHAGOGASTRODUODENOSCOPY (EGD) WITH PROPOFOL: SHX5813

## 2016-05-12 LAB — RENAL FUNCTION PANEL
Albumin: 4.4 g/dL (ref 3.5–5.0)
Anion gap: 7 (ref 5–15)
BUN: 43 mg/dL — ABNORMAL HIGH (ref 6–20)
CHLORIDE: 113 mmol/L — AB (ref 101–111)
CO2: 24 mmol/L (ref 22–32)
CREATININE: 2.28 mg/dL — AB (ref 0.61–1.24)
Calcium: 8.9 mg/dL (ref 8.9–10.3)
GFR calc non Af Amer: 26 mL/min — ABNORMAL LOW (ref >60)
GFR, EST AFRICAN AMERICAN: 30 mL/min — AB (ref >60)
GLUCOSE: 84 mg/dL (ref 65–99)
Phosphorus: 3.8 mg/dL (ref 2.5–4.6)
Potassium: 5 mmol/L (ref 3.5–5.1)
Sodium: 144 mmol/L (ref 135–145)

## 2016-05-12 LAB — GLUCOSE, CAPILLARY
GLUCOSE-CAPILLARY: 77 mg/dL (ref 65–99)
GLUCOSE-CAPILLARY: 135 mg/dL — AB (ref 65–99)
Glucose-Capillary: 94 mg/dL (ref 65–99)
Glucose-Capillary: 125 mg/dL — ABNORMAL HIGH (ref 65–99)

## 2016-05-12 LAB — CBC
HCT: 22.2 % — ABNORMAL LOW (ref 39.0–52.0)
Hemoglobin: 7.3 g/dL — ABNORMAL LOW (ref 13.0–17.0)
MCH: 29.2 pg (ref 26.0–34.0)
MCHC: 32.9 g/dL (ref 30.0–36.0)
MCV: 88.8 fL (ref 78.0–100.0)
PLATELETS: 155 10*3/uL (ref 150–400)
RBC: 2.5 MIL/uL — AB (ref 4.22–5.81)
RDW: 16.9 % — ABNORMAL HIGH (ref 11.5–15.5)
WBC: 4.9 10*3/uL (ref 4.0–10.5)

## 2016-05-12 SURGERY — ESOPHAGOGASTRODUODENOSCOPY (EGD) WITH PROPOFOL
Anesthesia: Monitor Anesthesia Care

## 2016-05-12 MED ORDER — PROPOFOL 10 MG/ML IV BOLUS
INTRAVENOUS | Status: DC | PRN
  Administered 2016-05-12: 50 mg via INTRAVENOUS

## 2016-05-12 MED ORDER — PROPOFOL 500 MG/50ML IV EMUL
INTRAVENOUS | Status: DC | PRN
  Administered 2016-05-12: 100 ug/kg/min via INTRAVENOUS

## 2016-05-12 MED ORDER — PEG 3350-KCL-NA BICARB-NACL 420 G PO SOLR
4000.0000 mL | Freq: Once | ORAL | Status: AC
  Administered 2016-05-12: 4000 mL via ORAL
  Filled 2016-05-12: qty 4000

## 2016-05-12 MED ORDER — SODIUM CHLORIDE 0.9 % IV SOLN
INTRAVENOUS | Status: DC
  Administered 2016-05-13: 09:00:00 via INTRAVENOUS

## 2016-05-12 MED ORDER — LIDOCAINE 2% (20 MG/ML) 5 ML SYRINGE
INTRAMUSCULAR | Status: DC | PRN
  Administered 2016-05-12: 80 mg via INTRAVENOUS

## 2016-05-12 NOTE — Anesthesia Procedure Notes (Signed)
Performed by: Patina Spanier SALOMON       

## 2016-05-12 NOTE — Op Note (Signed)
[facility] Patient Name: Nicholas Hudson Procedure Date : 05/12/2016 MRN: [id number] Attending MD: Clarene Essex , MD Date of Birth: September 15, 1940 CSN: [id number] Age: 76 Admit Type: Inpatient Procedure:                Upper GI endoscopy Indications:              Iron deficiency anemia secondary to chronic blood                            loss, Heme positive stool concerns over continual                            bleeding Providers:                Clarene Essex, MD, Vista Lawman, RN, Elspeth Cho                            Tech., Technician, Cathe Mons, CRNA Referring MD:              Medicines:                Propofol total dose 265 mg IV 80 mg IV lidocaine Complications:            No immediate complications. Estimated Blood Loss:     Estimated blood loss: none. Procedure:                Pre-Anesthesia Assessment:                           - Prior to the procedure, a History and Physical                            was performed, and patient medications and                            allergies were reviewed. The patient's tolerance of                            previous anesthesia was also reviewed. The risks                            and benefits of the procedure and the sedation                            options and risks were discussed with the patient.                            All questions were answered, and informed consent                            was obtained. Prior Anticoagulants: The patient has                            taken no previous anticoagulant or antiplatelet  agents. ASA Grade Assessment: II - A patient with                            mild systemic disease. After reviewing the risks                            and benefits, the patient was deemed in                            satisfactory condition to undergo the procedure.                           After obtaining informed consent, the endoscope was      passed under direct vision. Throughout the                            procedure, the patient's blood pressure, pulse, and                            oxygen saturations were monitored continuously. The                            EG-[id number] ([id number]) scope was introduced through the                            mouth, and advanced to the second part of duodenum.                            The upper GI endoscopy was accomplished without                            difficulty. The patient tolerated the procedure                            well. Scope In: Scope Out: Findings:      The larynx was normal.      The examined esophagus was normal. except questionable tiny hiatal hernia      The entire examined stomach was normal.      One small non-bleeding cratered duodenal ulcer with no stigmata of       bleeding was found in the duodenal bulb.      One small clot along the edge of a cratered duodenal ulcer with adherent       clot was found in the second portion of the duodenum next to      1 previously placed clip. Fulguration to ablate the lesion to prevent       bleeding by heater probe -go probe 7 mm was successful.      The exam was otherwise without abnormality. Impression:               - Normal larynx.                           - Normal esophagus. except questionable tiny hiatal  hernia                           - Normal stomach.                           - One small non-bleeding duodenal ulcer with no                            stigmata of bleeding.                           - One small clot along the edge of a duodenal ulcer                            with adherent clot. Treated with a heater probe.                           - The examination was otherwise normal.                           - No specimens collected. Moderate Sedation:      moderate sedation-none Recommendation:           - The patient will be observed post-procedure,                             until all discharge criteria are met.                           - {skip}depending on colonoscopy or not.                           - Continue present medications.                           - Return to GI clinic in 1 week.                           - Telephone GI clinic if symptomatic PRN. Procedure Code(s):        --- Professional ---                           [phone], Esophagogastroduodenoscopy, flexible,                            transoral; with control of bleeding, any method Diagnosis Code(s):        --- Professional ---                           K26.9, Duodenal ulcer, unspecified as acute or                            chronic, without hemorrhage or perforation                           K26.4, Chronic or unspecified  duodenal ulcer with                            hemorrhage                           D50.0, Iron deficiency anemia secondary to blood                            loss (chronic)                           R19.5, Other fecal abnormalities CPT copyright 2016 [employer]. All rights reserved. The codes documented in this report are preliminary and upon coder review may  be revised to meet current compliance requirements. Clarene Essex, MD 05/12/2016 12:24:23 PM This report has been signed electronically. Number of Addenda: 0

## 2016-05-12 NOTE — Progress Notes (Signed)
Nicholas Hudson 11:46 AM  Subjective: Patient without any new complaints still with dark stools  Objective: Vital signs stable afebrile no acute distress exam please see preassessment evaluation labs stable  Assessment: Questionable persistent GI blood loss  Plan: Okay to proceed with endoscopy with anesthesia assistance and case discussed with family again  [facility]  Pager [phone] After 5PM or if no answer call [phone]

## 2016-05-12 NOTE — Anesthesia Postprocedure Evaluation (Addendum)
Anesthesia Post Note  Patient: Nicholas Hudson  Procedure(s) Performed: Procedure(s) (LRB): ESOPHAGOGASTRODUODENOSCOPY (EGD) WITH PROPOFOL ([name])  Patient location during evaluation: Endoscopy Anesthesia Type: MAC Level of consciousness: awake and alert Pain management: pain level controlled Vital Signs Assessment: post-procedure vital signs reviewed and stable Respiratory status: spontaneous breathing, nonlabored ventilation, respiratory function stable and patient connected to nasal cannula oxygen Cardiovascular status: stable and blood pressure returned to baseline Anesthetic complications: no       Last Vitals:  Vitals:   05/12/16 1220 05/12/16 1230  BP: (!) 150/59 (!) 154/56  Pulse: 62 (!) 59  Resp: 17 12  Temp:      Last Pain:  Vitals:   05/12/16 1215  TempSrc: Oral  PainSc:                  Johann Gascoigne,JAMES TERRILL

## 2016-05-12 NOTE — Progress Notes (Signed)
PROGRESS NOTE    Nicholas Hudson  [id number] DOB: [birth year] DOA: 04/16/2016 PCP: Kandice Hams, MD   Brief Narrative: 76 year old male with history of BPH, gout, recurrent sinus infection, hypothyroidism admitted with worsening renal failure. Patient was evaluated by nephrologist and underwent kidney biopsy. 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 Patient received 2 sessions of hemodialysis treatment. Now receiving plasma exchange to my steroid for ANCA glomerulonephritis. Patient also with upper GI bleeding for which patient was evaluated by gastroenterologist.  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   -Completed plasma exchange on 05/09/16 as per nephrologist -Patient has good urine output and serum creatinine level is stable at 2.2. -Okay to discharge by the nephrologist with outpatient follow-up. The paper prescription for Cytoxan and prednisone provided by nephrologist to the patient.  #Upper GI bleed due to duodenal ulcer status post clipping and the patient is currently on PPI. -s/p EGD 05/07/16 which showed 87mm duodenal ulcer with exposed vessel s/p Argon laser cauterization by GI -Patient with ongoing GI bleed and drop in hemoglobin level. Plan for endoscopy today by GI. Continue to monitor closely.  #Acute blood loss anemia: Anemia multifactorial including In the setting of GI bleed, due to renal failure and possibly in the setting of Cytoxan. - Hemoglobin 7.3 today. Undergoing GI endoscopy today. Repeat CBC in the morning. -Patient also with thrombocytopenia. Platelet 155 today.  #Sepsis of unclear source on admission, resolved now.  #Metabolic encephalopathy, acute: MRI showed no acute finding. Mental status improved.  #Hypothyroidism:  Continue Synthroid  #Hyperglycemia likely due to steroid: Continue to monitor blood sugar level. On sliding scale. Blood sugar has been stable and patient is not requiring insulin.   Principal Problem:   Acute glomerulonephritis Active Problems:   AKI (acute kidney injury) ([place])   Abnormal transaminases   Frequent headaches   Hypothyroidism   Hyponatremia   Sepsis ([place])   Metabolic encephalopathy   ANCA-associated vasculitis ([facility])   Acute renal failure ([facility])   Acute blood loss anemia  DVT prophylaxis: SCDs . The patient has severe anemia and thrombocytopenia Code Status: Full code Family Communication: No family present at bedside.  Disposition Plan: Likely discharge home in 1-2 days when stable from GI standpoint.  Ordered rolling walker and home care service to plan discharge.   Consultants:   Nephrologist  GI  General surgery  Hematology oncology  Procedures: Kidney biopsy on February 7 Placement of hemodialysis catheter on February 9 Echocardiogram Plasma exchange started on February 12  Antimicrobials: Initially started on vancomycin and Zosyn on admission and discontinued later.   Subjective: Patient was seen and examined at bedside. No new event. Planning for endoscopy today. Denied nausea vomiting chest pain or shortness of breath. Objective: Vitals:   05/11/16 1300 05/11/16 2125 05/12/16 0522 05/12/16 1107  BP: 128/81 (!) 156/73 (!) 161/65 (!) 144/52  Pulse: 62 60 66 (!) 58  Resp: 15 16 16 13   Temp: 98.4 F (36.9 C) 98.1 F (36.7 C) 98.4 F (36.9 C) 98.5 F (36.9 C)  TempSrc: Oral Oral Oral Oral  SpO2: 98% 100% 100% 99%  Weight:   78 kg (172 lb)   Height:        Intake/Output Summary (Last 24 hours) at 05/12/16 1125 Last data filed  at 05/12/16 0800  Gross per 24 hour  Intake              880 ml  Output             2925 ml  Net            -2045 ml   Filed Weights   05/10/16 0415 05/11/16 0407 05/12/16 0522  Weight: 81.8 kg (180 lb 6.4  oz) 80.1 kg (176 lb 9.6 oz) 78 kg (172 lb)    Examination:  General exam: Pleasant male, not in distress Respiratory system:  Clear bilateral, respiratory effort normal Cardiovascular system: S1 & S2 heard, RRR.   Gastrointestinal system:  Abdomen soft, nontender, nondistended. Bowel sounds positive. Central nervous system: Alert and oriented. No focal neurological deficits. Extremities: Symmetric 5 x 5 power. Edema improved Skin: No rashes, lesions or ulcers Psychiatry: Judgement and insight appear normal. Mood & affect appropriate.     Data Reviewed: I have personally reviewed following labs and imaging studies  CBC:  Recent Labs Lab 05/08/16 0506 05/09/16 0419 05/09/16 0858 05/09/16 1446 05/10/16 0502 05/11/16 0332 05/12/16 0407  WBC 4.7 3.7*  --   --  5.4 5.3 4.9  HGB 7.8* 7.2* 6.8* 8.3* 7.9* 7.4* 7.3*  HCT 23.7* 21.5* 20.0* 25.1* 23.9* 22.4* 22.2*  MCV 88.1 88.1  --   --  88.8 88.5 88.8  PLT 98* 107*  --   --  143* 145* [id number]   Basic Metabolic Panel:  Recent Labs Lab 05/07/16 0435 05/08/16 0506 05/09/16 0419 05/09/16 0858 05/10/16 0502 05/11/16 0332 05/12/16 0407  NA 143 146* 142 145 143 142 144  K 5.0 3.8 3.9 3.4* 3.7 4.2 5.0  CL 112* 113* 112* 104 110 110 113*  CO2 21* 24 23  --  25 23 24   GLUCOSE 96 94 108* 188* 102* 114* 84  BUN 44* 34* 37* 36* 37* 41* 43*  CREATININE 2.66* 2.47* 2.37* 2.40* 2.24* 2.28* 2.28*  CALCIUM 9.1 8.9 8.7*  --  8.9 8.8* 8.9  PHOS 3.9  --  2.9  --  3.2 3.5 3.8   GFR: Estimated Creatinine Clearance: 26.1 mL/min (by C-G formula based on SCr of 2.28 mg/dL (H)). Liver Function Tests:  Recent Labs Lab 05/07/16 0435 05/09/16 0419 05/10/16 0502 05/11/16 0332 05/12/16 0407  ALBUMIN 4.5 4.1 4.5 4.3 4.4   No results for input(s): LIPASE, AMYLASE in the last 168 hours. No results for input(s): AMMONIA in the last 168 hours. Coagulation Profile: No results for input(s): INR, PROTIME in the last 168 hours. Cardiac Enzymes: No  results for input(s): CKTOTAL, CKMB, CKMBINDEX, TROPONINI in the last 168 hours. BNP (last 3 results) No results for input(s): PROBNP in the last 8760 hours. HbA1C: No results for input(s): HGBA1C in the last 72 hours. CBG:  Recent Labs Lab 05/11/16 0733 05/11/16 1110 05/11/16 1637 05/11/16 2123 05/12/16 0733  GLUCAP 102* 151* 154* 107* 94   Lipid Profile: No results for input(s): CHOL, HDL, LDLCALC, TRIG, CHOLHDL, LDLDIRECT in the last 72 hours. Thyroid Function Tests: No results for input(s): TSH, T4TOTAL, FREET4, T3FREE, THYROIDAB in the last 72 hours. Anemia Panel: No results for input(s): VITAMINB12, FOLATE, FERRITIN, TIBC, IRON, RETICCTPCT in the last 72 hours. Sepsis Labs: No results for input(s): PROCALCITON, LATICACIDVEN in the last 168 hours.  No results found for this or any previous visit (from the past 240 hour(s)).       Radiology Studies: Ir Removal Tun Cv Cath W/o  Fl  Result Date: 05/11/2016 INDICATION: Patient with dialysis catheter placed on 04/22/16 by Dr. Pascal Lux. Request removal due to dialysis no longer needed. EXAM: REMOVAL TUNNELED CENTRAL VENOUS CATHETER MEDICATIONS: 1% lidocaine ANESTHESIA/SEDATION: None FLUOROSCOPY TIME:  None COMPLICATIONS: None immediate. PROCEDURE: Informed written consent was obtained from the patient after a thorough discussion of the procedural risks, benefits and alternatives. All questions were addressed. Maximal Sterile Barrier Technique was utilized including caps, mask, sterile gowns, sterile gloves, sterile drape, hand hygiene and skin antiseptic. A timeout was performed prior to the initiation of the procedure. The patient's right chest and catheter was prepped and draped in a normal sterile fashion. Heparin was removed from both ports of catheter. 1% lidocaine was used for local anesthesia. Using gentle blunt dissection the cuff of the catheter was exposed and the catheter was removed in it's entirety. Pressure was held till  hemostasis was obtained. A sterile dressing was applied. The patient tolerated the procedure well with no immediate complications. IMPRESSION: Successful catheter removal as described above. Read by:  Brynda Greathouse PA-C Electronically Signed   By: Markus Daft M.D.   On: 05/11/2016 15:41        Scheduled Meds: . [MAR Hold] cyclophosphamide  50 mg Oral Daily  . [MAR Hold] gabapentin  100 mg Oral QHS  . [MAR Hold] insulin aspart  0-15 Units Subcutaneous TID WC  . [MAR Hold] insulin aspart  0-5 Units Subcutaneous QHS  . [MAR Hold] insulin aspart  3 Units Subcutaneous TID WC  . [MAR Hold] levothyroxine  200 mcg Oral QAC breakfast  . [MAR Hold] pantoprazole  40 mg Oral BID  . [MAR Hold] polyethylene glycol  17 g Oral TID  . [MAR Hold] predniSONE  60 mg Oral Q breakfast  . [MAR Hold] sodium bicarbonate  650 mg Oral BID  . [MAR Hold] sulfamethoxazole-trimethoprim  1 tablet Oral Q M,W,F-1800   Continuous Infusions: . sodium chloride 20 mL/hr at 05/11/16 1400     LOS: 26 days    Dron Tanna Furry, MD [facility] Pager [phone]  If 7PM-7AM, please contact night-coverage [email or web address] Password [email or web address] 05/12/2016, 11:25 AM

## 2016-05-12 NOTE — Anesthesia Preprocedure Evaluation (Addendum)
Anesthesia Evaluation  Patient identified by MRN, date of birth, ID band Patient awake    Reviewed: Allergy & Precautions, NPO status , Patient's Chart, lab work & pertinent test results  Airway Mallampati: II  TM Distance: >3 FB Neck ROM: Full    Dental  (+) Dental Advisory Given, Caps   Pulmonary    breath sounds clear to auscultation       Cardiovascular  Rhythm:Regular Rate:Normal     Neuro/Psych    GI/Hepatic   Endo/Other  Hypothyroidism   Renal/GU Renal InsufficiencyRenal disease     Musculoskeletal   Abdominal   Peds  Hematology  (+) anemia ,   Anesthesia Other Findings   Reproductive/Obstetrics                            Anesthesia Physical Anesthesia Plan  ASA: III  Anesthesia Plan: MAC   Post-op Pain Management:    Induction: Intravenous  Airway Management Planned: Natural Airway  Additional Equipment:   Intra-op Plan:   Post-operative Plan:   Informed Consent: I have reviewed the patients History and Physical, chart, labs and discussed the procedure including the risks, benefits and alternatives for the proposed anesthesia with the patient or authorized representative who has indicated his/her understanding and acceptance.   Dental advisory given  Plan Discussed with:   Anesthesia Plan Comments:         Anesthesia Quick Evaluation

## 2016-05-12 NOTE — Transfer of Care (Signed)
Immediate Anesthesia Transfer of Care Note  Patient: Nicholas Hudson  Procedure(s) Performed: Procedure(s): ESOPHAGOGASTRODUODENOSCOPY (EGD) WITH PROPOFOL ([name])  Patient Location: PACU  Anesthesia Type:MAC  Level of Consciousness: awake, oriented and patient cooperative  Airway & Oxygen Therapy: Patient Spontanous Breathing and Patient connected to nasal cannula oxygen  Post-op Assessment: Report given to RN and Post -op Vital signs reviewed and stable  Post vital signs: Reviewed  Last Vitals:  Vitals:   05/12/16 1107 05/12/16 1215  BP: (!) 144/52 (!) 130/51  Pulse: (!) 58 (!) 59  Resp: 13 10  Temp: 36.9 C     Last Pain:  Vitals:   05/12/16 1107  TempSrc: Oral  PainSc:       Patients Stated Pain Goal: 0 (07/22/00 1117)  Complications: No apparent anesthesia complications

## 2016-05-12 NOTE — Progress Notes (Signed)
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 Fairly favorable renal biopsy - continue Prednisone and restarted cytoxan . Developed acute GI bleed secondary to duodenal ulcer treated with argon laser cauterization. Recurrent bleeding noted and to undergo repeat EGD  Objective:  Vital signs in last 24 hours:  Temp:  [98.1 F (36.7 C)-98.4 F (36.9 C)] 98.4 F (36.9 C) (03/01 0522) Pulse Rate:  [60-66] 66 (03/01 0522) Resp:  [15-16] 16 (03/01 0522) BP: (128-161)/(65-81) 161/65 (03/01 0522) SpO2:  [98 %-100 %] 100 % (03/01 0522) Weight:  [78 kg (172 lb)] 78 kg (172 lb) (03/01 0522)  Weight change: -2.087 kg (-4 lb 9.6 oz) Filed Weights   05/10/16 0415 05/11/16 0407 05/12/16 0522  Weight: 81.8 kg (180 lb 6.4 oz) 80.1 kg (176 lb 9.6 oz) 78 kg (172 lb)    Intake/Output: I/O last 3 completed shifts: In: 1720 [P.O.:1680; I.V.:40] Out: 4750 [Urine:4750]   Intake/Output this shift:  No intake/output data recorded.  CVS- RRR RS- CTA ABD- BS present soft non-distended EXT- no edema   Basic Metabolic Panel:  Recent Labs Lab 05/07/16 0435 05/08/16 0506 05/09/16 0419 05/09/16 0858 05/10/16 0502 05/11/16 0332 05/12/16 0407  NA 143 146* 142 145 143 142 144  K 5.0 3.8 3.9 3.4* 3.7 4.2 5.0  CL 112* 113* 112* 104 110 110 113*  CO2 21* 24 23  --  25 23 24   GLUCOSE 96 94 108* 188* 102* 114* 84  BUN 44* 34* 37* 36* 37* 41* 43*  CREATININE 2.66* 2.47* 2.37* 2.40* 2.24* 2.28* 2.28*  CALCIUM 9.1 8.9 8.7*  --  8.9 8.8* 8.9  PHOS 3.9  --  2.9  --  3.2 3.5 3.8    Liver Function Tests:  Recent Labs Lab 05/07/16 0435 05/09/16 0419 05/10/16 0502 05/11/16 0332 05/12/16 0407  ALBUMIN 4.5 4.1 4.5 4.3 4.4   No results for input(s): LIPASE, AMYLASE in the last 168 hours. No results for input(s): AMMONIA in the last 168 hours.  CBC:  Recent Labs Lab 05/08/16 0506 05/09/16 0419  05/09/16 0858 05/09/16 1446 05/10/16 0502 05/11/16 0332 05/12/16 0407  WBC 4.7 3.7*  --   --  5.4 5.3 4.9  HGB 7.8* 7.2* 6.8* 8.3* 7.9* 7.4* 7.3*  HCT 23.7* 21.5* 20.0* 25.1* 23.9* 22.4* 22.2*  MCV 88.1 88.1  --   --  88.8 88.5 88.8  PLT 98* 107*  --   --  143* 145* 155    Cardiac Enzymes: No results for input(s): CKTOTAL, CKMB, CKMBINDEX, TROPONINI in the last 168 hours.  BNP: Invalid input(s): POCBNP  CBG:  Recent Labs Lab 05/11/16 0733 05/11/16 1110 05/11/16 1637 05/11/16 2123 05/12/16 0733  GLUCAP 102* 151* 154* 107* 94    Microbiology: Results for orders placed or performed during the hospital encounter of 04/16/16  Culture, blood (Routine x 2)     Status: None   Collection Time: 04/16/16  3:32 PM  Result Value Ref Range Status   Specimen Description BLOOD RIGHT WRIST  Final   Special Requests BOTTLES DRAWN AEROBIC AND ANAEROBIC 5 CC EACH  Final   Culture   Final    NO GROWTH 5 DAYS Performed at [facility], [place] [address], [place], [place] [address]    Report Status 04/21/2016 FINAL  Final  Culture, blood (Routine x 2)     Status: None   Collection Time: 04/16/16  4:15 PM  Result Value Ref Range Status   Specimen Description BLOOD RIGHT HAND  Final   Special Requests BOTTLES DRAWN AEROBIC ONLY 5 CC  Final   Culture   Final    NO GROWTH 5 DAYS Performed at [facility], [address] [place], [place] [address]    Report Status 04/21/2016 FINAL  Final  Urine culture     Status: None   Collection Time: 04/16/16  4:23 PM  Result Value Ref Range Status   Specimen Description URINE, RANDOM  Final   Special Requests NONE  Final   Culture   Final    NO GROWTH Performed at [facility], [place] [address], [place], [place] [address]    Report Status 04/18/2016 FINAL  Final  Culture, blood (x 2)     Status: None   Collection Time: 04/17/16  8:19 AM  Result Value Ref Range Status   Specimen Description BLOOD LEFT ANTECUBITAL  Final    Special Requests BOTTLES DRAWN AEROBIC AND ANAEROBIC 5CC  Final   Culture   Final    NO GROWTH 5 DAYS Performed at [facility], [place] [address], [place], [place] [address]    Report Status 04/22/2016 FINAL  Final  Culture, blood (x 2)     Status: None   Collection Time: 04/17/16  8:27 AM  Result Value Ref Range Status   Specimen Description BLOOD RIGHT HAND  Final   Special Requests BOTTLES DRAWN AEROBIC AND ANAEROBIC 5CC  Final   Culture   Final    NO GROWTH 5 DAYS Performed at [facility], [place] [address] [place], [place] [address]    Report Status 04/22/2016 FINAL  Final  MRSA PCR Screening     Status: None   Collection Time: 04/26/16  2:02 PM  Result Value Ref Range Status   MRSA by PCR NEGATIVE NEGATIVE Final    Comment:        The GeneXpert MRSA Assay (FDA approved for NASAL specimens only), is one component of a comprehensive MRSA colonization surveillance program. It is not intended to diagnose MRSA infection nor to guide or monitor treatment for MRSA infections.   MRSA PCR Screening     Status: None   Collection Time: 04/29/16  4:42 PM  Result Value Ref Range Status   MRSA by PCR NEGATIVE NEGATIVE Final    Comment:        The GeneXpert MRSA Assay (FDA approved for NASAL specimens only), is one component of a comprehensive MRSA colonization surveillance program. It is not intended to diagnose MRSA infection nor to guide or monitor treatment for MRSA infections.     Coagulation Studies: No results for input(s): LABPROT, INR in the last 72 hours.  Urinalysis: No results for input(s): COLORURINE, LABSPEC, PHURINE, GLUCOSEU, HGBUR, BILIRUBINUR, KETONESUR, PROTEINUR, UROBILINOGEN, NITRITE, LEUKOCYTESUR in the last 72 hours.  Invalid input(s): APPERANCEUR    Imaging: Ir Removal Tun Cv Cath W/o Fl  Result Date: 05/11/2016 INDICATION: Patient with dialysis catheter placed on 04/22/16 by Dr. Pascal Lux. Request removal due to dialysis no longer needed.  EXAM: REMOVAL TUNNELED CENTRAL VENOUS CATHETER MEDICATIONS: 1% lidocaine ANESTHESIA/SEDATION: None FLUOROSCOPY TIME:  None COMPLICATIONS: None immediate. PROCEDURE: Informed written consent was obtained from the patient after a thorough discussion of the procedural risks, benefits and alternatives. All questions were addressed. Maximal Sterile Barrier Technique was utilized including caps, mask, sterile gowns, sterile gloves, sterile drape, hand hygiene and skin antiseptic. A timeout was performed  prior to the initiation of the procedure. The patient's right chest and catheter was prepped and draped in a normal sterile fashion. Heparin was removed from both ports of catheter. 1% lidocaine was used for local anesthesia. Using gentle blunt dissection the cuff of the catheter was exposed and the catheter was removed in it's entirety. Pressure was held till hemostasis was obtained. A sterile dressing was applied. The patient tolerated the procedure well with no immediate complications. IMPRESSION: Successful catheter removal as described above. Read by:  Brynda Greathouse PA-C Electronically Signed   By: Markus Daft M.D.   On: 05/11/2016 15:41     Medications:   . sodium chloride 20 mL/hr at 05/11/16 1400   . cyclophosphamide  50 mg Oral Daily  . gabapentin  100 mg Oral QHS  . insulin aspart  0-15 Units Subcutaneous TID WC  . insulin aspart  0-5 Units Subcutaneous QHS  . insulin aspart  3 Units Subcutaneous TID WC  . levothyroxine  200 mcg Oral QAC breakfast  . pantoprazole  40 mg Oral BID  . polyethylene glycol  17 g Oral TID  . predniSONE  60 mg Oral Q breakfast  . sodium bicarbonate  650 mg Oral BID  . sulfamethoxazole-trimethoprim  1 tablet Oral Q M,W,F-1800   acetaminophen **OR** acetaminophen, hydrALAZINE, ondansetron **OR** ondansetron (ZOFRAN) IV, traMADol, zolpidem  Assessment/ Plan:   AKI with MPO vasculitis Prednisone and TPE , will restart cytoxan at 50mg  daily - noted a drop in wbc  <4000  Hopefully will not require long term dialysis will continue to follow and watch for recovery of renal function -- may d/c dialysis catheter   Creatinine  Stable   Thrombocytopenia will follow Restarted cytoxan  Will sign off today  --- scripts given and will need 2 week follow up at [facility] for labs and follow up visit   LOS: 26 Jacqlyn Marolf W @TODAY @8 :16 AM

## 2016-05-13 ENCOUNTER — Encounter (HOSPITAL_COMMUNITY)
Admission: EM | Disposition: A | Discharge status: Home / Self Care | Payer: Self-pay | Source: Home / Self Care | Attending: Internal Medicine

## 2016-05-13 ENCOUNTER — Encounter (HOSPITAL_COMMUNITY): Payer: Self-pay | Admitting: Gastroenterology

## 2016-05-13 ENCOUNTER — Inpatient Hospital Stay (HOSPITAL_COMMUNITY): Payer: Medicare Other | Admitting: Anesthesiology

## 2016-05-13 DIAGNOSIS — R739 Hyperglycemia, unspecified: Secondary | ICD-10-CM

## 2016-05-13 HISTORY — PX: COLONOSCOPY: SHX5424

## 2016-05-13 LAB — BASIC METABOLIC PANEL
Anion gap: 9 (ref 5–15)
BUN: 36 mg/dL — AB (ref 6–20)
CHLORIDE: 109 mmol/L (ref 101–111)
CO2: 28 mmol/L (ref 22–32)
CREATININE: 2.24 mg/dL — AB (ref 0.61–1.24)
Calcium: 9 mg/dL (ref 8.9–10.3)
GFR calc Af Amer: 31 mL/min — ABNORMAL LOW (ref >60)
GFR, EST NON AFRICAN AMERICAN: 27 mL/min — AB (ref >60)
GLUCOSE: 121 mg/dL — AB (ref 65–99)
POTASSIUM: 4.2 mmol/L (ref 3.5–5.1)
SODIUM: 146 mmol/L — AB (ref 135–145)

## 2016-05-13 LAB — PREPARE RBC (CROSSMATCH)

## 2016-05-13 LAB — CBC
HCT: 22.3 % — ABNORMAL LOW (ref 39.0–52.0)
Hemoglobin: 7.2 g/dL — ABNORMAL LOW (ref 13.0–17.0)
MCH: 29 pg (ref 26.0–34.0)
MCHC: 32.3 g/dL (ref 30.0–36.0)
MCV: 89.9 fL (ref 78.0–100.0)
PLATELETS: 161 10*3/uL (ref 150–400)
RBC: 2.48 MIL/uL — ABNORMAL LOW (ref 4.22–5.81)
RDW: 17.1 % — ABNORMAL HIGH (ref 11.5–15.5)
WBC: 3.6 10*3/uL — ABNORMAL LOW (ref 4.0–10.5)

## 2016-05-13 LAB — GLUCOSE, CAPILLARY
Glucose-Capillary: 106 mg/dL — ABNORMAL HIGH (ref 65–99)
Glucose-Capillary: 78 mg/dL (ref 65–99)
Glucose-Capillary: 171 mg/dL — ABNORMAL HIGH (ref 65–99)

## 2016-05-13 LAB — HEMOGLOBIN AND HEMATOCRIT, BLOOD
HCT: 26.5 % — ABNORMAL LOW (ref 39.0–52.0)
Hemoglobin: 8.8 g/dL — ABNORMAL LOW (ref 13.0–17.0)

## 2016-05-13 SURGERY — COLONOSCOPY
Anesthesia: Monitor Anesthesia Care

## 2016-05-13 SURGERY — CANCELLED PROCEDURE
Anesthesia: Monitor Anesthesia Care

## 2016-05-13 MED ORDER — PREDNISONE 20 MG PO TABS: 60.0000 mg | ORAL_TABLET | Freq: Every day | ORAL | Status: AC

## 2016-05-13 MED ORDER — LACTATED RINGERS IV SOLN: INTRAVENOUS | Status: DC | PRN

## 2016-05-13 MED ORDER — CYCLOPHOSPHAMIDE 50 MG PO CAPS: 50.0000 mg | ORAL_CAPSULE | Freq: Every day | ORAL | Status: AC

## 2016-05-13 MED ORDER — SODIUM CHLORIDE 0.9 % IV SOLN: Freq: Once | INTRAVENOUS | Status: DC

## 2016-05-13 MED ORDER — SULFAMETHOXAZOLE-TRIMETHOPRIM 800-160 MG PO TABS: 1.0000 | ORAL_TABLET | ORAL | 0 refills | Status: AC

## 2016-05-13 MED ORDER — POLYETHYLENE GLYCOL 3350 17 G PO PACK: 17.0000 g | PACK | Freq: Every day | ORAL | 0 refills | Status: AC | PRN

## 2016-05-13 MED ORDER — PANTOPRAZOLE SODIUM 40 MG PO TBEC: 40.0000 mg | DELAYED_RELEASE_TABLET | Freq: Two times a day (BID) | ORAL | 0 refills | Status: AC

## 2016-05-13 MED ORDER — PROPOFOL 500 MG/50ML IV EMUL
INTRAVENOUS | Status: DC | PRN
  Administered 2016-05-13: 100 ug/kg/min via INTRAVENOUS

## 2016-05-13 MED ORDER — SODIUM BICARBONATE 650 MG PO TABS: 650.0000 mg | ORAL_TABLET | Freq: Every day | ORAL | 0 refills | Status: AC

## 2016-05-13 MED ORDER — ROSUVASTATIN CALCIUM 20 MG PO TABS: 10.0000 mg | ORAL_TABLET | Freq: Every day | ORAL | Status: AC

## 2016-05-13 MED ORDER — GLIMEPIRIDE 1 MG PO TABS: 1.0000 mg | ORAL_TABLET | Freq: Every day | ORAL | 0 refills | Status: AC

## 2016-05-13 NOTE — Anesthesia Procedure Notes (Signed)
Procedure Name: MAC Date/Time: 05/13/2016 10:03 AM Performed by: Carney Living Pre-anesthesia Checklist: Patient identified, Emergency Drugs available, Suction available, Patient being monitored and Timeout performed Patient Re-evaluated:Patient Re-evaluated prior to inductionOxygen Delivery Method: Nasal cannula

## 2016-05-13 NOTE — Progress Notes (Signed)
Nicholas Hudson 9:38 AM  Subjective: Patient with no new complaints and no signs of bleeding  Objective: Vital signs stable afebrile no acute distress exam please see preassessment evaluation labs stable  Assessment: GI blood loss  Plan: Okay to proceed with colonoscopy with anesthesia assistance  [facility] E  Pager [phone] After 5PM or if no answer call [phone]

## 2016-05-13 NOTE — Discharge Summary (Addendum)
Physician Discharge Summary  Nicholas DEHAY [id number] DOB: 23-Nov-1940 DOA: 04/16/2016  PCP: Kandice Hams, MD  Admit date: 04/16/2016 Discharge date: 05/13/2016  Admitted From:Home Disposition:Home  Recommendations for Outpatient Follow-up:  1. Follow up with PCP in 1-2 weeks 2. Please obtain BMP/CBC in one week  Home Health: no Equipment/Devices:[place] Discharge Condition:stable CODE STATUS:full Diet recommendation:heart healthy/carb modified.  Brief/Interim Summary: 76 year old male with history of BPH, gout, recurrent sinus infection, hypothyroidism admitted with worsening renal failure. Patient was evaluated by nephrologist and underwent kidney biopsy. 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 Patient received 2 sessions of hemodialysis treatment. Now receiving plasma exchange to my steroid for ANCA glomerulonephritis. Patient also with upper GI bleeding for which patient was evaluated by gastroenterologist.  # Acute kidney injury due to MPO ANCA vasculitis with RPGN: -Status post kidney biopsy on 04/20/16  with pauci immune GN, 40% crescents, ATN, minimal scarring -S/p pulse solumedrol, now on po prednisone, cytoxan, Bactrim as per nephrologist. -Completed plasma exchange on 05/09/16  -Patient has good urine output and serum creatinine level is stable at 2.2. -Okay to discharge by the nephrologist with outpatient follow-up. The paper prescription for Cytoxan and prednisone provided by nephrologist to the patient.  #Upper GI bleed due to duodenal ulcer status post clipping and the patient is currently on PPI. -s/p EGD 05/07/16 which showed 4mm duodenal ulcer with exposed vessel s/p Argon laser cauterization by GI -She was reevaluated by GI and underwent upper and lower GI endoscopy. Colonoscopy with diverticulosis and polyp. Patient with hemoglobin of 7.2 today, plan for 1 unit of red blood cell transfusion. Patient verbalized understanding. I advised patient to  monitor CBC and BMP in one week. -Patient is on Protonix twice a day for now. Recommended to follow-up with GI to possibly adjust the dose. -Patient is tolerating diet well.  #Acute blood loss anemia: Anemia multifactorial including In the setting of GI bleed, due to renal failure and possibly in the setting of Cytoxan. - Hemoglobin 7.2 today. PRBC transfusion today. -Patient also with thrombocytopenia. Platelet count improved to 161 today.  #Sepsis of unclear source on admission, resolved now.  #Metabolic encephalopathy, acute: MRI showed no acute finding. Mental status improved.  #Hypothyroidism: Continue Synthroid  #Hyperglycemia likely due to steroid: Patient was receiving short-acting insulin in the hospital. Unable to use metformin because of kidney failure. I'm planning to start glimepiride once a day. Discussed with the patient and his wife at bedside to monitor blood sugar level at home. Also educated that the blood sugars may drop with lowering of prednisone. Recommended to follow up with PCP. Patient's wife reported that she has supplies for diabetes and can check blood sugar at home.  Patient with prolonged hospitalization. Kidney function stable with serum creatinine level of 2.2. Patient has good urine output. Electrolytes acceptable. Hemoglobin slowly trending down to 7.2 and underwent upper and lower GI endoscopy by GI. No further bleeding noticed. Plan to transfuse a unit of red blood cells today. Hemoglobin will be checked after completion of red blood cell transfusion and later discharged home. [place] and home care services for discharge. I discussed with the patient and his wife extensively about his medications and follow-ups. They verbalized understanding.   Discharge Diagnoses:  Principal Problem:   Acute glomerulonephritis Active Problems:   AKI (acute kidney injury) ([place])   Abnormal transaminases   Frequent headaches   Hypothyroidism   Hyponatremia   Sepsis  ([place])   Metabolic encephalopathy   ANCA-associated vasculitis (  [place])   Acute renal failure (H[facility])   Acute blood loss anemia   Hyperglycemia    Discharge Instructions  Discharge Instructions    Call MD for:  difficulty breathing, headache or visual disturbances    Complete by:  As directed    Call MD for:  extreme fatigue    Complete by:  As directed    Call MD for:  hives    Complete by:  As directed    Call MD for:  persistant dizziness or light-headedness    Complete by:  As directed    Call MD for:  persistant nausea and vomiting    Complete by:  As directed    Call MD for:  severe uncontrolled pain    Complete by:  As directed    Call MD for:  temperature >100.4    Complete by:  As directed    Diet - low sodium heart healthy    Complete by:  As directed    Diet Carb Modified    Complete by:  As directed    Discharge instructions    Complete by:  As directed    Please follow up with nephrologist for cytoxan and prednisone related issue, Please follow up with GI in one week to follow up the biopsy result and to discuss the medications,  Please check CBC and BMP in one week   Increase activity slowly    Complete by:  As directed      Allergies as of 05/13/2016   No Known Allergies     Medication List    TAKE these medications   allopurinol 100 MG tablet Commonly known as:  ZYLOPRIM Take 1 tablet (100 mg total) by mouth daily.   aspirin EC 81 MG tablet Take 81 mg by mouth daily.   cholecalciferol 1000 units tablet Commonly known as:  VITAMIN D Take 2,000 Units by mouth daily.   cyanocobalamin 2000 MCG tablet Take 2,000 mcg by mouth daily.   cyclophosphamide 50 MG capsule Commonly known as:  CYTOXAN Take 1 capsule (50 mg total) by mouth daily. Give on an empty stomach 1 hour before or 2 hours after meals. Follow up with nephrologist   finasteride 5 MG tablet Commonly known as:  PROSCAR Take 5 mg by mouth daily.   Fish Oil 1000 MG Caps Take 1,000 mg by  mouth daily.   gabapentin 300 MG capsule Commonly known as:  NEURONTIN Take 300 mg by mouth at bedtime.   glimepiride 1 MG tablet Commonly known as:  AMARYL Take 1 tablet (1 mg total) by mouth daily with breakfast.   levothyroxine 200 MCG tablet Commonly known as:  SYNTHROID, LEVOTHROID Take 200 mcg by mouth daily before breakfast.   pantoprazole 40 MG tablet Commonly known as:  PROTONIX Take 1 tablet (40 mg total) by mouth 2 (two) times daily.   polyethylene glycol packet Commonly known as:  MIRALAX / GLYCOLAX Take 17 g by mouth daily as needed.   predniSONE 20 MG tablet Commonly known as:  DELTASONE Take 3 tablets (60 mg total) by mouth daily with breakfast. As per nephrologist, please follow up with Nephrologist in 1-2 weeks. Start taking on:  05/14/2016   rosuvastatin 20 MG tablet Commonly known as:  CRESTOR Take 0.5 tablets (10 mg total) by mouth daily. What changed:  how much to take   sodium bicarbonate 650 MG tablet Take 1 tablet (650 mg total) by mouth daily.   sulfamethoxazole-trimethoprim 800-160 MG tablet Commonly known as:  BACTRIM DS,SEPTRA DS Take 1 tablet by mouth every Monday, Wednesday, and Friday at 6 PM.            Durable Medical Equipment        Start     Ordered   05/11/16 1022  For home use only DME Walker rolling  Once    Question:  Patient needs a walker to treat with the following condition  Answer:  Weakness   05/11/16 1021   05/11/16 1022  For home use only DME 3 n 1  Once     05/11/16 1021   04/27/16 0907  For home use only DME 3 n 1  Once     04/27/16 0906     Follow-up Information    POLITE,RONALD D, MD. Schedule an appointment as soon as possible for a visit in 1 week(s).   Specialty:  Internal Medicine Contact information: [address] [place] [address] [phone]        [facility] - [facility] Follow up.   Why:  Vassie Moselle, 3n1 Contact information: [address] [place]  [place] [address] [phone]        [facility], MD. Schedule an appointment as soon as possible for a visit in 1 week(s).   Specialty:  Gastroenterology Contact information: [id number] [place] [address] [phone]        Sherril Croon, MD. Schedule an appointment as soon as possible for a visit in 2 week(s).   Specialty:  Nephrology Contact information: [place] [address] [phone]          No Known Allergies  Consultations:  Nephrologist  GI  General surgery  Hematology oncology  Procedures/Studies: Kidney biopsy on February 7 Placement of hemodialysis catheter on February 9 which was removed later Echocardiogram Plasma exchange started on February 12 and completed  Subjective: Patient was seen and examined at bedside. Patient came from colonoscopy. He was sitting on chair. Very pleasant and reported doing well. Denied fever, chills, headache, dizziness, chest pain, shortness of breath,. He has chronic weakness. No diarrhea. Wife at bedside.  Discharge Exam: Vitals:   05/13/16 1256 05/13/16 1332  BP: (!) 130/59 132/62  Pulse: 85 86  Resp: 18 18  Temp: 97.9 F (36.6 C) 97.7 F (36.5 C)   Vitals:   05/13/16 1045 05/13/16 1050 05/13/16 1256 05/13/16 1332  BP: (!) 144/74 (!) 157/78 (!) 130/59 132/62  Pulse: 75 71 85 86  Resp: 12 19 18 18   Temp: 97.8 F (36.6 C)  97.9 F (36.6 C) 97.7 F (36.5 C)  TempSrc: Oral  Oral Oral  SpO2: 100% 100% 99% 99%  Weight:      Height:        General: Pt is alert, awake, not in acute distress Cardiovascular: RRR, S1/S2 +, no rubs, no gallops Respiratory: CTA bilaterally, no wheezing, no rhonchi Abdominal: Soft, NT, ND, bowel sounds + Extremities: no edema, no cyanosis Neurology: Alert, awake, following commands. Muscle strength 5 over 5 in all extremities.   The results of significant diagnostics from this hospitalization (including imaging, microbiology, ancillary and  laboratory) are listed below for reference.     Microbiology: No results found for this or any previous visit (from the past 240 hour(s)).   Labs: BNP (last 3 results) No results for input(s): BNP in the last 8760 hours. Basic Metabolic Panel:  Recent Labs Lab 05/07/16 0435  05/09/16 0419 05/09/16 0858 05/10/16  0502 05/11/16 0332 05/12/16 0407 05/13/16 0455  NA 143  < > 142 145 143 142 144 146*  K 5.0  < > 3.9 3.4* 3.7 4.2 5.0 4.2  CL 112*  < > 112* 104 110 110 113* 109  CO2 21*  < > 23  --  25 23 24 28   GLUCOSE 96  < > 108* 188* 102* 114* 84 121*  BUN 44*  < > 37* 36* 37* 41* 43* 36*  CREATININE 2.66*  < > 2.37* 2.40* 2.24* 2.28* 2.28* 2.24*  CALCIUM 9.1  < > 8.7*  --  8.9 8.8* 8.9 9.0  PHOS 3.9  --  2.9  --  3.2 3.5 3.8  --   < > = values in this interval not displayed. Liver Function Tests:  Recent Labs Lab 05/07/16 0435 05/09/16 0419 05/10/16 0502 05/11/16 0332 05/12/16 0407  ALBUMIN 4.5 4.1 4.5 4.3 4.4   No results for input(s): LIPASE, AMYLASE in the last 168 hours. No results for input(s): AMMONIA in the last 168 hours. CBC:  Recent Labs Lab 05/09/16 0419  05/09/16 1446 05/10/16 0502 05/11/16 0332 05/12/16 0407 05/13/16 0455  WBC 3.7*  --   --  5.4 5.3 4.9 3.6*  HGB 7.2*  < > 8.3* 7.9* 7.4* 7.3* 7.2*  HCT 21.5*  < > 25.1* 23.9* 22.4* 22.2* 22.3*  MCV 88.1  --   --  88.8 88.5 88.8 89.9  PLT 107*  --   --  143* 145* 155 161  < > = values in this interval not displayed. Cardiac Enzymes: No results for input(s): CKTOTAL, CKMB, CKMBINDEX, TROPONINI in the last 168 hours. BNP: Invalid input(s): POCBNP CBG:  Recent Labs Lab 05/12/16 1308 05/12/16 1629 05/12/16 2106 05/13/16 0734 05/13/16 1115  GLUCAP 77 135* 125* 106* 78   D-Dimer No results for input(s): DDIMER in the last 72 hours. Hgb A1c No results for input(s): HGBA1C in the last 72 hours. Lipid Profile No results for input(s): CHOL, HDL, LDLCALC, TRIG, CHOLHDL, LDLDIRECT in the last  72 hours. Thyroid function studies No results for input(s): TSH, T4TOTAL, T3FREE, THYROIDAB in the last 72 hours.  Invalid input(s): FREET3 Anemia work up No results for input(s): VITAMINB12, FOLATE, FERRITIN, TIBC, IRON, RETICCTPCT in the last 72 hours. Urinalysis    Component Value Date/Time   COLORURINE YELLOW (A) 04/16/2016 1623   APPEARANCEUR HAZY (A) 04/16/2016 1623   LABSPEC 1.004 (L) 04/16/2016 1623   PHURINE 5.0 04/16/2016 1623   GLUCOSEU NEGATIVE 04/16/2016 1623   HGBUR MODERATE (A) 04/16/2016 1623   BILIRUBINUR NEGATIVE 04/16/2016 1623   KETONESUR NEGATIVE 04/16/2016 1623   PROTEINUR 30 (A) 04/16/2016 1623   NITRITE NEGATIVE 04/16/2016 1623   LEUKOCYTESUR NEGATIVE 04/16/2016 1623   Sepsis Labs Invalid input(s): PROCALCITONIN,  WBC,  LACTICIDVEN Microbiology No results found for this or any previous visit (from the past 240 hour(s)).   Time coordinating discharge: 36 minutes  SIGNED:   Rosita Fire, MD  [facility] 05/13/2016, 1:42 PM  If 7PM-7AM, please contact night-coverage [email or web address] Password [email or web address]

## 2016-05-13 NOTE — Anesthesia Procedure Notes (Signed)
Procedure Name: MAC Date/Time: 05/13/2016 10:30 AM Performed by: Carney Living Oxygen Delivery Method: Simple face mask

## 2016-05-13 NOTE — Op Note (Signed)
[facility] Patient Name: Nicholas Hudson Procedure Date : 05/13/2016 MRN: [id number] Attending MD: Clarene Essex , MD Date of Birth: 08-30-1940 CSN: [id number] Age: 76 Admit Type: Inpatient Procedure:                Colonoscopy Indications:              Last colonoscopy: 2014, Melena anemia Providers:                Clarene Essex, MD, Carolynn Comment, RN, Alfonso Patten,                            Technician, Kerrie Pleasure, CRNA Referring MD:              Medicines:                Propofol total dose [id number] mg IV Complications:            No immediate complications. Estimated Blood Loss:     Estimated blood loss: none. Procedure:                Pre-Anesthesia Assessment:                           - Prior to the procedure, a History and Physical                            was performed, and patient medications and                            allergies were reviewed. The patient's tolerance of                            previous anesthesia was also reviewed. The risks                            and benefits of the procedure and the sedation                            options and risks were discussed with the patient.                            All questions were answered, and informed consent                            was obtained. Prior Anticoagulants: The patient has                            taken no previous anticoagulant or antiplatelet                            agents. ASA Grade Assessment: II - A patient with                            mild systemic disease. After reviewing the risks  and benefits, the patient was deemed in                            satisfactory condition to undergo the procedure.                           After obtaining informed consent, the colonoscope                            was passed under direct vision. Throughout the                            procedure, the patient's blood pressure, pulse, and   oxygen saturations were monitored continuously. The                            [id number] scope was introduced through                            the anus and advanced to the the terminal ileum.                            The terminal ileum, ileocecal valve, appendiceal                            orifice, and rectum were photographed. The                            colonoscopy was performed without difficulty. The                            patient tolerated the procedure well. The quality                            of the bowel preparation was adequate. abdominal                            pressure was applied Scope In: 10:10:38 AM Scope Out: 10:33:59 AM Scope Withdrawal Time: 0 hours 14 minutes 8 seconds  Total Procedure Duration: 0 hours 23 minutes 21 seconds  Findings:      External and internal hemorrhoids were found during retroflexion, during       perianal exam and during digital exam. The hemorrhoids were small.      A few small-mouthed diverticula were found in the sigmoid colon and       descending colon.      A diminutive polyp was found in the mid transverse colon. The polyp was       semi-sessile. Biopsies were taken with a cold forceps for histology.      The terminal ileum appeared normal.      The exam was otherwise without abnormality.no blood was seen throughout       the procedure Impression:               - External and internal hemorrhoids.                           -  Diverticulosis in the sigmoid colon and in the                            descending colon.                           - One diminutive polyp in the mid transverse colon.                            Biopsied.                           - The examined portion of the ileum was normal.                           - The examination was otherwise normal. Moderate Sedation:      moderate sedation-none Recommendation:           - Patient has a contact number available for                             emergencies. The signs and symptoms of potential                            delayed complications were discussed with the                            patient. Return to normal activities tomorrow.                            Written discharge instructions were provided to the                            patient.                           - Soft diet today. can go home from our standpoint                            either later today or tomorrow and consider 1 more                            transfusion before hand or IV iron or                            erythropoietin per renal team                           - Continue present medications. no aspirin or                            nonsteroidals long-term                           - Await pathology results.                           -  consider a Repeat colonoscopy in 5-10 years for                            surveillance based on pathology results.                           - Return to GI office in 1 week.to repeat labs and                            recheck symptoms and probably lower Protonix dose                            to once a day seen                           - Telephone GI clinic for pathology results in 1                            week. and if outpatient signs of continual bleeding                            consider repeat endoscopy and capsule endoscopy in                            the future                           - Telephone GI clinic if symptomatic PRN. Procedure Code(s):        --- Professional ---                           [phone], Colonoscopy, flexible; with biopsy, single                            or multiple Diagnosis Code(s):        --- Professional ---                           D12.3, Benign neoplasm of transverse colon (hepatic                            flexure or splenic flexure)                           K92.1, Melena (includes Hematochezia)                           K57.30, Diverticulosis of large intestine  without                            perforation or abscess without bleeding CPT copyright 2016 [employer]. All rights reserved. The codes documented in this report are preliminary and upon coder review may  be revised to meet current compliance requirements. Clarene Essex, MD 05/13/2016 10:47:41 AM This report has been signed electronically. Number of Addenda: 0

## 2016-05-13 NOTE — Anesthesia Preprocedure Evaluation (Addendum)
Anesthesia Evaluation  Patient identified by MRN, date of birth, ID band Patient awake    Reviewed: Allergy & Precautions, NPO status , Patient's Chart, lab work & pertinent test results  History of Anesthesia Complications Negative for: history of anesthetic complications  Airway Mallampati: II  TM Distance: >3 FB Neck ROM: Full    Dental  (+) Teeth Intact, Dental Advisory Given, Missing, Partial Upper   Pulmonary neg pulmonary ROS,    breath sounds clear to auscultation       Cardiovascular (-) angina Rhythm:Regular Rate:Normal  2/18 ECHO: EF 50-55%, mild MR   Neuro/Psych  Headaches,    GI/Hepatic GI bleed   Endo/Other  Hypothyroidism Vasculitis: cytoxan  Renal/GU Renal InsufficiencyRenal disease (creat 2.24)     Musculoskeletal   Abdominal (+) - obese,   Peds  Hematology  (+) Blood dyscrasia (Hb 7.2), anemia ,   Anesthesia Other Findings   Reproductive/Obstetrics                           Anesthesia Physical Anesthesia Plan  ASA: III  Anesthesia Plan: MAC   Post-op Pain Management:    Induction: Intravenous  Airway Management Planned: Nasal Cannula  Additional Equipment:   Intra-op Plan:   Post-operative Plan:   Informed Consent: I have reviewed the patients History and Physical, chart, labs and discussed the procedure including the risks, benefits and alternatives for the proposed anesthesia with the patient or authorized representative who has indicated his/her understanding and acceptance.   Dental advisory given  Plan Discussed with: CRNA, Anesthesiologist and Surgeon  Anesthesia Plan Comments: (Plan routine monitors, MAC)       Anesthesia Quick Evaluation

## 2016-05-13 NOTE — Anesthesia Postprocedure Evaluation (Addendum)
Anesthesia Post Note  Patient: Nicholas Hudson  Procedure(s) Performed: Procedure(s) (LRB): COLONOSCOPY ([name])  Patient location during evaluation: Endoscopy Anesthesia Type: MAC Level of consciousness: awake and alert, patient cooperative and oriented Pain management: pain level controlled Vital Signs Assessment: post-procedure vital signs reviewed and stable Respiratory status: spontaneous breathing, nonlabored ventilation, respiratory function stable and patient connected to nasal cannula oxygen Cardiovascular status: blood pressure returned to baseline and stable Postop Assessment: no signs of nausea or vomiting Anesthetic complications: no       Last Vitals:  Vitals:   05/13/16 1045 05/13/16 1050  BP: (!) 144/74 (!) 157/78  Pulse: 75 71  Resp: 12 19  Temp: 36.6 C     Last Pain:  Vitals:   05/13/16 1045  TempSrc: Oral  PainSc:                  Loveah Like,E. Brita Jurgensen

## 2016-05-13 NOTE — Care Management Important Message (Signed)
Important Message  Patient Details  Name: Nicholas Hudson MRN: [id number] Date of Birth: June 04, 1940   Medicare Important Message Given:  Yes    Nathen May 05/13/2016, 1:13 PM

## 2016-05-13 NOTE — Transfer of Care (Signed)
Immediate Anesthesia Transfer of Care Note  Patient: Nicholas Hudson  Procedure(s) Performed: Procedure(s): COLONOSCOPY ([name])  Patient Location: Endoscopy Unit  Anesthesia Type:MAC  Level of Consciousness: awake, alert , oriented and patient cooperative  Airway & Oxygen Therapy: Patient Spontanous Breathing and Patient connected to nasal cannula oxygen  Post-op Assessment: Report given to RN, Post -op Vital signs reviewed and stable and Patient moving all extremities X 4  Post vital signs: Reviewed and stable  Last Vitals:  Vitals:   05/13/16 0923 05/13/16 0947  BP: (!) 160/76 (!) 175/59  Pulse: 68 74  Resp: 12 12  Temp:  36.8 C    Last Pain:  Vitals:   05/13/16 0947  TempSrc: Oral  PainSc:       Patients Stated Pain Goal: 0 (03/50/09 3818)  Complications: No apparent anesthesia complications

## 2016-05-14 LAB — TYPE AND SCREEN
ABO/RH(D): A POS
Antibody Screen: NEGATIVE
Unit division: 0

## 2016-05-14 LAB — BPAM RBC
BLOOD PRODUCT EXPIRATION DATE: 201803272359
ISSUE DATE / TIME: 201803021302
Unit Type and Rh: 6200

## 2016-05-15 NOTE — Progress Notes (Signed)
All d/c paperwork given to pt and wife at bedside.  D/C instructions and med details were reviewed thoroughly with pt and wife.  All Prescriptions given to wife.  Pt aware that HGB is up to 8.8 after his blood transfusion earlier today.  Pt in no acute distress and ready to go home.

## 2016-05-16 ENCOUNTER — Encounter (HOSPITAL_COMMUNITY): Payer: Self-pay | Admitting: Gastroenterology

## 2016-05-17 DIAGNOSIS — N059 Unspecified nephritic syndrome with unspecified morphologic changes: Secondary | ICD-10-CM | POA: Diagnosis not present

## 2016-05-17 DIAGNOSIS — E039 Hypothyroidism, unspecified: Secondary | ICD-10-CM | POA: Diagnosis not present

## 2016-05-17 DIAGNOSIS — D649 Anemia, unspecified: Secondary | ICD-10-CM | POA: Diagnosis not present

## 2016-05-18 DIAGNOSIS — N179 Acute kidney failure, unspecified: Secondary | ICD-10-CM | POA: Diagnosis not present

## 2016-05-18 DIAGNOSIS — D631 Anemia in chronic kidney disease: Secondary | ICD-10-CM | POA: Diagnosis not present

## 2016-05-25 DIAGNOSIS — N179 Acute kidney failure, unspecified: Secondary | ICD-10-CM | POA: Diagnosis not present

## 2016-05-25 DIAGNOSIS — G4730 Sleep apnea, unspecified: Secondary | ICD-10-CM | POA: Diagnosis not present

## 2016-05-25 DIAGNOSIS — K260 Acute duodenal ulcer with hemorrhage: Secondary | ICD-10-CM | POA: Diagnosis not present

## 2016-05-25 DIAGNOSIS — N059 Unspecified nephritic syndrome with unspecified morphologic changes: Secondary | ICD-10-CM | POA: Diagnosis not present

## 2016-06-07 DIAGNOSIS — D631 Anemia in chronic kidney disease: Secondary | ICD-10-CM | POA: Diagnosis not present

## 2016-06-07 DIAGNOSIS — E785 Hyperlipidemia, unspecified: Secondary | ICD-10-CM | POA: Diagnosis not present

## 2016-06-07 DIAGNOSIS — M3131 Wegener's granulomatosis with renal involvement: Secondary | ICD-10-CM | POA: Diagnosis not present

## 2016-06-07 DIAGNOSIS — M3130 Wegener's granulomatosis without renal involvement: Secondary | ICD-10-CM | POA: Diagnosis not present

## 2016-06-07 DIAGNOSIS — N179 Acute kidney failure, unspecified: Secondary | ICD-10-CM | POA: Diagnosis not present

## 2016-06-07 DIAGNOSIS — N184 Chronic kidney disease, stage 4 (severe): Secondary | ICD-10-CM | POA: Diagnosis not present

## 2016-06-08 DIAGNOSIS — K260 Acute duodenal ulcer with hemorrhage: Secondary | ICD-10-CM | POA: Diagnosis not present

## 2016-06-16 DIAGNOSIS — G4733 Obstructive sleep apnea (adult) (pediatric): Secondary | ICD-10-CM | POA: Diagnosis not present

## 2016-06-21 DIAGNOSIS — E039 Hypothyroidism, unspecified: Secondary | ICD-10-CM | POA: Diagnosis not present

## 2016-06-21 DIAGNOSIS — Z1389 Encounter for screening for other disorder: Secondary | ICD-10-CM | POA: Diagnosis not present

## 2016-06-21 DIAGNOSIS — K269 Duodenal ulcer, unspecified as acute or chronic, without hemorrhage or perforation: Secondary | ICD-10-CM | POA: Diagnosis not present

## 2016-06-21 DIAGNOSIS — G4733 Obstructive sleep apnea (adult) (pediatric): Secondary | ICD-10-CM | POA: Diagnosis not present

## 2016-06-21 DIAGNOSIS — M3131 Wegener's granulomatosis with renal involvement: Secondary | ICD-10-CM | POA: Diagnosis not present

## 2016-06-21 DIAGNOSIS — Z0000 Encounter for general adult medical examination without abnormal findings: Secondary | ICD-10-CM | POA: Diagnosis not present

## 2016-06-27 DIAGNOSIS — R361 Hematospermia: Secondary | ICD-10-CM | POA: Diagnosis not present

## 2016-07-05 DIAGNOSIS — Z6828 Body mass index (BMI) 28.0-28.9, adult: Secondary | ICD-10-CM | POA: Diagnosis not present

## 2016-07-05 DIAGNOSIS — M3131 Wegener's granulomatosis with renal involvement: Secondary | ICD-10-CM | POA: Diagnosis not present

## 2016-07-05 DIAGNOSIS — N184 Chronic kidney disease, stage 4 (severe): Secondary | ICD-10-CM | POA: Diagnosis not present

## 2016-07-05 DIAGNOSIS — N179 Acute kidney failure, unspecified: Secondary | ICD-10-CM | POA: Diagnosis not present

## 2016-07-05 DIAGNOSIS — M3130 Wegener's granulomatosis without renal involvement: Secondary | ICD-10-CM | POA: Diagnosis not present

## 2016-07-05 DIAGNOSIS — R938 Abnormal findings on diagnostic imaging of other specified body structures: Secondary | ICD-10-CM | POA: Diagnosis not present

## 2016-07-05 DIAGNOSIS — R361 Hematospermia: Secondary | ICD-10-CM | POA: Diagnosis not present

## 2016-07-05 DIAGNOSIS — E785 Hyperlipidemia, unspecified: Secondary | ICD-10-CM | POA: Diagnosis not present

## 2016-07-05 DIAGNOSIS — D631 Anemia in chronic kidney disease: Secondary | ICD-10-CM | POA: Diagnosis not present

## 2016-07-11 DIAGNOSIS — K260 Acute duodenal ulcer with hemorrhage: Secondary | ICD-10-CM | POA: Diagnosis not present

## 2016-07-18 DIAGNOSIS — G4733 Obstructive sleep apnea (adult) (pediatric): Secondary | ICD-10-CM | POA: Diagnosis not present

## 2016-07-27 ENCOUNTER — Other Ambulatory Visit: Payer: Self-pay | Admitting: Nephrology

## 2016-07-27 DIAGNOSIS — N184 Chronic kidney disease, stage 4 (severe): Secondary | ICD-10-CM

## 2016-08-04 ENCOUNTER — Ambulatory Visit
Admission: RE | Admit: 2016-08-04 | Discharge: 2016-08-04 | Disposition: A | Discharge status: Home / Self Care | Payer: Medicare Other | Source: Ambulatory Visit | Attending: Nephrology | Admitting: Nephrology

## 2016-08-04 DIAGNOSIS — N184 Chronic kidney disease, stage 4 (severe): Secondary | ICD-10-CM

## 2016-08-05 DIAGNOSIS — N184 Chronic kidney disease, stage 4 (severe): Secondary | ICD-10-CM | POA: Diagnosis not present

## 2016-08-05 DIAGNOSIS — M3130 Wegener's granulomatosis without renal involvement: Secondary | ICD-10-CM | POA: Diagnosis not present

## 2016-08-09 DIAGNOSIS — E785 Hyperlipidemia, unspecified: Secondary | ICD-10-CM | POA: Diagnosis not present

## 2016-08-09 DIAGNOSIS — M3130 Wegener's granulomatosis without renal involvement: Secondary | ICD-10-CM | POA: Diagnosis not present

## 2016-08-09 DIAGNOSIS — N179 Acute kidney failure, unspecified: Secondary | ICD-10-CM | POA: Diagnosis not present

## 2016-08-09 DIAGNOSIS — M3131 Wegener's granulomatosis with renal involvement: Secondary | ICD-10-CM | POA: Diagnosis not present

## 2016-08-09 DIAGNOSIS — N184 Chronic kidney disease, stage 4 (severe): Secondary | ICD-10-CM | POA: Diagnosis not present

## 2016-08-09 DIAGNOSIS — D631 Anemia in chronic kidney disease: Secondary | ICD-10-CM | POA: Diagnosis not present

## 2016-08-09 DIAGNOSIS — Z6828 Body mass index (BMI) 28.0-28.9, adult: Secondary | ICD-10-CM | POA: Diagnosis not present

## 2016-08-12 NOTE — Addendum Note (Signed)
Addendum  created 08/12/16 1216 by Rica Koyanagi, MD   Sign clinical note

## 2016-08-15 NOTE — Addendum Note (Signed)
Addendum  created 08/15/16 1138 by Oleta Mouse, MD   Sign clinical note

## 2016-09-13 DIAGNOSIS — M3131 Wegener's granulomatosis with renal involvement: Secondary | ICD-10-CM | POA: Diagnosis not present

## 2016-09-13 DIAGNOSIS — N184 Chronic kidney disease, stage 4 (severe): Secondary | ICD-10-CM | POA: Diagnosis not present

## 2016-09-13 DIAGNOSIS — D631 Anemia in chronic kidney disease: Secondary | ICD-10-CM | POA: Diagnosis not present

## 2016-09-13 DIAGNOSIS — N179 Acute kidney failure, unspecified: Secondary | ICD-10-CM | POA: Diagnosis not present

## 2016-09-13 DIAGNOSIS — E785 Hyperlipidemia, unspecified: Secondary | ICD-10-CM | POA: Diagnosis not present

## 2016-09-13 DIAGNOSIS — Z6829 Body mass index (BMI) 29.0-29.9, adult: Secondary | ICD-10-CM | POA: Diagnosis not present

## 2016-09-13 DIAGNOSIS — M3130 Wegener's granulomatosis without renal involvement: Secondary | ICD-10-CM | POA: Diagnosis not present

## 2016-09-20 ENCOUNTER — Other Ambulatory Visit: Payer: Self-pay | Admitting: Nephrology

## 2016-09-20 DIAGNOSIS — N184 Chronic kidney disease, stage 4 (severe): Secondary | ICD-10-CM

## 2016-09-22 ENCOUNTER — Ambulatory Visit
Admission: RE | Admit: 2016-09-22 | Discharge: 2016-09-22 | Disposition: A | Discharge status: Home / Self Care | Payer: Medicare Other | Source: Ambulatory Visit | Attending: Nephrology | Admitting: Nephrology

## 2016-09-22 DIAGNOSIS — N184 Chronic kidney disease, stage 4 (severe): Secondary | ICD-10-CM | POA: Diagnosis not present

## 2016-09-23 DIAGNOSIS — N184 Chronic kidney disease, stage 4 (severe): Secondary | ICD-10-CM | POA: Diagnosis not present

## 2016-09-26 NOTE — Addendum Note (Signed)
Addendum  created 09/26/16 1714 by Annye Asa, MD   Sign clinical note

## 2016-09-27 DIAGNOSIS — E039 Hypothyroidism, unspecified: Secondary | ICD-10-CM | POA: Diagnosis not present

## 2016-09-27 DIAGNOSIS — M3131 Wegener's granulomatosis with renal involvement: Secondary | ICD-10-CM | POA: Diagnosis not present

## 2016-09-27 DIAGNOSIS — R319 Hematuria, unspecified: Secondary | ICD-10-CM | POA: Diagnosis not present

## 2016-09-27 DIAGNOSIS — E7800 Pure hypercholesterolemia, unspecified: Secondary | ICD-10-CM | POA: Diagnosis not present

## 2016-09-27 DIAGNOSIS — R7301 Impaired fasting glucose: Secondary | ICD-10-CM | POA: Diagnosis not present

## 2016-10-18 DIAGNOSIS — E782 Mixed hyperlipidemia: Secondary | ICD-10-CM | POA: Diagnosis not present

## 2016-10-18 DIAGNOSIS — E039 Hypothyroidism, unspecified: Secondary | ICD-10-CM | POA: Diagnosis not present

## 2016-10-18 DIAGNOSIS — N059 Unspecified nephritic syndrome with unspecified morphologic changes: Secondary | ICD-10-CM | POA: Diagnosis not present

## 2016-10-25 DIAGNOSIS — D631 Anemia in chronic kidney disease: Secondary | ICD-10-CM | POA: Diagnosis not present

## 2016-10-25 DIAGNOSIS — E785 Hyperlipidemia, unspecified: Secondary | ICD-10-CM | POA: Diagnosis not present

## 2016-10-25 DIAGNOSIS — Z6829 Body mass index (BMI) 29.0-29.9, adult: Secondary | ICD-10-CM | POA: Diagnosis not present

## 2016-10-25 DIAGNOSIS — I776 Arteritis, unspecified: Secondary | ICD-10-CM | POA: Diagnosis not present

## 2016-10-25 DIAGNOSIS — M3130 Wegener's granulomatosis without renal involvement: Secondary | ICD-10-CM | POA: Diagnosis not present

## 2016-10-25 DIAGNOSIS — N179 Acute kidney failure, unspecified: Secondary | ICD-10-CM | POA: Diagnosis not present

## 2016-10-25 DIAGNOSIS — M3131 Wegener's granulomatosis with renal involvement: Secondary | ICD-10-CM | POA: Diagnosis not present

## 2016-10-25 DIAGNOSIS — N184 Chronic kidney disease, stage 4 (severe): Secondary | ICD-10-CM | POA: Diagnosis not present

## 2016-11-03 DIAGNOSIS — N184 Chronic kidney disease, stage 4 (severe): Secondary | ICD-10-CM | POA: Diagnosis not present

## 2016-11-16 DIAGNOSIS — E039 Hypothyroidism, unspecified: Secondary | ICD-10-CM | POA: Diagnosis not present

## 2016-12-05 DIAGNOSIS — N184 Chronic kidney disease, stage 4 (severe): Secondary | ICD-10-CM | POA: Diagnosis not present

## 2016-12-06 DIAGNOSIS — E785 Hyperlipidemia, unspecified: Secondary | ICD-10-CM | POA: Diagnosis not present

## 2016-12-06 DIAGNOSIS — Z6830 Body mass index (BMI) 30.0-30.9, adult: Secondary | ICD-10-CM | POA: Diagnosis not present

## 2016-12-06 DIAGNOSIS — M3131 Wegener's granulomatosis with renal involvement: Secondary | ICD-10-CM | POA: Diagnosis not present

## 2016-12-06 DIAGNOSIS — D631 Anemia in chronic kidney disease: Secondary | ICD-10-CM | POA: Diagnosis not present

## 2016-12-06 DIAGNOSIS — N179 Acute kidney failure, unspecified: Secondary | ICD-10-CM | POA: Diagnosis not present

## 2016-12-06 DIAGNOSIS — N184 Chronic kidney disease, stage 4 (severe): Secondary | ICD-10-CM | POA: Diagnosis not present

## 2016-12-19 DIAGNOSIS — E119 Type 2 diabetes mellitus without complications: Secondary | ICD-10-CM | POA: Diagnosis not present

## 2016-12-19 DIAGNOSIS — H2513 Age-related nuclear cataract, bilateral: Secondary | ICD-10-CM | POA: Diagnosis not present

## 2016-12-26 DIAGNOSIS — Z23 Encounter for immunization: Secondary | ICD-10-CM | POA: Diagnosis not present

## 2017-01-24 DIAGNOSIS — M3131 Wegener's granulomatosis with renal involvement: Secondary | ICD-10-CM | POA: Diagnosis not present

## 2017-01-24 DIAGNOSIS — E785 Hyperlipidemia, unspecified: Secondary | ICD-10-CM | POA: Diagnosis not present

## 2017-01-24 DIAGNOSIS — M3130 Wegener's granulomatosis without renal involvement: Secondary | ICD-10-CM | POA: Diagnosis not present

## 2017-01-24 DIAGNOSIS — N179 Acute kidney failure, unspecified: Secondary | ICD-10-CM | POA: Diagnosis not present

## 2017-01-24 DIAGNOSIS — D631 Anemia in chronic kidney disease: Secondary | ICD-10-CM | POA: Diagnosis not present

## 2017-01-24 DIAGNOSIS — N184 Chronic kidney disease, stage 4 (severe): Secondary | ICD-10-CM | POA: Diagnosis not present

## 2017-01-31 DIAGNOSIS — R972 Elevated prostate specific antigen [PSA]: Secondary | ICD-10-CM | POA: Diagnosis not present

## 2017-01-31 DIAGNOSIS — Z23 Encounter for immunization: Secondary | ICD-10-CM | POA: Diagnosis not present

## 2017-01-31 DIAGNOSIS — I776 Arteritis, unspecified: Secondary | ICD-10-CM | POA: Diagnosis not present

## 2017-01-31 DIAGNOSIS — N138 Other obstructive and reflux uropathy: Secondary | ICD-10-CM | POA: Diagnosis not present

## 2017-01-31 DIAGNOSIS — N401 Enlarged prostate with lower urinary tract symptoms: Secondary | ICD-10-CM | POA: Diagnosis not present

## 2017-01-31 DIAGNOSIS — N400 Enlarged prostate without lower urinary tract symptoms: Secondary | ICD-10-CM | POA: Diagnosis not present

## 2017-02-21 DIAGNOSIS — M3131 Wegener's granulomatosis with renal involvement: Secondary | ICD-10-CM | POA: Diagnosis not present

## 2017-02-21 DIAGNOSIS — E785 Hyperlipidemia, unspecified: Secondary | ICD-10-CM | POA: Diagnosis not present

## 2017-02-21 DIAGNOSIS — N184 Chronic kidney disease, stage 4 (severe): Secondary | ICD-10-CM | POA: Diagnosis not present

## 2017-02-21 DIAGNOSIS — M3130 Wegener's granulomatosis without renal involvement: Secondary | ICD-10-CM | POA: Diagnosis not present

## 2017-02-21 DIAGNOSIS — D631 Anemia in chronic kidney disease: Secondary | ICD-10-CM | POA: Diagnosis not present

## 2017-02-21 DIAGNOSIS — N179 Acute kidney failure, unspecified: Secondary | ICD-10-CM | POA: Diagnosis not present

## 2017-03-15 DIAGNOSIS — J0100 Acute maxillary sinusitis, unspecified: Secondary | ICD-10-CM | POA: Diagnosis not present

## 2017-03-28 DIAGNOSIS — I776 Arteritis, unspecified: Secondary | ICD-10-CM | POA: Diagnosis not present

## 2017-03-28 DIAGNOSIS — Z9221 Personal history of antineoplastic chemotherapy: Secondary | ICD-10-CM | POA: Diagnosis not present

## 2017-03-28 DIAGNOSIS — N138 Other obstructive and reflux uropathy: Secondary | ICD-10-CM | POA: Diagnosis not present

## 2017-03-28 DIAGNOSIS — N401 Enlarged prostate with lower urinary tract symptoms: Secondary | ICD-10-CM | POA: Diagnosis not present

## 2017-04-04 DIAGNOSIS — M3131 Wegener's granulomatosis with renal involvement: Secondary | ICD-10-CM | POA: Diagnosis not present

## 2017-04-04 DIAGNOSIS — M3130 Wegener's granulomatosis without renal involvement: Secondary | ICD-10-CM | POA: Diagnosis not present

## 2017-04-04 DIAGNOSIS — E785 Hyperlipidemia, unspecified: Secondary | ICD-10-CM | POA: Diagnosis not present

## 2017-04-04 DIAGNOSIS — N184 Chronic kidney disease, stage 4 (severe): Secondary | ICD-10-CM | POA: Diagnosis not present

## 2017-04-04 DIAGNOSIS — Z6830 Body mass index (BMI) 30.0-30.9, adult: Secondary | ICD-10-CM | POA: Diagnosis not present

## 2017-04-04 DIAGNOSIS — D631 Anemia in chronic kidney disease: Secondary | ICD-10-CM | POA: Diagnosis not present

## 2017-04-04 DIAGNOSIS — N179 Acute kidney failure, unspecified: Secondary | ICD-10-CM | POA: Diagnosis not present

## 2017-04-24 DIAGNOSIS — K260 Acute duodenal ulcer with hemorrhage: Secondary | ICD-10-CM | POA: Diagnosis not present

## 2017-04-24 DIAGNOSIS — M3131 Wegener's granulomatosis with renal involvement: Secondary | ICD-10-CM | POA: Diagnosis not present

## 2017-05-18 DIAGNOSIS — K260 Acute duodenal ulcer with hemorrhage: Secondary | ICD-10-CM | POA: Diagnosis not present

## 2017-05-31 ENCOUNTER — Other Ambulatory Visit: Payer: Self-pay | Admitting: Nephrology

## 2017-05-31 DIAGNOSIS — N184 Chronic kidney disease, stage 4 (severe): Secondary | ICD-10-CM

## 2017-05-31 DIAGNOSIS — D631 Anemia in chronic kidney disease: Secondary | ICD-10-CM | POA: Diagnosis not present

## 2017-05-31 DIAGNOSIS — M3131 Wegener's granulomatosis with renal involvement: Secondary | ICD-10-CM | POA: Diagnosis not present

## 2017-05-31 DIAGNOSIS — N179 Acute kidney failure, unspecified: Secondary | ICD-10-CM | POA: Diagnosis not present

## 2017-05-31 DIAGNOSIS — Z6830 Body mass index (BMI) 30.0-30.9, adult: Secondary | ICD-10-CM | POA: Diagnosis not present

## 2017-05-31 DIAGNOSIS — M3130 Wegener's granulomatosis without renal involvement: Secondary | ICD-10-CM | POA: Diagnosis not present

## 2017-05-31 DIAGNOSIS — E785 Hyperlipidemia, unspecified: Secondary | ICD-10-CM | POA: Diagnosis not present

## 2017-06-05 ENCOUNTER — Ambulatory Visit
Admission: RE | Admit: 2017-06-05 | Discharge: 2017-06-05 | Disposition: A | Discharge status: Home / Self Care | Payer: Medicare Other | Source: Ambulatory Visit | Attending: Nephrology | Admitting: Nephrology

## 2017-06-05 DIAGNOSIS — N184 Chronic kidney disease, stage 4 (severe): Secondary | ICD-10-CM | POA: Diagnosis not present

## 2017-06-22 DIAGNOSIS — Z0000 Encounter for general adult medical examination without abnormal findings: Secondary | ICD-10-CM | POA: Diagnosis not present

## 2017-06-26 DIAGNOSIS — K269 Duodenal ulcer, unspecified as acute or chronic, without hemorrhage or perforation: Secondary | ICD-10-CM | POA: Diagnosis not present

## 2017-06-26 DIAGNOSIS — Z7984 Long term (current) use of oral hypoglycemic drugs: Secondary | ICD-10-CM | POA: Diagnosis not present

## 2017-06-26 DIAGNOSIS — I776 Arteritis, unspecified: Secondary | ICD-10-CM | POA: Diagnosis not present

## 2017-06-26 DIAGNOSIS — R7301 Impaired fasting glucose: Secondary | ICD-10-CM | POA: Diagnosis not present

## 2017-06-26 DIAGNOSIS — E7800 Pure hypercholesterolemia, unspecified: Secondary | ICD-10-CM | POA: Diagnosis not present

## 2017-06-26 DIAGNOSIS — Z1389 Encounter for screening for other disorder: Secondary | ICD-10-CM | POA: Diagnosis not present

## 2017-06-26 DIAGNOSIS — N644 Mastodynia: Secondary | ICD-10-CM | POA: Diagnosis not present

## 2017-06-26 DIAGNOSIS — M109 Gout, unspecified: Secondary | ICD-10-CM | POA: Diagnosis not present

## 2017-06-26 DIAGNOSIS — E039 Hypothyroidism, unspecified: Secondary | ICD-10-CM | POA: Diagnosis not present

## 2017-06-26 DIAGNOSIS — Z0000 Encounter for general adult medical examination without abnormal findings: Secondary | ICD-10-CM | POA: Diagnosis not present

## 2017-06-26 DIAGNOSIS — E663 Overweight: Secondary | ICD-10-CM | POA: Diagnosis not present

## 2017-06-27 DIAGNOSIS — N184 Chronic kidney disease, stage 4 (severe): Secondary | ICD-10-CM | POA: Diagnosis not present

## 2017-06-27 DIAGNOSIS — E785 Hyperlipidemia, unspecified: Secondary | ICD-10-CM | POA: Diagnosis not present

## 2017-06-27 DIAGNOSIS — Z6830 Body mass index (BMI) 30.0-30.9, adult: Secondary | ICD-10-CM | POA: Diagnosis not present

## 2017-06-27 DIAGNOSIS — D631 Anemia in chronic kidney disease: Secondary | ICD-10-CM | POA: Diagnosis not present

## 2017-06-27 DIAGNOSIS — M3130 Wegener's granulomatosis without renal involvement: Secondary | ICD-10-CM | POA: Diagnosis not present

## 2017-06-27 DIAGNOSIS — N179 Acute kidney failure, unspecified: Secondary | ICD-10-CM | POA: Diagnosis not present

## 2017-06-27 DIAGNOSIS — M3131 Wegener's granulomatosis with renal involvement: Secondary | ICD-10-CM | POA: Diagnosis not present

## 2017-07-17 DIAGNOSIS — G4733 Obstructive sleep apnea (adult) (pediatric): Secondary | ICD-10-CM | POA: Diagnosis not present

## 2017-07-24 DIAGNOSIS — J209 Acute bronchitis, unspecified: Secondary | ICD-10-CM | POA: Diagnosis not present

## 2017-07-27 DIAGNOSIS — N184 Chronic kidney disease, stage 4 (severe): Secondary | ICD-10-CM | POA: Diagnosis not present

## 2017-08-02 DIAGNOSIS — E785 Hyperlipidemia, unspecified: Secondary | ICD-10-CM | POA: Diagnosis not present

## 2017-08-02 DIAGNOSIS — D631 Anemia in chronic kidney disease: Secondary | ICD-10-CM | POA: Diagnosis not present

## 2017-08-02 DIAGNOSIS — N179 Acute kidney failure, unspecified: Secondary | ICD-10-CM | POA: Diagnosis not present

## 2017-08-02 DIAGNOSIS — Z6830 Body mass index (BMI) 30.0-30.9, adult: Secondary | ICD-10-CM | POA: Diagnosis not present

## 2017-08-02 DIAGNOSIS — K219 Gastro-esophageal reflux disease without esophagitis: Secondary | ICD-10-CM | POA: Diagnosis not present

## 2017-08-02 DIAGNOSIS — I129 Hypertensive chronic kidney disease with stage 1 through stage 4 chronic kidney disease, or unspecified chronic kidney disease: Secondary | ICD-10-CM | POA: Diagnosis not present

## 2017-08-02 DIAGNOSIS — M3131 Wegener's granulomatosis with renal involvement: Secondary | ICD-10-CM | POA: Diagnosis not present

## 2017-08-02 DIAGNOSIS — N183 Chronic kidney disease, stage 3 (moderate): Secondary | ICD-10-CM | POA: Diagnosis not present

## 2017-08-25 DIAGNOSIS — E039 Hypothyroidism, unspecified: Secondary | ICD-10-CM | POA: Diagnosis not present

## 2017-09-11 DIAGNOSIS — M3131 Wegener's granulomatosis with renal involvement: Secondary | ICD-10-CM | POA: Diagnosis not present

## 2017-09-11 DIAGNOSIS — I129 Hypertensive chronic kidney disease with stage 1 through stage 4 chronic kidney disease, or unspecified chronic kidney disease: Secondary | ICD-10-CM | POA: Diagnosis not present

## 2017-09-11 DIAGNOSIS — E785 Hyperlipidemia, unspecified: Secondary | ICD-10-CM | POA: Diagnosis not present

## 2017-09-11 DIAGNOSIS — K219 Gastro-esophageal reflux disease without esophagitis: Secondary | ICD-10-CM | POA: Diagnosis not present

## 2017-09-11 DIAGNOSIS — D631 Anemia in chronic kidney disease: Secondary | ICD-10-CM | POA: Diagnosis not present

## 2017-09-11 DIAGNOSIS — N179 Acute kidney failure, unspecified: Secondary | ICD-10-CM | POA: Diagnosis not present

## 2017-09-11 DIAGNOSIS — Z6830 Body mass index (BMI) 30.0-30.9, adult: Secondary | ICD-10-CM | POA: Diagnosis not present

## 2017-09-11 DIAGNOSIS — N183 Chronic kidney disease, stage 3 (moderate): Secondary | ICD-10-CM | POA: Diagnosis not present

## 2017-12-12 DIAGNOSIS — K219 Gastro-esophageal reflux disease without esophagitis: Secondary | ICD-10-CM | POA: Diagnosis not present

## 2017-12-12 DIAGNOSIS — N179 Acute kidney failure, unspecified: Secondary | ICD-10-CM | POA: Diagnosis not present

## 2017-12-12 DIAGNOSIS — N2581 Secondary hyperparathyroidism of renal origin: Secondary | ICD-10-CM | POA: Diagnosis not present

## 2017-12-12 DIAGNOSIS — Z6830 Body mass index (BMI) 30.0-30.9, adult: Secondary | ICD-10-CM | POA: Diagnosis not present

## 2017-12-12 DIAGNOSIS — E785 Hyperlipidemia, unspecified: Secondary | ICD-10-CM | POA: Diagnosis not present

## 2017-12-12 DIAGNOSIS — D631 Anemia in chronic kidney disease: Secondary | ICD-10-CM | POA: Diagnosis not present

## 2017-12-12 DIAGNOSIS — I129 Hypertensive chronic kidney disease with stage 1 through stage 4 chronic kidney disease, or unspecified chronic kidney disease: Secondary | ICD-10-CM | POA: Diagnosis not present

## 2017-12-12 DIAGNOSIS — N183 Chronic kidney disease, stage 3 (moderate): Secondary | ICD-10-CM | POA: Diagnosis not present

## 2017-12-12 DIAGNOSIS — M3131 Wegener's granulomatosis with renal involvement: Secondary | ICD-10-CM | POA: Diagnosis not present

## 2017-12-12 DIAGNOSIS — N189 Chronic kidney disease, unspecified: Secondary | ICD-10-CM | POA: Diagnosis not present

## 2017-12-21 DIAGNOSIS — H524 Presbyopia: Secondary | ICD-10-CM | POA: Diagnosis not present

## 2017-12-21 DIAGNOSIS — H5212 Myopia, left eye: Secondary | ICD-10-CM | POA: Diagnosis not present

## 2017-12-21 DIAGNOSIS — H52203 Unspecified astigmatism, bilateral: Secondary | ICD-10-CM | POA: Diagnosis not present

## 2017-12-21 DIAGNOSIS — H5201 Hypermetropia, right eye: Secondary | ICD-10-CM | POA: Diagnosis not present

## 2017-12-21 DIAGNOSIS — H2513 Age-related nuclear cataract, bilateral: Secondary | ICD-10-CM | POA: Diagnosis not present

## 2017-12-27 DIAGNOSIS — M109 Gout, unspecified: Secondary | ICD-10-CM | POA: Diagnosis not present

## 2017-12-27 DIAGNOSIS — E039 Hypothyroidism, unspecified: Secondary | ICD-10-CM | POA: Diagnosis not present

## 2017-12-27 DIAGNOSIS — E7800 Pure hypercholesterolemia, unspecified: Secondary | ICD-10-CM | POA: Diagnosis not present

## 2017-12-27 DIAGNOSIS — E663 Overweight: Secondary | ICD-10-CM | POA: Diagnosis not present

## 2017-12-27 DIAGNOSIS — Z23 Encounter for immunization: Secondary | ICD-10-CM | POA: Diagnosis not present

## 2017-12-27 DIAGNOSIS — M3131 Wegener's granulomatosis with renal involvement: Secondary | ICD-10-CM | POA: Diagnosis not present

## 2017-12-27 DIAGNOSIS — G4733 Obstructive sleep apnea (adult) (pediatric): Secondary | ICD-10-CM | POA: Diagnosis not present

## 2018-01-29 DIAGNOSIS — R05 Cough: Secondary | ICD-10-CM | POA: Diagnosis not present

## 2018-01-29 DIAGNOSIS — J069 Acute upper respiratory infection, unspecified: Secondary | ICD-10-CM | POA: Diagnosis not present

## 2018-02-06 DIAGNOSIS — R972 Elevated prostate specific antigen [PSA]: Secondary | ICD-10-CM | POA: Diagnosis not present

## 2018-02-06 DIAGNOSIS — N400 Enlarged prostate without lower urinary tract symptoms: Secondary | ICD-10-CM | POA: Diagnosis not present

## 2018-02-06 DIAGNOSIS — N138 Other obstructive and reflux uropathy: Secondary | ICD-10-CM | POA: Diagnosis not present

## 2018-02-06 DIAGNOSIS — N401 Enlarged prostate with lower urinary tract symptoms: Secondary | ICD-10-CM | POA: Diagnosis not present

## 2018-02-07 DIAGNOSIS — E039 Hypothyroidism, unspecified: Secondary | ICD-10-CM | POA: Diagnosis not present

## 2018-04-10 DIAGNOSIS — K219 Gastro-esophageal reflux disease without esophagitis: Secondary | ICD-10-CM | POA: Diagnosis not present

## 2018-04-10 DIAGNOSIS — Z6830 Body mass index (BMI) 30.0-30.9, adult: Secondary | ICD-10-CM | POA: Diagnosis not present

## 2018-04-10 DIAGNOSIS — M3131 Wegener's granulomatosis with renal involvement: Secondary | ICD-10-CM | POA: Diagnosis not present

## 2018-04-10 DIAGNOSIS — N2581 Secondary hyperparathyroidism of renal origin: Secondary | ICD-10-CM | POA: Diagnosis not present

## 2018-04-10 DIAGNOSIS — N183 Chronic kidney disease, stage 3 (moderate): Secondary | ICD-10-CM | POA: Diagnosis not present

## 2018-04-10 DIAGNOSIS — I129 Hypertensive chronic kidney disease with stage 1 through stage 4 chronic kidney disease, or unspecified chronic kidney disease: Secondary | ICD-10-CM | POA: Diagnosis not present

## 2018-04-10 DIAGNOSIS — N189 Chronic kidney disease, unspecified: Secondary | ICD-10-CM | POA: Diagnosis not present

## 2018-04-10 DIAGNOSIS — D631 Anemia in chronic kidney disease: Secondary | ICD-10-CM | POA: Diagnosis not present

## 2018-04-10 DIAGNOSIS — E785 Hyperlipidemia, unspecified: Secondary | ICD-10-CM | POA: Diagnosis not present

## 2018-04-10 DIAGNOSIS — N179 Acute kidney failure, unspecified: Secondary | ICD-10-CM | POA: Diagnosis not present

## 2018-05-09 DIAGNOSIS — H2513 Age-related nuclear cataract, bilateral: Secondary | ICD-10-CM | POA: Diagnosis not present

## 2018-05-09 DIAGNOSIS — H25013 Cortical age-related cataract, bilateral: Secondary | ICD-10-CM | POA: Diagnosis not present

## 2018-05-23 DIAGNOSIS — H25012 Cortical age-related cataract, left eye: Secondary | ICD-10-CM | POA: Diagnosis not present

## 2018-05-23 DIAGNOSIS — H2511 Age-related nuclear cataract, right eye: Secondary | ICD-10-CM | POA: Diagnosis not present

## 2018-05-23 DIAGNOSIS — H2512 Age-related nuclear cataract, left eye: Secondary | ICD-10-CM | POA: Diagnosis not present

## 2018-05-23 DIAGNOSIS — H25011 Cortical age-related cataract, right eye: Secondary | ICD-10-CM | POA: Diagnosis not present

## 2018-05-28 IMAGING — MR MR 3D RECON AT SCANNER
12 series · 16 of 16 positions shown · non-contrast
Comparison: 04/17/2016

CLINICAL DATA: Possible pancreatic mass on ultrasound.

EXAM:
MRI ABDOMEN WITHOUT CONTRAST  (INCLUDING MRCP)
TECHNIQUE: Multiplanar multisequence MR imaging of the abdomen was performed.
Heavily T2-weighted images of the biliary and pancreatic ducts were
obtained, and three-dimensional MRCP images were rendered by post
processing.
IV contrast could not be given due to renal failure.

[Series 3: T2 fat-sat · axial · 5.0mm · 0.94mm/px · 1 of 56 slices shown]
[im 1/56]
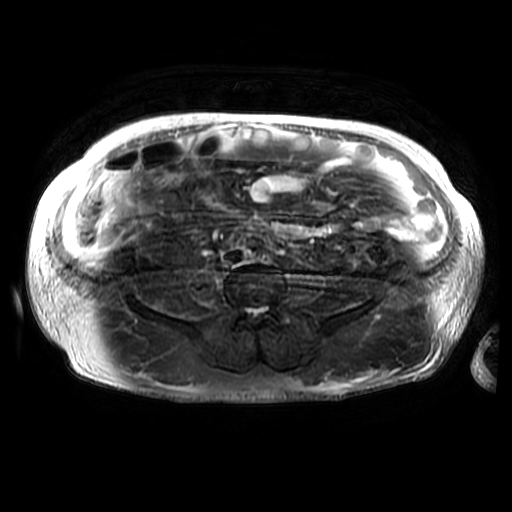

[Series 4: MRCP · coronal · 2.2mm · 0.62mm/px · 1 of 117 slices shown (1 of 4)]
[im 1/117]
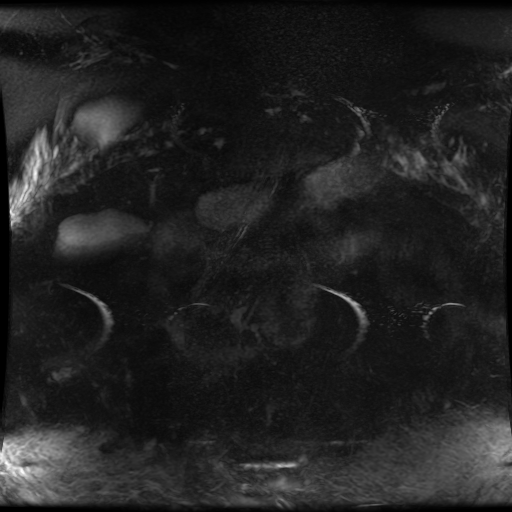

[Series 5: MRCP · coronal · 2.0mm · 0.70mm/px · 1 of 66 slices shown (2 of 4)]
[im 1/66]
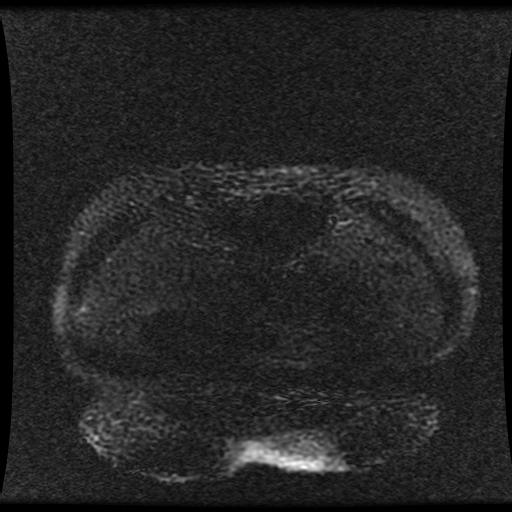

[Series 6: MRCP · coronal · 2.2mm · 0.62mm/px · 1 of 120 slices shown (3 of 4)]
[im 1/120]
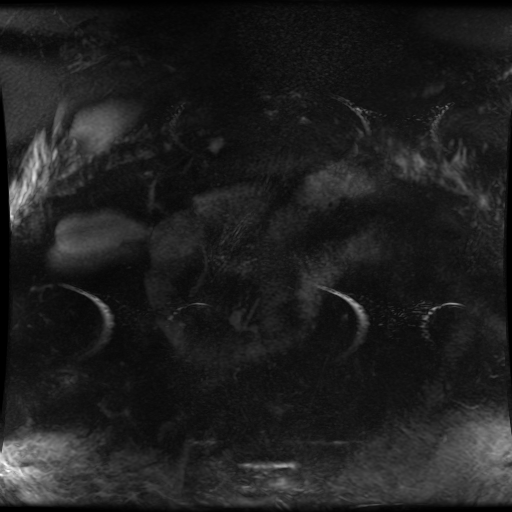

[Series 7: DWI b500 · axial · 6.0mm · 1.88mm/px · 1 of 74 slices shown]
[im 1/74]
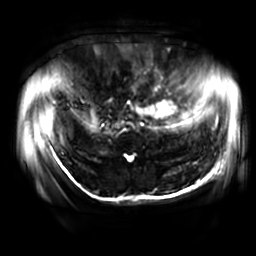

[Series 8: ax dualecho · axial · 5.0mm · 0.94mm/px · 1 of 112 slices shown]
[im 1/112]
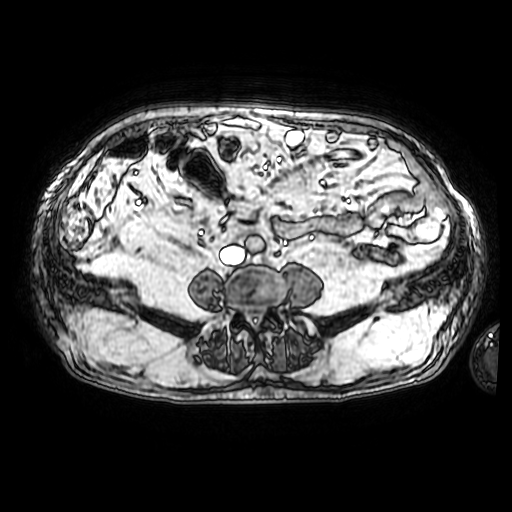

[Series 9: MRCP · coronal · 40.0mm · 0.70mm/px · 1 of 9 slices shown (4 of 4)]
[im 1/9]
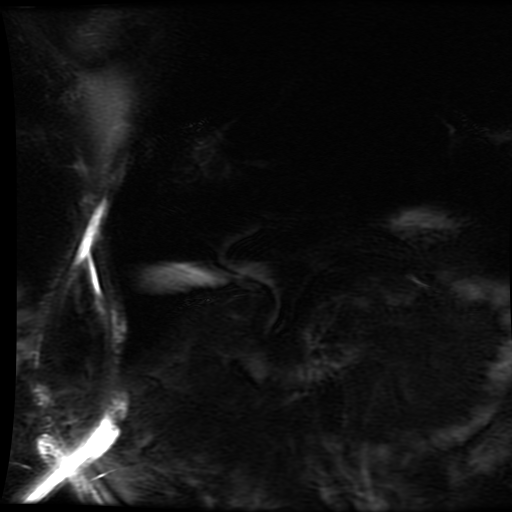

[Series 10: bSSFP fat-sat · coronal · 5.0mm · 0.90mm/px · 1 of 59 slices shown]
[im 1/59]
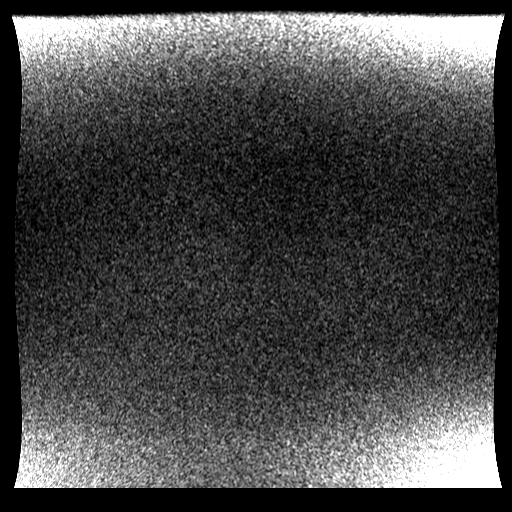

[Series 12: T2 · axial · 5.0mm · 0.94mm/px · 1 of 56 slices shown]
[im 1/56]
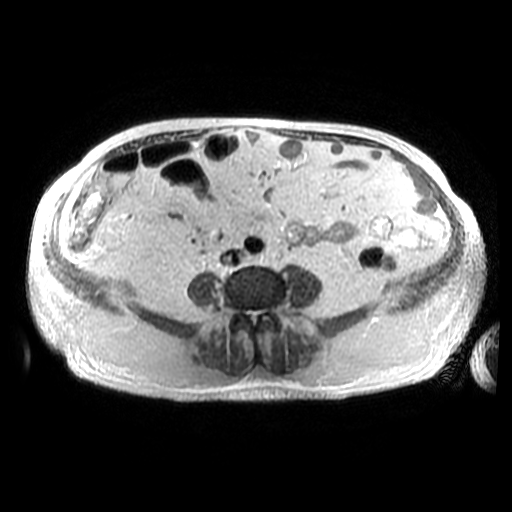

[Series 600: reformatted · axial · 2.2mm · 0.62mm/px · z∈[+50,+180]mm · 3 of 184 slices shown (1 of 2)]
[im 1/184]
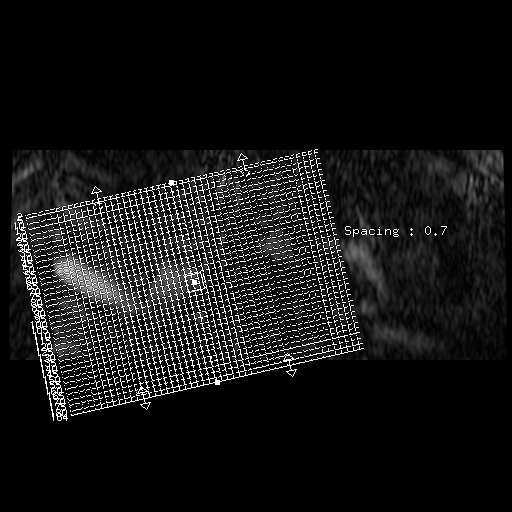
[im 92/184]
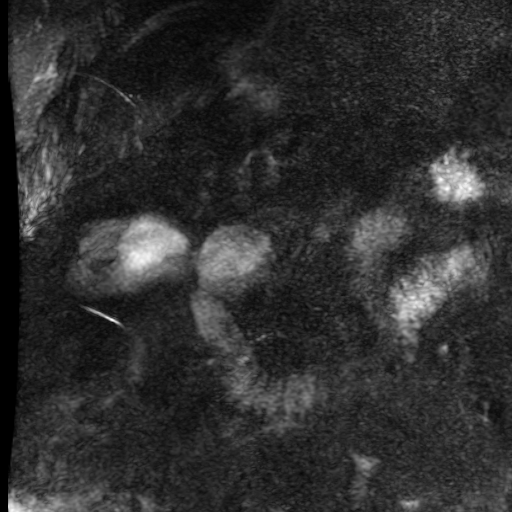
[im 184/184]
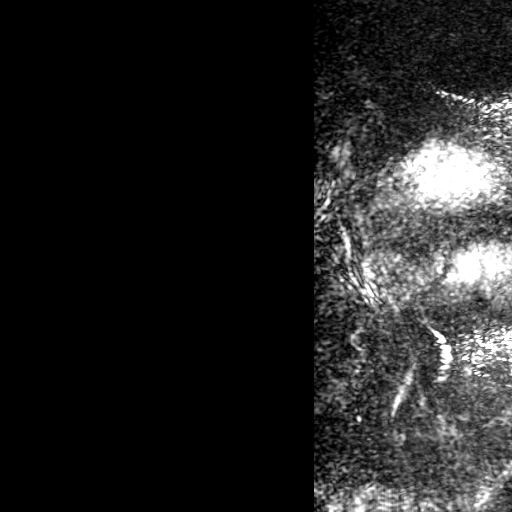

[Series 601: reformatted · axial · 2.2mm · 0.62mm/px · z∈[+50,+180]mm · 3 of 176 slices shown (2 of 2)]
[im 1/176]
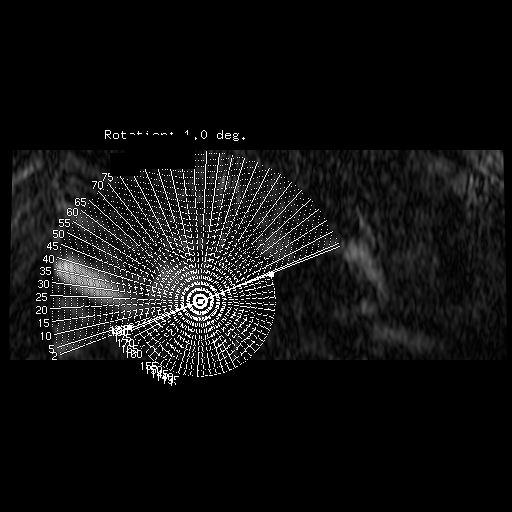
[im 88/176]
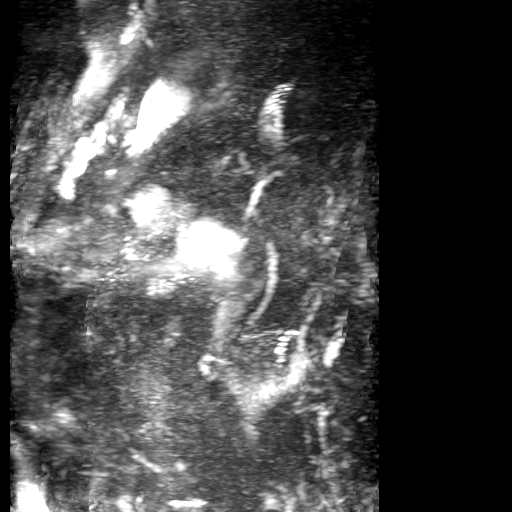
[im 176/176]
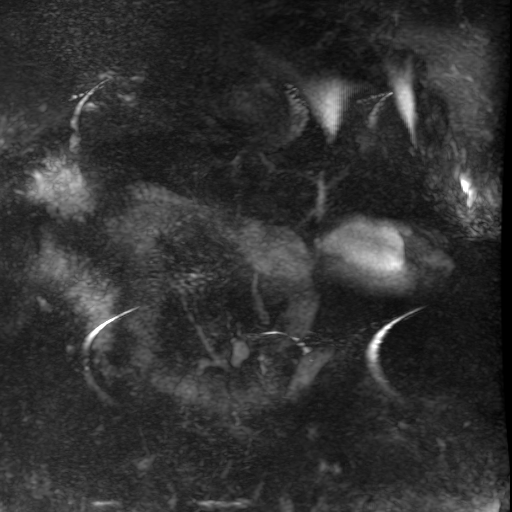

[Series 700: DWI · axial · 6.0mm · 1.88mm/px · 1 of 37 slices shown]
[im 1/37]
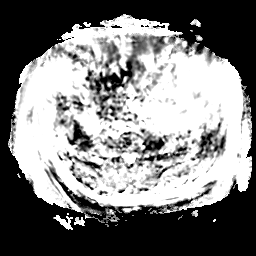

[16 of 16 positions shown; findings below may reference images not displayed]

FINDINGS: Despite efforts by the technologist and patient, motion artifact is
present on today's exam and could not be eliminated. This reduces
exam sensitivity and specificity.

Lower chest: Suspected mild atelectasis in the lower lobes.

Hepatobiliary: Contracted gallbladder. Motion artifact precludes
sensitive assessment of the biliary tree due to its severity, but
there is no biliary dilatation.

Pancreas: In the area of concern along the dorsal pancreas at the
junction of the pancreatic body and tail, no abnormality is
observed. The pancreas appears normal on MRI.

Spleen:  Unremarkable

Adrenals/Urinary Tract:  Unremarkable

Stomach/Bowel: Unremarkable

Vascular/Lymphatic:  Unremarkable

Other:  No supplemental non-categorized findings.

Musculoskeletal: Unremarkable
IMPRESSION: 1. In the area of concern on ultrasound there is no abnormality on
today' s noncontrast MRI. I suspect that the region was spurious or
due to adjacent bowel.
2. Mild bibasilar atelectasis.
3. There is fairly severe motion artifact which precludes sensitive
assessment of the biliary tree, but no biliary dilatation is
observed.

## 2018-06-28 DIAGNOSIS — G4733 Obstructive sleep apnea (adult) (pediatric): Secondary | ICD-10-CM | POA: Diagnosis not present

## 2018-06-28 DIAGNOSIS — G5793 Unspecified mononeuropathy of bilateral lower limbs: Secondary | ICD-10-CM | POA: Diagnosis not present

## 2018-06-28 DIAGNOSIS — I776 Arteritis, unspecified: Secondary | ICD-10-CM | POA: Diagnosis not present

## 2018-06-28 DIAGNOSIS — Z0000 Encounter for general adult medical examination without abnormal findings: Secondary | ICD-10-CM | POA: Diagnosis not present

## 2018-06-28 DIAGNOSIS — R7301 Impaired fasting glucose: Secondary | ICD-10-CM | POA: Diagnosis not present

## 2018-06-28 DIAGNOSIS — N183 Chronic kidney disease, stage 3 (moderate): Secondary | ICD-10-CM | POA: Diagnosis not present

## 2018-06-28 DIAGNOSIS — E7800 Pure hypercholesterolemia, unspecified: Secondary | ICD-10-CM | POA: Diagnosis not present

## 2018-06-28 DIAGNOSIS — Z1389 Encounter for screening for other disorder: Secondary | ICD-10-CM | POA: Diagnosis not present

## 2018-06-28 DIAGNOSIS — E039 Hypothyroidism, unspecified: Secondary | ICD-10-CM | POA: Diagnosis not present

## 2018-06-28 DIAGNOSIS — R7309 Other abnormal glucose: Secondary | ICD-10-CM | POA: Diagnosis not present

## 2018-07-11 DIAGNOSIS — I129 Hypertensive chronic kidney disease with stage 1 through stage 4 chronic kidney disease, or unspecified chronic kidney disease: Secondary | ICD-10-CM | POA: Diagnosis not present

## 2018-07-11 DIAGNOSIS — M3131 Wegener's granulomatosis with renal involvement: Secondary | ICD-10-CM | POA: Diagnosis not present

## 2018-07-11 DIAGNOSIS — D631 Anemia in chronic kidney disease: Secondary | ICD-10-CM | POA: Diagnosis not present

## 2018-07-11 DIAGNOSIS — E785 Hyperlipidemia, unspecified: Secondary | ICD-10-CM | POA: Diagnosis not present

## 2018-07-11 DIAGNOSIS — N2581 Secondary hyperparathyroidism of renal origin: Secondary | ICD-10-CM | POA: Diagnosis not present

## 2018-07-11 DIAGNOSIS — N183 Chronic kidney disease, stage 3 (moderate): Secondary | ICD-10-CM | POA: Diagnosis not present

## 2018-07-11 DIAGNOSIS — N189 Chronic kidney disease, unspecified: Secondary | ICD-10-CM | POA: Diagnosis not present

## 2018-07-11 DIAGNOSIS — K219 Gastro-esophageal reflux disease without esophagitis: Secondary | ICD-10-CM | POA: Diagnosis not present

## 2018-07-11 DIAGNOSIS — N179 Acute kidney failure, unspecified: Secondary | ICD-10-CM | POA: Diagnosis not present

## 2018-07-11 DIAGNOSIS — Z6830 Body mass index (BMI) 30.0-30.9, adult: Secondary | ICD-10-CM | POA: Diagnosis not present

## 2018-08-15 DIAGNOSIS — N059 Unspecified nephritic syndrome with unspecified morphologic changes: Secondary | ICD-10-CM | POA: Diagnosis not present

## 2018-08-15 DIAGNOSIS — Z8719 Personal history of other diseases of the digestive system: Secondary | ICD-10-CM | POA: Diagnosis not present

## 2018-08-15 DIAGNOSIS — G4730 Sleep apnea, unspecified: Secondary | ICD-10-CM | POA: Diagnosis not present

## 2018-08-15 DIAGNOSIS — M3131 Wegener's granulomatosis with renal involvement: Secondary | ICD-10-CM | POA: Diagnosis not present

## 2018-08-23 DIAGNOSIS — D229 Melanocytic nevi, unspecified: Secondary | ICD-10-CM | POA: Diagnosis not present

## 2018-08-23 DIAGNOSIS — L814 Other melanin hyperpigmentation: Secondary | ICD-10-CM | POA: Diagnosis not present

## 2018-08-23 DIAGNOSIS — L821 Other seborrheic keratosis: Secondary | ICD-10-CM | POA: Diagnosis not present

## 2018-08-23 DIAGNOSIS — D1801 Hemangioma of skin and subcutaneous tissue: Secondary | ICD-10-CM | POA: Diagnosis not present

## 2018-08-23 DIAGNOSIS — L570 Actinic keratosis: Secondary | ICD-10-CM | POA: Diagnosis not present

## 2018-08-23 DIAGNOSIS — Z85820 Personal history of malignant melanoma of skin: Secondary | ICD-10-CM | POA: Diagnosis not present

## 2018-08-29 DIAGNOSIS — H25012 Cortical age-related cataract, left eye: Secondary | ICD-10-CM | POA: Diagnosis not present

## 2018-08-29 DIAGNOSIS — H2512 Age-related nuclear cataract, left eye: Secondary | ICD-10-CM | POA: Diagnosis not present

## 2018-10-12 DIAGNOSIS — E785 Hyperlipidemia, unspecified: Secondary | ICD-10-CM | POA: Diagnosis not present

## 2018-10-12 DIAGNOSIS — I129 Hypertensive chronic kidney disease with stage 1 through stage 4 chronic kidney disease, or unspecified chronic kidney disease: Secondary | ICD-10-CM | POA: Diagnosis not present

## 2018-10-12 DIAGNOSIS — N179 Acute kidney failure, unspecified: Secondary | ICD-10-CM | POA: Diagnosis not present

## 2018-10-12 DIAGNOSIS — M3131 Wegener's granulomatosis with renal involvement: Secondary | ICD-10-CM | POA: Diagnosis not present

## 2018-10-12 DIAGNOSIS — K219 Gastro-esophageal reflux disease without esophagitis: Secondary | ICD-10-CM | POA: Diagnosis not present

## 2018-10-12 DIAGNOSIS — Z6830 Body mass index (BMI) 30.0-30.9, adult: Secondary | ICD-10-CM | POA: Diagnosis not present

## 2018-10-12 DIAGNOSIS — D631 Anemia in chronic kidney disease: Secondary | ICD-10-CM | POA: Diagnosis not present

## 2018-10-12 DIAGNOSIS — N183 Chronic kidney disease, stage 3 (moderate): Secondary | ICD-10-CM | POA: Diagnosis not present

## 2018-10-12 DIAGNOSIS — N189 Chronic kidney disease, unspecified: Secondary | ICD-10-CM | POA: Diagnosis not present

## 2018-10-12 DIAGNOSIS — N2581 Secondary hyperparathyroidism of renal origin: Secondary | ICD-10-CM | POA: Diagnosis not present

## 2018-12-06 DIAGNOSIS — N183 Chronic kidney disease, stage 3 (moderate): Secondary | ICD-10-CM | POA: Diagnosis not present

## 2018-12-26 DIAGNOSIS — Z23 Encounter for immunization: Secondary | ICD-10-CM | POA: Diagnosis not present

## 2019-01-01 DIAGNOSIS — R7309 Other abnormal glucose: Secondary | ICD-10-CM | POA: Diagnosis not present

## 2019-01-01 DIAGNOSIS — E039 Hypothyroidism, unspecified: Secondary | ICD-10-CM | POA: Diagnosis not present

## 2019-01-01 DIAGNOSIS — E7800 Pure hypercholesterolemia, unspecified: Secondary | ICD-10-CM | POA: Diagnosis not present

## 2019-01-01 DIAGNOSIS — N1832 Chronic kidney disease, stage 3b: Secondary | ICD-10-CM | POA: Diagnosis not present

## 2019-01-01 DIAGNOSIS — M3131 Wegener's granulomatosis with renal involvement: Secondary | ICD-10-CM | POA: Diagnosis not present

## 2019-01-01 DIAGNOSIS — G4733 Obstructive sleep apnea (adult) (pediatric): Secondary | ICD-10-CM | POA: Diagnosis not present

## 2019-01-17 DIAGNOSIS — Z961 Presence of intraocular lens: Secondary | ICD-10-CM | POA: Diagnosis not present

## 2019-02-14 DIAGNOSIS — M3131 Wegener's granulomatosis with renal involvement: Secondary | ICD-10-CM | POA: Diagnosis not present

## 2019-02-14 DIAGNOSIS — D631 Anemia in chronic kidney disease: Secondary | ICD-10-CM | POA: Diagnosis not present

## 2019-02-14 DIAGNOSIS — N183 Chronic kidney disease, stage 3 unspecified: Secondary | ICD-10-CM | POA: Diagnosis not present

## 2019-02-14 DIAGNOSIS — E785 Hyperlipidemia, unspecified: Secondary | ICD-10-CM | POA: Diagnosis not present

## 2019-02-14 DIAGNOSIS — Z6830 Body mass index (BMI) 30.0-30.9, adult: Secondary | ICD-10-CM | POA: Diagnosis not present

## 2019-02-14 DIAGNOSIS — K219 Gastro-esophageal reflux disease without esophagitis: Secondary | ICD-10-CM | POA: Diagnosis not present

## 2019-02-14 DIAGNOSIS — N179 Acute kidney failure, unspecified: Secondary | ICD-10-CM | POA: Diagnosis not present

## 2019-02-14 DIAGNOSIS — N2581 Secondary hyperparathyroidism of renal origin: Secondary | ICD-10-CM | POA: Diagnosis not present

## 2019-02-14 DIAGNOSIS — N189 Chronic kidney disease, unspecified: Secondary | ICD-10-CM | POA: Diagnosis not present

## 2019-02-14 DIAGNOSIS — I129 Hypertensive chronic kidney disease with stage 1 through stage 4 chronic kidney disease, or unspecified chronic kidney disease: Secondary | ICD-10-CM | POA: Diagnosis not present

## 2019-02-25 DIAGNOSIS — N183 Chronic kidney disease, stage 3 unspecified: Secondary | ICD-10-CM | POA: Diagnosis not present

## 2019-04-05 ENCOUNTER — Ambulatory Visit: Payer: Medicare Other | Attending: Internal Medicine

## 2019-04-05 DIAGNOSIS — Z23 Encounter for immunization: Secondary | ICD-10-CM | POA: Insufficient documentation

## 2019-04-05 NOTE — Progress Notes (Signed)
   Covid-19 Vaccination Clinic  Name:  Nicholas Hudson    MRN: [id number] DOB: 1940/04/19  04/05/2019  Mr. Nicholas Hudson was observed post Covid-19 immunization for 15 minutes without incidence. He was provided with Vaccine Information Sheet and instruction to access the V-Safe system.   Nicholas Hudson was instructed to call 911 with any severe reactions post vaccine: Marland Kitchen Difficulty breathing  . Swelling of your face and throat  . A fast heartbeat  . A bad rash all over your body  . Dizziness and weakness    Immunizations Administered    Name Date Dose VIS Date Route   Pfizer COVID-19 Vaccine 04/05/2019  6:15 PM 0.3 mL 02/22/2019 Intramuscular   Manufacturer: [place]   Lot: [id number]   [place]: [id number]

## 2019-04-26 ENCOUNTER — Ambulatory Visit: Payer: Medicare Other | Attending: Internal Medicine

## 2019-04-26 DIAGNOSIS — Z23 Encounter for immunization: Secondary | ICD-10-CM | POA: Insufficient documentation

## 2019-04-26 NOTE — Progress Notes (Signed)
   Covid-19 Vaccination Clinic  Name:  GOPAL MELODY    MRN: [id number] DOB: 1940/07/25  04/26/2019  Mr. Cronic was observed post Covid-19 immunization for 15 minutes without incidence. He was provided with Vaccine Information Sheet and instruction to access the V-Safe system.   Mr. Tarman was instructed to call 911 with any severe reactions post vaccine: Marland Kitchen Difficulty breathing  . Swelling of your face and throat  . A fast heartbeat  . A bad rash all over your body  . Dizziness and weakness    Immunizations Administered    Name Date Dose VIS Date Route   Pfizer COVID-19 Vaccine 04/26/2019  1:59 PM 0.3 mL 02/22/2019 Intramuscular   Manufacturer: [place]   Lot: [id number]   [place]: [id number]

## 2019-05-20 DIAGNOSIS — N189 Chronic kidney disease, unspecified: Secondary | ICD-10-CM | POA: Diagnosis not present

## 2019-05-20 DIAGNOSIS — I129 Hypertensive chronic kidney disease with stage 1 through stage 4 chronic kidney disease, or unspecified chronic kidney disease: Secondary | ICD-10-CM | POA: Diagnosis not present

## 2019-05-20 DIAGNOSIS — K219 Gastro-esophageal reflux disease without esophagitis: Secondary | ICD-10-CM | POA: Diagnosis not present

## 2019-05-20 DIAGNOSIS — N183 Chronic kidney disease, stage 3 unspecified: Secondary | ICD-10-CM | POA: Diagnosis not present

## 2019-05-20 DIAGNOSIS — M3131 Wegener's granulomatosis with renal involvement: Secondary | ICD-10-CM | POA: Diagnosis not present

## 2019-05-20 DIAGNOSIS — E785 Hyperlipidemia, unspecified: Secondary | ICD-10-CM | POA: Diagnosis not present

## 2019-05-20 DIAGNOSIS — Z6830 Body mass index (BMI) 30.0-30.9, adult: Secondary | ICD-10-CM | POA: Diagnosis not present

## 2019-05-20 DIAGNOSIS — D631 Anemia in chronic kidney disease: Secondary | ICD-10-CM | POA: Diagnosis not present

## 2019-05-20 DIAGNOSIS — N179 Acute kidney failure, unspecified: Secondary | ICD-10-CM | POA: Diagnosis not present

## 2019-05-20 DIAGNOSIS — N2581 Secondary hyperparathyroidism of renal origin: Secondary | ICD-10-CM | POA: Diagnosis not present

## 2019-07-31 DIAGNOSIS — M3131 Wegener's granulomatosis with renal involvement: Secondary | ICD-10-CM | POA: Diagnosis not present

## 2019-07-31 DIAGNOSIS — E7800 Pure hypercholesterolemia, unspecified: Secondary | ICD-10-CM | POA: Diagnosis not present

## 2019-07-31 DIAGNOSIS — Z23 Encounter for immunization: Secondary | ICD-10-CM | POA: Diagnosis not present

## 2019-07-31 DIAGNOSIS — Z0000 Encounter for general adult medical examination without abnormal findings: Secondary | ICD-10-CM | POA: Diagnosis not present

## 2019-07-31 DIAGNOSIS — E663 Overweight: Secondary | ICD-10-CM | POA: Diagnosis not present

## 2019-07-31 DIAGNOSIS — R7301 Impaired fasting glucose: Secondary | ICD-10-CM | POA: Diagnosis not present

## 2019-07-31 DIAGNOSIS — G4733 Obstructive sleep apnea (adult) (pediatric): Secondary | ICD-10-CM | POA: Diagnosis not present

## 2019-07-31 DIAGNOSIS — Z1389 Encounter for screening for other disorder: Secondary | ICD-10-CM | POA: Diagnosis not present

## 2019-07-31 DIAGNOSIS — E039 Hypothyroidism, unspecified: Secondary | ICD-10-CM | POA: Diagnosis not present

## 2019-08-28 DIAGNOSIS — E039 Hypothyroidism, unspecified: Secondary | ICD-10-CM | POA: Diagnosis not present

## 2019-09-27 DIAGNOSIS — Z85820 Personal history of malignant melanoma of skin: Secondary | ICD-10-CM | POA: Diagnosis not present

## 2019-09-27 DIAGNOSIS — L905 Scar conditions and fibrosis of skin: Secondary | ICD-10-CM | POA: Diagnosis not present

## 2019-09-27 DIAGNOSIS — D1801 Hemangioma of skin and subcutaneous tissue: Secondary | ICD-10-CM | POA: Diagnosis not present

## 2019-09-27 DIAGNOSIS — B078 Other viral warts: Secondary | ICD-10-CM | POA: Diagnosis not present

## 2019-09-27 DIAGNOSIS — D225 Melanocytic nevi of trunk: Secondary | ICD-10-CM | POA: Diagnosis not present

## 2019-09-27 DIAGNOSIS — L218 Other seborrheic dermatitis: Secondary | ICD-10-CM | POA: Diagnosis not present

## 2019-10-07 DIAGNOSIS — M3131 Wegener's granulomatosis with renal involvement: Secondary | ICD-10-CM | POA: Diagnosis not present

## 2019-10-07 DIAGNOSIS — N189 Chronic kidney disease, unspecified: Secondary | ICD-10-CM | POA: Diagnosis not present

## 2019-10-07 DIAGNOSIS — I129 Hypertensive chronic kidney disease with stage 1 through stage 4 chronic kidney disease, or unspecified chronic kidney disease: Secondary | ICD-10-CM | POA: Diagnosis not present

## 2019-10-07 DIAGNOSIS — D631 Anemia in chronic kidney disease: Secondary | ICD-10-CM | POA: Diagnosis not present

## 2019-10-07 DIAGNOSIS — N2581 Secondary hyperparathyroidism of renal origin: Secondary | ICD-10-CM | POA: Diagnosis not present

## 2019-10-07 DIAGNOSIS — Z6830 Body mass index (BMI) 30.0-30.9, adult: Secondary | ICD-10-CM | POA: Diagnosis not present

## 2019-10-07 DIAGNOSIS — E785 Hyperlipidemia, unspecified: Secondary | ICD-10-CM | POA: Diagnosis not present

## 2019-10-07 DIAGNOSIS — N183 Chronic kidney disease, stage 3 unspecified: Secondary | ICD-10-CM | POA: Diagnosis not present

## 2019-10-07 DIAGNOSIS — N179 Acute kidney failure, unspecified: Secondary | ICD-10-CM | POA: Diagnosis not present

## 2019-10-07 DIAGNOSIS — K219 Gastro-esophageal reflux disease without esophagitis: Secondary | ICD-10-CM | POA: Diagnosis not present

## 2019-11-07 DIAGNOSIS — Z23 Encounter for immunization: Secondary | ICD-10-CM | POA: Diagnosis not present

## 2019-12-19 DIAGNOSIS — Z23 Encounter for immunization: Secondary | ICD-10-CM | POA: Diagnosis not present

## 2020-01-16 DIAGNOSIS — N138 Other obstructive and reflux uropathy: Secondary | ICD-10-CM | POA: Diagnosis not present

## 2020-01-16 DIAGNOSIS — N401 Enlarged prostate with lower urinary tract symptoms: Secondary | ICD-10-CM | POA: Diagnosis not present

## 2020-01-17 DIAGNOSIS — Z961 Presence of intraocular lens: Secondary | ICD-10-CM | POA: Diagnosis not present

## 2020-01-17 DIAGNOSIS — H35352 Cystoid macular degeneration, left eye: Secondary | ICD-10-CM | POA: Diagnosis not present

## 2020-01-22 ENCOUNTER — Encounter (INDEPENDENT_AMBULATORY_CARE_PROVIDER_SITE_OTHER): Payer: Self-pay | Admitting: Ophthalmology

## 2020-01-22 ENCOUNTER — Ambulatory Visit (INDEPENDENT_AMBULATORY_CARE_PROVIDER_SITE_OTHER): Payer: Medicare Other | Admitting: Ophthalmology

## 2020-01-22 ENCOUNTER — Other Ambulatory Visit: Payer: Self-pay

## 2020-01-22 DIAGNOSIS — H35352 Cystoid macular degeneration, left eye: Secondary | ICD-10-CM | POA: Diagnosis not present

## 2020-01-22 DIAGNOSIS — H43822 Vitreomacular adhesion, left eye: Secondary | ICD-10-CM | POA: Diagnosis not present

## 2020-01-22 MED ORDER — FLUORESCEIN SODIUM 10 % IV SOLN
500.0000 mg | INTRAVENOUS | Status: AC | PRN
  Administered 2020-01-22: 500 mg via INTRAVENOUS

## 2020-01-22 MED ORDER — KETOROLAC TROMETHAMINE 0.5 % OP SOLN: 1.0000 [drp] | Freq: Four times a day (QID) | OPHTHALMIC | 0 refills | Status: DC

## 2020-01-22 NOTE — Assessment & Plan Note (Signed)
This component of macular edema is likely is minor yet if the CME does not respond to topical therapy and/or periocular steroids, Kenalog, vitreal retinal traction may be playing a role

## 2020-01-22 NOTE — Progress Notes (Signed)
01/22/2020     CHIEF COMPLAINT Patient presents for Blurred Vision   HISTORY OF PRESENT ILLNESS: Nicholas Hudson is a 79 y.o. male who presents to the clinic today for:   HPI    Blurred Vision    In left eye.  Onset was unknown.  Vision is blurred.  Severity is moderate.  Occurring constantly.  It is worse throughout the day.  Since onset it is stable.  Treatments tried include no treatments.  Response to treatment was no improvement.  I, the attending physician,  performed the HPI with the patient and updated documentation appropriately.          Comments    Pt referred by Dr. Gershon Crane for CME OS  Pt states he had not noticed any issues with vision until it was checked at Dr. Inocencio Homes office. Pt states while he was there he noticed OS vision was more blurry than OD vision. Denies FOL and floaters.       Last edited by Tilda Franco on 01/22/2020  8:46 AM. (History)      Referring physician: Seward Carol, MD [address] [place],  [place] [address]  HISTORICAL INFORMATION:   Selected notes from the MEDICAL RECORD NUMBER    Lab Results  Component Value Date   HGBA1C 6.5 (H) 04/19/2016     CURRENT MEDICATIONS: Current Outpatient Medications (Ophthalmic Drugs)  Medication Sig  . ketorolac (ACULAR) 0.5 % ophthalmic solution Place 1 drop into the left eye 4 (four) times daily.   No current facility-administered medications for this visit. (Ophthalmic Drugs)   Current Outpatient Medications (Other)  Medication Sig  . allopurinol (ZYLOPRIM) 100 MG tablet Take 1 tablet (100 mg total) by mouth daily.  Marland Kitchen aspirin EC 81 MG tablet Take 81 mg by mouth daily.  . cholecalciferol (VITAMIN D) 1000 UNITS tablet Take 2,000 Units by mouth daily.  . cyanocobalamin 2000 MCG tablet Take 2,000 mcg by mouth daily.  . cyclophosphamide (CYTOXAN) 50 MG capsule Take 1 capsule (50 mg total) by mouth daily. Give on an empty stomach 1 hour before or 2 hours after  meals. Follow up with nephrologist  . finasteride (PROSCAR) 5 MG tablet Take 5 mg by mouth daily.  Marland Kitchen gabapentin (NEURONTIN) 300 MG capsule Take 300 mg by mouth at bedtime.  Marland Kitchen glimepiride (AMARYL) 1 MG tablet Take 1 tablet (1 mg total) by mouth daily with breakfast.  . levothyroxine (SYNTHROID, LEVOTHROID) 200 MCG tablet Take 200 mcg by mouth daily before breakfast.   . Omega-3 Fatty Acids (FISH OIL) 1000 MG CAPS Take 1,000 mg by mouth daily.   . pantoprazole (PROTONIX) 40 MG tablet Take 1 tablet (40 mg total) by mouth 2 (two) times daily.  . polyethylene glycol (MIRALAX / GLYCOLAX) packet Take 17 g by mouth daily as needed.  . predniSONE (DELTASONE) 20 MG tablet Take 3 tablets (60 mg total) by mouth daily with breakfast. As per nephrologist, please follow up with Nephrologist in 1-2 weeks.  . rosuvastatin (CRESTOR) 20 MG tablet Take 0.5 tablets (10 mg total) by mouth daily.  . sodium bicarbonate 650 MG tablet Take 1 tablet (650 mg total) by mouth daily.  Marland Kitchen sulfamethoxazole-trimethoprim (BACTRIM DS,SEPTRA DS) 800-160 MG tablet Take 1 tablet by mouth every Monday, Wednesday, and Friday at 6 PM.   No current facility-administered medications for this visit. (Other)      REVIEW OF SYSTEMS:    ALLERGIES No Known Allergies  PAST MEDICAL HISTORY Past  Medical History:  Diagnosis Date  . BPH (benign prostatic hyperplasia)   . Gout   . Hyperlipidemia   . Renal disorder   . Thyroid disease    Past Surgical History:  Procedure Laterality Date  . CATARACT EXTRACTION Bilateral    Dr. Gershon Crane  . COLONOSCOPY [name] 05/13/2016   Procedure: COLONOSCOPY;  Surgeon: Clarene Essex, MD;  Location: [facility] ENDOSCOPY;  Service: Endoscopy;  Laterality: [name];  . ESOPHAGOGASTRODUODENOSCOPY [name] 04/29/2016   Procedure: ESOPHAGOGASTRODUODENOSCOPY (EGD);  Surgeon: Laurence Spates, MD;  Location: [facility] ENDOSCOPY;  Service: Endoscopy;  Laterality: [name];  . ESOPHAGOGASTRODUODENOSCOPY [name] 05/07/2016   Procedure:  ESOPHAGOGASTRODUODENOSCOPY (EGD);  Surgeon: Laurence Spates, MD;  Location: [facility] ENDOSCOPY;  Service: Endoscopy;  Laterality: [name];  . ESOPHAGOGASTRODUODENOSCOPY (EGD) WITH PROPOFOL [name] 05/12/2016   Procedure: ESOPHAGOGASTRODUODENOSCOPY (EGD) WITH PROPOFOL;  Surgeon: Clarene Essex, MD;  Location: [facility] ENDOSCOPY;  Service: Endoscopy;  Laterality: [name];  . IR GENERIC HISTORICAL  04/22/2016   IR US GUIDE VASC ACCESS RIGHT 04/22/2016 Sandi Mariscal, MD [facility]  . IR GENERIC HISTORICAL  04/22/2016   IR FLUORO GUIDE CV LINE RIGHT 04/22/2016 Sandi Mariscal, MD [facility]  . IR GENERIC HISTORICAL  05/11/2016   IR REMOVAL TUN CV CATH W/O FL 05/11/2016 Markus Daft, MD [facility]    FAMILY HISTORY History reviewed. No pertinent family history.  SOCIAL HISTORY Social History   Tobacco Use  . Smoking status: Never Smoker  . Smokeless tobacco: Never Used  Substance Use Topics  . Alcohol use: No  . Drug use: No         OPHTHALMIC EXAM:  Base Eye Exam    Visual Acuity (Snellen - Linear)      Right Left   Dist [place] 20/40 + 20/40   Dist ph [place] 20/20 NI       Tonometry (Tonopen, 8:51 AM)      Right Left   Pressure 12 11       Pupils      Pupils Dark Light Shape React APD   Right PERRL 2 1 Round Brisk None   Left PERRL 2 2 Irregular Minimal None       Visual Fields (Counting fingers)      Left Right    Full Full       Neuro/Psych    Oriented x3: Yes   Mood/Affect: Normal       Dilation    Both eyes: 1.0% Mydriacyl, 2.5% Phenylephrine @ 8:51 AM        Slit Lamp and Fundus Exam    External Exam      Right Left   External Normal Normal       Slit Lamp Exam      Right Left   Lids/Lashes Normal Normal   Conjunctiva/Sclera White and quiet White and quiet   Cornea Clear Clear   Anterior Chamber Deep and quiet Deep and quiet   Iris Round and reactive Round and reactive   Lens Centered posterior chamber intraocular lens Centered posterior chamber intraocular lens   Anterior Vitreous Normal  Normal       Fundus Exam      Right Left   Posterior Vitreous Normal Normal   Disc Normal Normal   C/D Ratio 0.25 0.25   Macula Normal Cystoid macular edema   Vessels Normal Normal   Periphery Normal Normal          IMAGING AND PROCEDURES  Imaging and Procedures for 01/22/20  OCT, Retina - OU - Both  Eyes       Right Eye Quality was good. Scan locations included subfoveal. Central Foveal Thickness: 270. Progression has no prior data. Findings include normal observations.   Left Eye Quality was good. Scan locations included subfoveal. Central Foveal Thickness: 471. Progression has no prior data. Findings include cystoid macular edema, vitreomacular adhesion , vitreous traction.        Color Fundus Photography Optos - OU - Both Eyes       Right Eye Progression has no prior data. Disc findings include normal observations. Macula : normal observations. Vessels : normal observations. Periphery : normal observations.   Left Eye Progression has no prior data. Disc findings include normal observations. Macula : edema. Vessels : normal observations. Periphery : normal observations.   Notes Incidental PVD noted OD  CME OS, not clinically detectable by color photography       Fluorescein Angiography Optos (Transit OS)       Injection:  500 mg Fluorescein Sodium 10 % injection   NDC: [phone]   Route: Intravenous, Site: Right ArmRight Eye   Progression has no prior data. Mid/Late phase findings include normal observations.   Left Eye   Progression has no prior data. Early phase findings include leakage. Mid/Late phase findings include leakage.   Notes Diffuse macular CME, no focal leakage of MAC-TEL, disc with light edema all suggestive of pseudophakic CME. OS  Interestingly patient does have a history of rubbing the eyes somewhat vigorously with recent itching for some months                ASSESSMENT/PLAN:  Cystoid macular edema of left  eye Diffuse macular CME, no focal leakage of MAC-TEL, disc with light edema all suggestive of pseudophakic CME. OS  Interestingly patient does have a history of rubbing the eyes somewhat vigorously with recent itching for some monthsThe nature of cystoid macular edema including causes, exacerbating factors, and treatments including steroid and nonsteroidal anti-inflammatory drops, periocular injections of steroids, and intravitreal injections of steroids were reviewed. An informational brochure was offered.  The patient's questions were answered. The potential side effects of the various treatments were reviewed including the potential for intraocular pressure rise with steroid treatments.  I often use topical NSAIDS alone or in conjunction with periocular steroids to resolve this condition.   Patient instructed not to compress or "rub" on the eye.  More rarely, surgery may be considered if the condition is not improving.  Vitreomacular traction syndrome, left This component of macular edema is likely is minor yet if the CME does not respond to topical therapy and/or periocular steroids, Kenalog, vitreal retinal traction may be playing a role      ICD-10-CM   1. Cystoid macular edema of left eye  H35.352 OCT, Retina - OU - Both Eyes    Color Fundus Photography Optos - OU - Both Eyes    Fluorescein Angiography Optos (Transit OS)    Fluorescein Sodium 10 % injection 500 mg  2. Vitreomacular traction syndrome, left  H43.822 OCT, Retina - OU - Both Eyes    1.  2.  3.  Ophthalmic Meds Ordered this visit:  Meds ordered this encounter  Medications  . Fluorescein Sodium 10 % injection 500 mg  . ketorolac (ACULAR) 0.5 % ophthalmic solution    Sig: Place 1 drop into the left eye 4 (four) times daily.    Dispense:  5 mL    Refill:  0       Return in about  4 weeks (around 02/19/2020) for dilate, OS, OCT.  There are no Patient Instructions on file for this visit.   Explained the diagnoses,  plan, and follow up with the patient and they expressed understanding.  Patient expressed understanding of the importance of proper follow up care.   Clent Demark Hyatt Capobianco M.D. Diseases & Surgery of the Retina and Vitreous Retina & Diabetic [place] 01/22/20     Abbreviations: M myopia (nearsighted); A astigmatism; H hyperopia (farsighted); P presbyopia; Mrx spectacle prescription;  CTL contact lenses; OD right eye; OS left eye; OU both eyes  XT exotropia; ET esotropia; PEK punctate epithelial keratitis; PEE punctate epithelial erosions; DES dry eye syndrome; MGD meibomian gland dysfunction; ATs artificial tears; PFAT's preservative free artificial tears; [employer] nuclear sclerotic cataract; PSC posterior subcapsular cataract; ERM epi-retinal membrane; PVD posterior vitreous detachment; RD retinal detachment; DM diabetes mellitus; DR diabetic retinopathy; NPDR non-proliferative diabetic retinopathy; PDR proliferative diabetic retinopathy; CSME clinically significant macular edema; DME diabetic macular edema; dbh dot blot hemorrhages; CWS cotton wool spot; POAG primary open angle glaucoma; C/D cup-to-disc ratio; HVF humphrey visual field; GVF goldmann visual field; OCT optical coherence tomography; IOP intraocular pressure; BRVO Branch retinal vein occlusion; CRVO central retinal vein occlusion; CRAO central retinal artery occlusion; BRAO branch retinal artery occlusion; RT retinal tear; SB scleral buckle; PPV pars plana vitrectomy; VH Vitreous hemorrhage; PRP panretinal laser photocoagulation; IVK intravitreal kenalog; VMT vitreomacular traction; MH Macular hole;  NVD neovascularization of the disc; NVE neovascularization elsewhere; AREDS age related eye disease study; ARMD age related macular degeneration; POAG primary open angle glaucoma; EBMD epithelial/anterior basement membrane dystrophy; ACIOL anterior chamber intraocular lens; IOL intraocular lens; PCIOL posterior chamber intraocular lens; Phaco/IOL  phacoemulsification with intraocular lens placement; [place] photorefractive keratectomy; LASIK laser assisted in situ keratomileusis; HTN hypertension; DM diabetes mellitus; COPD chronic obstructive pulmonary disease

## 2020-01-22 NOTE — Assessment & Plan Note (Signed)
Diffuse macular CME, no focal leakage of MAC-TEL, disc with light edema all suggestive of pseudophakic CME. OS  Interestingly patient does have a history of rubbing the eyes somewhat vigorously with recent itching for some monthsThe nature of cystoid macular edema including causes, exacerbating factors, and treatments including steroid and nonsteroidal anti-inflammatory drops, periocular injections of steroids, and intravitreal injections of steroids were reviewed. An informational brochure was offered.  The patient's questions were answered. The potential side effects of the various treatments were reviewed including the potential for intraocular pressure rise with steroid treatments.  I often use topical NSAIDS alone or in conjunction with periocular steroids to resolve this condition.   Patient instructed not to compress or "rub" on the eye.  More rarely, surgery may be considered if the condition is not improving.

## 2020-01-31 DIAGNOSIS — Z23 Encounter for immunization: Secondary | ICD-10-CM | POA: Diagnosis not present

## 2020-01-31 DIAGNOSIS — E7800 Pure hypercholesterolemia, unspecified: Secondary | ICD-10-CM | POA: Diagnosis not present

## 2020-01-31 DIAGNOSIS — E039 Hypothyroidism, unspecified: Secondary | ICD-10-CM | POA: Diagnosis not present

## 2020-01-31 DIAGNOSIS — G4730 Sleep apnea, unspecified: Secondary | ICD-10-CM | POA: Diagnosis not present

## 2020-01-31 DIAGNOSIS — R7309 Other abnormal glucose: Secondary | ICD-10-CM | POA: Diagnosis not present

## 2020-01-31 DIAGNOSIS — M3131 Wegener's granulomatosis with renal involvement: Secondary | ICD-10-CM | POA: Diagnosis not present

## 2020-02-04 ENCOUNTER — Other Ambulatory Visit (INDEPENDENT_AMBULATORY_CARE_PROVIDER_SITE_OTHER): Payer: Self-pay | Admitting: Ophthalmology

## 2020-02-13 DIAGNOSIS — I129 Hypertensive chronic kidney disease with stage 1 through stage 4 chronic kidney disease, or unspecified chronic kidney disease: Secondary | ICD-10-CM | POA: Diagnosis not present

## 2020-02-13 DIAGNOSIS — N189 Chronic kidney disease, unspecified: Secondary | ICD-10-CM | POA: Diagnosis not present

## 2020-02-13 DIAGNOSIS — E785 Hyperlipidemia, unspecified: Secondary | ICD-10-CM | POA: Diagnosis not present

## 2020-02-13 DIAGNOSIS — N183 Chronic kidney disease, stage 3 unspecified: Secondary | ICD-10-CM | POA: Diagnosis not present

## 2020-02-13 DIAGNOSIS — D631 Anemia in chronic kidney disease: Secondary | ICD-10-CM | POA: Diagnosis not present

## 2020-02-13 DIAGNOSIS — Z6830 Body mass index (BMI) 30.0-30.9, adult: Secondary | ICD-10-CM | POA: Diagnosis not present

## 2020-02-13 DIAGNOSIS — N179 Acute kidney failure, unspecified: Secondary | ICD-10-CM | POA: Diagnosis not present

## 2020-02-13 DIAGNOSIS — M3131 Wegener's granulomatosis with renal involvement: Secondary | ICD-10-CM | POA: Diagnosis not present

## 2020-02-13 DIAGNOSIS — K219 Gastro-esophageal reflux disease without esophagitis: Secondary | ICD-10-CM | POA: Diagnosis not present

## 2020-02-13 DIAGNOSIS — N2581 Secondary hyperparathyroidism of renal origin: Secondary | ICD-10-CM | POA: Diagnosis not present

## 2020-02-19 ENCOUNTER — Other Ambulatory Visit: Payer: Self-pay

## 2020-02-19 ENCOUNTER — Ambulatory Visit (INDEPENDENT_AMBULATORY_CARE_PROVIDER_SITE_OTHER): Payer: Medicare Other | Admitting: Ophthalmology

## 2020-02-19 ENCOUNTER — Encounter (INDEPENDENT_AMBULATORY_CARE_PROVIDER_SITE_OTHER): Payer: Self-pay | Admitting: Ophthalmology

## 2020-02-19 DIAGNOSIS — H43822 Vitreomacular adhesion, left eye: Secondary | ICD-10-CM | POA: Diagnosis not present

## 2020-02-19 DIAGNOSIS — H35352 Cystoid macular degeneration, left eye: Secondary | ICD-10-CM

## 2020-02-19 NOTE — Progress Notes (Signed)
02/19/2020     CHIEF COMPLAINT Patient presents for Retina Follow Up   HISTORY OF PRESENT ILLNESS: Nicholas Hudson is a 79 y.o. male who presents to the clinic today for:   HPI    Retina Follow Up    Diagnosis: CME.  In left eye.  Severity is moderate.  Duration of 4 weeks.  Since onset it is stable.  I, the attending physician,  performed the HPI with the patient and updated documentation appropriately.          Comments    4 Week CME f\u OS. OCT  Pt states he can tell OS vision is not as good as OD vision. Pt "trying" to use gtts QID as directed.       Last edited by Tilda Franco on 02/19/2020  8:16 AM. (History)      Referring physician: Seward Carol, MD [address] [place],  [place] [address]  HISTORICAL INFORMATION:   Selected notes from the MEDICAL RECORD NUMBER    Lab Results  Component Value Date   HGBA1C 6.5 (H) 04/19/2016     CURRENT MEDICATIONS: Current Outpatient Medications (Ophthalmic Drugs)  Medication Sig  . ketorolac (ACULAR) 0.5 % ophthalmic solution INSTILL 1 DROP INTO LEFT EYE 4 TIMES A DAY   No current facility-administered medications for this visit. (Ophthalmic Drugs)   Current Outpatient Medications (Other)  Medication Sig  . allopurinol (ZYLOPRIM) 100 MG tablet Take 1 tablet (100 mg total) by mouth daily.  Marland Kitchen aspirin EC 81 MG tablet Take 81 mg by mouth daily.  . cholecalciferol (VITAMIN D) 1000 UNITS tablet Take 2,000 Units by mouth daily.  . cyanocobalamin 2000 MCG tablet Take 2,000 mcg by mouth daily.  . cyclophosphamide (CYTOXAN) 50 MG capsule Take 1 capsule (50 mg total) by mouth daily. Give on an empty stomach 1 hour before or 2 hours after meals. Follow up with nephrologist  . finasteride (PROSCAR) 5 MG tablet Take 5 mg by mouth daily.  Marland Kitchen gabapentin (NEURONTIN) 300 MG capsule Take 300 mg by mouth at bedtime.  Marland Kitchen glimepiride (AMARYL) 1 MG tablet Take 1 tablet (1 mg total) by mouth daily with breakfast.  .  levothyroxine (SYNTHROID, LEVOTHROID) 200 MCG tablet Take 200 mcg by mouth daily before breakfast.   . Omega-3 Fatty Acids (FISH OIL) 1000 MG CAPS Take 1,000 mg by mouth daily.   . pantoprazole (PROTONIX) 40 MG tablet Take 1 tablet (40 mg total) by mouth 2 (two) times daily.  . polyethylene glycol (MIRALAX / GLYCOLAX) packet Take 17 g by mouth daily as needed.  . predniSONE (DELTASONE) 20 MG tablet Take 3 tablets (60 mg total) by mouth daily with breakfast. As per nephrologist, please follow up with Nephrologist in 1-2 weeks.  . rosuvastatin (CRESTOR) 20 MG tablet Take 0.5 tablets (10 mg total) by mouth daily.  . sodium bicarbonate 650 MG tablet Take 1 tablet (650 mg total) by mouth daily.  Marland Kitchen sulfamethoxazole-trimethoprim (BACTRIM DS,SEPTRA DS) 800-160 MG tablet Take 1 tablet by mouth every Monday, Wednesday, and Friday at 6 PM.   No current facility-administered medications for this visit. (Other)      REVIEW OF SYSTEMS:    ALLERGIES No Known Allergies  PAST MEDICAL HISTORY Past Medical History:  Diagnosis Date  . BPH (benign prostatic hyperplasia)   . Gout   . Hyperlipidemia   . Renal disorder   . Thyroid disease    Past Surgical History:  Procedure Laterality Date  .  CATARACT EXTRACTION Bilateral    Dr. Gershon Crane  . COLONOSCOPY [name] 05/13/2016   Procedure: COLONOSCOPY;  Surgeon: Clarene Essex, MD;  Location: [facility] ENDOSCOPY;  Service: Endoscopy;  Laterality: [name];  . ESOPHAGOGASTRODUODENOSCOPY [name] 04/29/2016   Procedure: ESOPHAGOGASTRODUODENOSCOPY (EGD);  Surgeon: Laurence Spates, MD;  Location: [facility] ENDOSCOPY;  Service: Endoscopy;  Laterality: [name];  . ESOPHAGOGASTRODUODENOSCOPY [name] 05/07/2016   Procedure: ESOPHAGOGASTRODUODENOSCOPY (EGD);  Surgeon: Laurence Spates, MD;  Location: [facility] ENDOSCOPY;  Service: Endoscopy;  Laterality: [name];  . ESOPHAGOGASTRODUODENOSCOPY (EGD) WITH PROPOFOL [name] 05/12/2016   Procedure: ESOPHAGOGASTRODUODENOSCOPY (EGD) WITH PROPOFOL;  Surgeon: Clarene Essex, MD;  Location:  [facility] ENDOSCOPY;  Service: Endoscopy;  Laterality: [name];  . IR GENERIC HISTORICAL  04/22/2016   IR US GUIDE VASC ACCESS RIGHT 04/22/2016 Sandi Mariscal, MD [facility]  . IR GENERIC HISTORICAL  04/22/2016   IR FLUORO GUIDE CV LINE RIGHT 04/22/2016 Sandi Mariscal, MD [facility]  . IR GENERIC HISTORICAL  05/11/2016   IR REMOVAL TUN CV CATH W/O FL 05/11/2016 Markus Daft, MD [facility]    FAMILY HISTORY History reviewed. No pertinent family history.  SOCIAL HISTORY Social History   Tobacco Use  . Smoking status: Never Smoker  . Smokeless tobacco: Never Used  Substance Use Topics  . Alcohol use: No  . Drug use: No         OPHTHALMIC EXAM:  Base Eye Exam    Visual Acuity (Snellen - Linear)      Right Left   Dist [place] 20/20 -1 20/30 -2   Dist ph [place]  NI       Tonometry (Tonopen, 8:20 AM)      Right Left   Pressure 13 15       Pupils      Pupils Dark Light Shape React APD   Right PERRL 2 1 Round Brisk None   Left PERRL 2 2 Irregular [place]ugg[place] None       Neuro/Psych    Oriented x3: Yes   Mood/Affect: Normal       Dilation    Left eye: 1.0% Mydriacyl, 2.5% Phenylephrine @ 8:20 AM        Slit Lamp and Fundus Exam    External Exam      Right Left   External Normal Normal       Slit Lamp Exam      Right Left   Lids/Lashes Normal Normal   Conjunctiva/Sclera White and quiet White and quiet   Cornea Clear Clear   Anterior Chamber Deep and quiet Deep and quiet   Iris Round and reactive Round and reactive   Lens Centered posterior chamber intraocular lens Centered posterior chamber intraocular lens   Anterior Vitreous Normal Normal       Fundus Exam      Right Left   Posterior Vitreous  Normal   Disc  Normal   C/D Ratio  0.25   Macula  Cystoid macular edema is less   Vessels  Normal   Periphery  Normal          IMAGING AND PROCEDURES  Imaging and Procedures for 02/19/20  OCT, Retina - OU - Both Eyes       Right Eye Quality was good. Scan locations included  subfoveal. Central Foveal Thickness: 268. Progression has been stable. Findings include normal observations, normal foveal contour.   Left Eye Quality was good. Scan locations included subfoveal. Central Foveal Thickness: 287. Progression has improved. Findings include vitreomacular adhesion , cystoid macular edema.   Notes  OS with much less CME on examination today these findings were displayed and reviewed with the patient                ASSESSMENT/PLAN:  Vitreomacular traction syndrome, left Minor to moderate impact on the CME.  With topical NSAIDs improving the CME, not likely to be a causative factor to the CME OS.  Cystoid macular edema of left eye Much improved CME over the last 4 weeks, using topical NSAIDs alone.  Patient has been reasonably compliant with it.  I have encouraged patient to continue on topical NSAIDs 4 times daily OS and we will follow up in 2 months at which time we'll likely to be over to cease its use.  I have also asked the patient not to compress or mash or rub the eye      ICD-10-CM   1. Cystoid macular edema of left eye  H35.352 OCT, Retina - OU - Both Eyes  2. Vitreomacular traction syndrome, left  H43.822     1.  2.  3.  Ophthalmic Meds Ordered this visit:  No orders of the defined types were placed in this encounter.      Return in about 2 months (around 04/21/2020) for DILATE OU, OCT.  Patient Instructions  Patient instructed to use topical ketorolac 1 drop 4 times daily left eye for the next 2 months to complete the resolution of CME OS  I have informed the patient that proper use of CPAP to maximize oxygenation of the brain will also help the oxygenation of the macular regions in each eye  Patient also inquired about the use of ED medication, Viagra.  Splane the patient that this his optic nerve is not the high risk for developing anterior ischemic optic neuropathy with the hypotensive effects of of an ED medication.   Nonetheless his age group that could happen.  Seeing "blue" is not a risk factor for this condition.  Therefore in summary regarding ED medication, there is no current impact on his factors to his vision from that use of medication    Explained the diagnoses, plan, and follow up with the patient and they expressed understanding.  Patient expressed understanding of the importance of proper follow up care.   Clent Demark Kendyll Huettner M.D. Diseases & Surgery of the Retina and Vitreous Retina & Diabetic [place] 02/19/20     Abbreviations: M myopia (nearsighted); A astigmatism; H hyperopia (farsighted); P presbyopia; Mrx spectacle prescription;  CTL contact lenses; OD right eye; OS left eye; OU both eyes  XT exotropia; ET esotropia; PEK punctate epithelial keratitis; PEE punctate epithelial erosions; DES dry eye syndrome; MGD meibomian gland dysfunction; ATs artificial tears; PFAT's preservative free artificial tears; [place] nuclear sclerotic cataract; PSC posterior subcapsular cataract; ERM epi-retinal membrane; PVD posterior vitreous detachment; RD retinal detachment; DM diabetes mellitus; DR diabetic retinopathy; NPDR non-proliferative diabetic retinopathy; PDR proliferative diabetic retinopathy; CSME clinically significant macular edema; DME diabetic macular edema; dbh dot blot hemorrhages; CWS cotton wool spot; POAG primary open angle glaucoma; C/D cup-to-disc ratio; HVF humphrey visual field; GVF goldmann visual field; OCT optical coherence tomography; IOP intraocular pressure; BRVO Branch retinal vein occlusion; CRVO central retinal vein occlusion; CRAO central retinal artery occlusion; BRAO branch retinal artery occlusion; RT retinal tear; SB scleral buckle; PPV pars plana vitrectomy; VH Vitreous hemorrhage; PRP panretinal laser photocoagulation; IVK intravitreal kenalog; VMT vitreomacular traction; MH Macular hole;  NVD neovascularization of the disc; NVE neovascularization elsewhere; AREDS age related eye  disease study; ARMD  age related macular degeneration; POAG primary open angle glaucoma; EBMD epithelial/anterior basement membrane dystrophy; ACIOL anterior chamber intraocular lens; IOL intraocular lens; PCIOL posterior chamber intraocular lens; Phaco/IOL phacoemulsification with intraocular lens placement; [place] photorefractive keratectomy; LASIK laser assisted in situ keratomileusis; HTN hypertension; DM diabetes mellitus; COPD chronic obstructive pulmonary disease

## 2020-02-19 NOTE — Patient Instructions (Signed)
Patient instructed to use topical ketorolac 1 drop 4 times daily left eye for the next 2 months to complete the resolution of CME OS  I have informed the patient that proper use of CPAP to maximize oxygenation of the brain will also help the oxygenation of the macular regions in each eye  Patient also inquired about the use of ED medication, Viagra.  Splane the patient that this his optic nerve is not the high risk for developing anterior ischemic optic neuropathy with the hypotensive effects of of an ED medication.  Nonetheless his age group that could happen.  Seeing "blue" is not a risk factor for this condition.  Therefore in summary regarding ED medication, there is no current impact on his factors to his vision from that use of medication

## 2020-02-19 NOTE — Assessment & Plan Note (Signed)
Minor to moderate impact on the CME.  With topical NSAIDs improving the CME, not likely to be a causative factor to the CME OS.

## 2020-02-19 NOTE — Assessment & Plan Note (Signed)
Much improved CME over the last 4 weeks, using topical NSAIDs alone.  Patient has been reasonably compliant with it.  I have encouraged patient to continue on topical NSAIDs 4 times daily OS and we will follow up in 2 months at which time we'll likely to be over to cease its use.  I have also asked the patient not to compress or mash or rub the eye

## 2020-02-20 ENCOUNTER — Other Ambulatory Visit (INDEPENDENT_AMBULATORY_CARE_PROVIDER_SITE_OTHER): Payer: Self-pay | Admitting: Ophthalmology

## 2020-04-07 ENCOUNTER — Other Ambulatory Visit (INDEPENDENT_AMBULATORY_CARE_PROVIDER_SITE_OTHER): Payer: Self-pay | Admitting: Ophthalmology

## 2020-04-21 ENCOUNTER — Encounter (INDEPENDENT_AMBULATORY_CARE_PROVIDER_SITE_OTHER): Payer: Self-pay | Admitting: Ophthalmology

## 2020-04-21 ENCOUNTER — Other Ambulatory Visit: Payer: Self-pay

## 2020-04-21 ENCOUNTER — Ambulatory Visit (INDEPENDENT_AMBULATORY_CARE_PROVIDER_SITE_OTHER): Payer: Medicare Other | Admitting: Ophthalmology

## 2020-04-21 DIAGNOSIS — Z961 Presence of intraocular lens: Secondary | ICD-10-CM

## 2020-04-21 DIAGNOSIS — H43822 Vitreomacular adhesion, left eye: Secondary | ICD-10-CM | POA: Diagnosis not present

## 2020-04-21 DIAGNOSIS — H35352 Cystoid macular degeneration, left eye: Secondary | ICD-10-CM | POA: Diagnosis not present

## 2020-04-21 DIAGNOSIS — H43821 Vitreomacular adhesion, right eye: Secondary | ICD-10-CM

## 2020-04-21 MED ORDER — KETOROLAC TROMETHAMINE 0.5 % OP SOLN: 1.0000 [drp] | Freq: Two times a day (BID) | OPHTHALMIC | 1 refills | Status: AC

## 2020-04-21 NOTE — Assessment & Plan Note (Signed)
Present but not pathologic

## 2020-04-21 NOTE — Assessment & Plan Note (Signed)
Present by OCT, not pathologic

## 2020-04-21 NOTE — Assessment & Plan Note (Signed)
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  Patient is mostly compliant with topical use typically using a ketorolac 3 times daily.  We will taper the topical medication to twice daily for 2 more months simply to prevent recurrence of this condition.  I instructed the patient not to mash or compress or rub the eye

## 2020-04-21 NOTE — Progress Notes (Signed)
04/21/2020     CHIEF COMPLAINT Patient presents for Retina Follow Up (2 MO FU OU///Pt reports stable vision OU. Pt denies any new F/F, pain, or pressure OU. )   HISTORY OF PRESENT ILLNESS: Nicholas Hudson is a 80 y.o. male who presents to the clinic today for:   HPI    Retina Follow Up    Patient presents with  Other.  In both eyes.  This started 2 months ago.  Duration of 2 months.  Since onset it is stable. Additional comments: 2 MO FU OU   Pt reports stable vision OU. Pt denies any new F/F, pain, or pressure OU.        Last edited by Nichola Sizer D on 04/21/2020  8:17 AM. (History)      Referring physician: Seward Carol, MD [address] [place],   [address]  HISTORICAL INFORMATION:   Selected notes from the MEDICAL RECORD NUMBER    Lab Results  Component Value Date   HGBA1C 6.5 (H) 04/19/2016     CURRENT MEDICATIONS: Current Outpatient Medications (Ophthalmic Drugs)  Medication Sig  . ketorolac (ACULAR) 0.5 % ophthalmic solution Place 1 drop into the left eye 2 (two) times daily.   No current facility-administered medications for this visit. (Ophthalmic Drugs)   Current Outpatient Medications (Other)  Medication Sig  . allopurinol (ZYLOPRIM) 100 MG tablet Take 1 tablet (100 mg total) by mouth daily.  Marland Kitchen aspirin EC 81 MG tablet Take 81 mg by mouth daily.  . cholecalciferol (VITAMIN D) 1000 UNITS tablet Take 2,000 Units by mouth daily.  . cyanocobalamin 2000 MCG tablet Take 2,000 mcg by mouth daily.  . cyclophosphamide (CYTOXAN) 50 MG capsule Take 1 capsule (50 mg total) by mouth daily. Give on an empty stomach 1 hour before or 2 hours after meals. Follow up with nephrologist  . finasteride (PROSCAR) 5 MG tablet Take 5 mg by mouth daily.  Marland Kitchen gabapentin (NEURONTIN) 300 MG capsule Take 300 mg by mouth at bedtime.  Marland Kitchen glimepiride (AMARYL) 1 MG tablet Take 1 tablet (1 mg total) by mouth daily with breakfast.  . levothyroxine (SYNTHROID,  LEVOTHROID) 200 MCG tablet Take 200 mcg by mouth daily before breakfast.   . Omega-3 Fatty Acids (FISH OIL) 1000 MG CAPS Take 1,000 mg by mouth daily.   . pantoprazole (PROTONIX) 40 MG tablet Take 1 tablet (40 mg total) by mouth 2 (two) times daily.  . polyethylene glycol (MIRALAX / GLYCOLAX) packet Take 17 g by mouth daily as needed.  . predniSONE (DELTASONE) 20 MG tablet Take 3 tablets (60 mg total) by mouth daily with breakfast. As per nephrologist, please follow up with Nephrologist in 1-2 weeks.  . rosuvastatin (CRESTOR) 20 MG tablet Take 0.5 tablets (10 mg total) by mouth daily.  . sodium bicarbonate 650 MG tablet Take 1 tablet (650 mg total) by mouth daily.  Marland Kitchen sulfamethoxazole-trimethoprim (BACTRIM DS,SEPTRA DS) 800-160 MG tablet Take 1 tablet by mouth every Monday, Wednesday, and Friday at 6 PM.   No current facility-administered medications for this visit. (Other)      REVIEW OF SYSTEMS:    ALLERGIES No Known Allergies  PAST MEDICAL HISTORY Past Medical History:  Diagnosis Date  . BPH (benign prostatic hyperplasia)   . Gout   . Hyperlipidemia   . Renal disorder   . Thyroid disease    Past Surgical History:  Procedure Laterality Date  . CATARACT EXTRACTION Bilateral    Dr. Gershon Crane  .  COLONOSCOPY [name] 05/13/2016   Procedure: COLONOSCOPY;  Surgeon: Clarene Essex, MD;  Location[facility] ENDOSCOPY;  Service: Endoscopy;  Laterality: [name];  . ESOPHAGOGASTRODUODENOSCOPY [name] 04/29/2016   Procedure: ESOPHAGOGASTRODUODENOSCOPY (EGD);  Surgeon: Laurence Spates, MD;  Location: [facility] ENDOSCOPY;  Service: Endoscopy;  Laterality: [name];  . ESOPHAGOGASTRODUODENOSCOPY [name] 05/07/2016   Procedure: ESOPHAGOGASTRODUODENOSCOPY (EGD);  Surgeon: Laurence Spates, MD;  Location[facility] ENDOSCOPY;  Service: Endoscopy;  Laterality: [name];  . ESOPHAGOGASTRODUODENOSCOPY (EGD) WITH PROPOFOL [name] 05/12/2016   Procedure: ESOPHAGOGASTRODUODENOSCOPY (EGD) WITH PROPOFOL;  Surgeon: Clarene Essex, MD;  Location: [facility] ENDOSCOPY;  Service:  Endoscopy;  Laterality: [name];  . IR GENERIC HISTORICAL  04/22/2016   IR US GUIDE VASC ACCESS RIGHT 04/22/2016 Sandi Mariscal, MD [facility]  . IR GENERIC HISTORICAL  04/22/2016   IR FLUORO GUIDE CV LINE RIGHT 04/22/2016 Sandi Mariscal, MD [facility]  . IR GENERIC HISTORICAL  05/11/2016   IR REMOVAL TUN CV CATH W/O FL 05/11/2016 Markus Daft, MD [facility]    FAMILY HISTORY History reviewed. No pertinent family history.  SOCIAL HISTORY Social History   Tobacco Use  . Smoking status: Never Smoker  . Smokeless tobacco: Never Used  Substance Use Topics  . Alcohol use: No  . Drug use: No         OPHTHALMIC EXAM:  Base Eye Exam    Visual Acuity (ETDRS)      Right Left   Dist [place] 20/20 -1 20/25 -2       Tonometry (Tonopen, 8:21 AM)      Right Left   Pressure 20 16       Pupils      Pupils Dark Light Shape React APD   Right PERRL 2 1 Round Brisk None   Left PERRL 2 2 Irregular Sluggish None       Visual Fields (Counting fingers)      Left Right    Full Full       Extraocular Movement      Right Left    Full Full       Neuro/Psych    Oriented x3: Yes   Mood/Affect: Normal       Dilation    Both eyes: 1.0% Mydriacyl, 2.5% Phenylephrine @ 8:21 AM        Slit Lamp and Fundus Exam    External Exam      Right Left   External Normal Normal       Slit Lamp Exam      Right Left   Lids/Lashes Normal Normal   Conjunctiva/Sclera White and quiet White and quiet   Cornea Clear Clear   Anterior Chamber Deep and quiet Deep and quiet   Iris Round and reactive Round and reactive   Lens Centered posterior chamber intraocular lens Centered posterior chamber intraocular lens   Anterior Vitreous Normal Normal       Fundus Exam      Right Left   Posterior Vitreous Normal Normal   Disc Normal Normal   C/D Ratio 0.25 0.35   Macula Normal Normal, no cystoid macular edema   Vessels Normal Normal   Periphery Normal Normal          IMAGING AND PROCEDURES  Imaging and  Procedures for 04/21/20  OCT, Retina - OU - Both Eyes       Right Eye Quality was good. Scan locations included subfoveal. Central Foveal Thickness: 268. Progression has been stable. Findings include normal observations, normal foveal contour.   Left Eye Quality was  good. Scan locations included subfoveal. Central Foveal Thickness: 318. Progression has improved. Findings include vitreomacular adhesion .   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  Patient is mostly compliant with topical use typically using a ketorolac 3 times daily.  We will taper the topical medication to twice daily for 2 more months simply to prevent recurrence of this condition.  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 Cystoid macular edema of left eye  H35.352 OCT, Retina - OU - Both Eyes  2. Vitreomacular traction syndrome, left  H43.822   3. Vitreomacular adhesion of right eye  H43.821   4. Pseudophakia of both eyes  Z96.1     1.  Patient to complete the topical medications left eye at twice daily use over the next 2 months and follow-up here in 3 months.  2.  Patient should return to see Dr. Gershon Crane as scheduled  3.  Ophthalmic Meds Ordered this visit:  Meds ordered this encounter  Medications  . ketorolac (ACULAR) 0.5 % ophthalmic solution    Sig: Place 1 drop into the left eye 2 (two) times daily.    Dispense:  5 mL    Refill:  1       Return in about 3 months (around 07/19/2020) for dilate, OS, OCT.  There are no  Patient Instructions on file for this visit.   Explained the diagnoses, plan, and follow up with the patient and they expressed understanding.  Patient expressed understanding of the importance of proper follow up care.   Clent Demark Aryanne Gilleland M.D. Diseases & Surgery of the Retina and Vitreous Retina & Diabetic [place] 04/21/20     Abbreviations: M myopia (nearsighted); A astigmatism; H hyperopia (farsighted); P presbyopia; Mrx spectacle prescription;  CTL contact lenses; OD right eye; OS left eye; OU both eyes  XT exotropia; ET esotropia; PEK punctate epithelial keratitis; PEE punctate epithelial erosions; DES dry eye syndrome; MGD meibomian gland dysfunction; ATs artificial tears; PFAT's preservative free artificial tears; [place] nuclear sclerotic cataract; PSC posterior subcapsular cataract; ERM epi-retinal membrane; PVD posterior vitreous detachment; RD retinal detachment; DM diabetes mellitus; DR diabetic retinopathy; NPDR non-proliferative diabetic retinopathy; PDR proliferative diabetic retinopathy; CSME clinically significant macular edema; DME diabetic macular edema; dbh dot blot hemorrhages; CWS cotton wool spot; POAG primary open angle glaucoma; C/D cup-to-disc ratio; HVF humphrey visual field; GVF goldmann visual field; OCT optical coherence tomography; IOP intraocular pressure; BRVO Branch retinal vein occlusion; CRVO central retinal vein occlusion; CRAO central retinal artery occlusion; BRAO branch retinal artery occlusion; RT retinal tear; SB scleral buckle; PPV pars plana vitrectomy; VH Vitreous hemorrhage; PRP panretinal laser photocoagulation; IVK intravitreal kenalog; VMT vitreomacular traction; MH Macular hole;  NVD neovascularization of the disc; NVE neovascularization elsewhere; AREDS age related eye disease study; ARMD age related macular degeneration; POAG primary open angle glaucoma; EBMD epithelial/anterior basement membrane dystrophy; ACIOL anterior chamber intraocular lens; IOL  intraocular lens; PCIOL posterior chamber intraocular lens; Phaco/IOL phacoemulsification with intraocular lens placement; PRK photorefractive  keratectomy; LASIK laser assisted in situ keratomileusis; HTN hypertension; DM diabetes mellitus; COPD chronic obstructive pulmonary disease

## 2020-06-16 DIAGNOSIS — I129 Hypertensive chronic kidney disease with stage 1 through stage 4 chronic kidney disease, or unspecified chronic kidney disease: Secondary | ICD-10-CM | POA: Diagnosis not present

## 2020-06-16 DIAGNOSIS — Z6830 Body mass index (BMI) 30.0-30.9, adult: Secondary | ICD-10-CM | POA: Diagnosis not present

## 2020-06-16 DIAGNOSIS — N2581 Secondary hyperparathyroidism of renal origin: Secondary | ICD-10-CM | POA: Diagnosis not present

## 2020-06-16 DIAGNOSIS — E785 Hyperlipidemia, unspecified: Secondary | ICD-10-CM | POA: Diagnosis not present

## 2020-06-16 DIAGNOSIS — M3131 Wegener's granulomatosis with renal involvement: Secondary | ICD-10-CM | POA: Diagnosis not present

## 2020-06-16 DIAGNOSIS — K219 Gastro-esophageal reflux disease without esophagitis: Secondary | ICD-10-CM | POA: Diagnosis not present

## 2020-06-16 DIAGNOSIS — N179 Acute kidney failure, unspecified: Secondary | ICD-10-CM | POA: Diagnosis not present

## 2020-06-16 DIAGNOSIS — N183 Chronic kidney disease, stage 3 unspecified: Secondary | ICD-10-CM | POA: Diagnosis not present

## 2020-06-16 DIAGNOSIS — N189 Chronic kidney disease, unspecified: Secondary | ICD-10-CM | POA: Diagnosis not present

## 2020-06-16 DIAGNOSIS — D631 Anemia in chronic kidney disease: Secondary | ICD-10-CM | POA: Diagnosis not present

## 2020-07-21 ENCOUNTER — Encounter (INDEPENDENT_AMBULATORY_CARE_PROVIDER_SITE_OTHER): Payer: Medicare Other | Admitting: Ophthalmology

## 2020-07-30 DIAGNOSIS — Z23 Encounter for immunization: Secondary | ICD-10-CM | POA: Diagnosis not present

## 2020-08-06 ENCOUNTER — Encounter (INDEPENDENT_AMBULATORY_CARE_PROVIDER_SITE_OTHER): Payer: Self-pay | Admitting: Ophthalmology

## 2020-08-06 ENCOUNTER — Ambulatory Visit (INDEPENDENT_AMBULATORY_CARE_PROVIDER_SITE_OTHER): Payer: Medicare Other | Admitting: Ophthalmology

## 2020-08-06 ENCOUNTER — Other Ambulatory Visit: Payer: Self-pay

## 2020-08-06 DIAGNOSIS — H35352 Cystoid macular degeneration, left eye: Secondary | ICD-10-CM | POA: Diagnosis not present

## 2020-08-06 DIAGNOSIS — H43822 Vitreomacular adhesion, left eye: Secondary | ICD-10-CM

## 2020-08-06 NOTE — Progress Notes (Signed)
08/06/2020     CHIEF COMPLAINT Patient presents for Retina Follow Up (3 month fu OS and OCT/Pt states, "My va is good. I am not having any issues. My OS is not quite as good as OD but it isn't bad."/)   HISTORY OF PRESENT ILLNESS: Nicholas Hudson is a 80 y.o. male who presents to the clinic today for:   HPI    Retina Follow Up    Diagnosis: Other   Laterality: left eye   Onset: 3 months ago   Severity: mild   Duration: 3 months   Course: stable   Comments: 3 month fu OS and OCT Pt states, "My va is good. I am not having any issues. My OS is not quite as good as OD but it isn't bad."        Last edited by Kendra Opitz, COA on 08/06/2020  8:48 AM. (History)      Referring physician: Seward Carol, MD [address] [place],  [place] [address]  HISTORICAL INFORMATION:   Selected notes from the MEDICAL RECORD NUMBER    Lab Results  Component Value Date   HGBA1C 6.5 (H) 04/19/2016     CURRENT MEDICATIONS: No current outpatient medications on file. (Ophthalmic Drugs)   No current facility-administered medications for this visit. (Ophthalmic Drugs)   Current Outpatient Medications (Other)  Medication Sig  . allopurinol (ZYLOPRIM) 100 MG tablet Take 1 tablet (100 mg total) by mouth daily.  Marland Kitchen aspirin EC 81 MG tablet Take 81 mg by mouth daily.  . cholecalciferol (VITAMIN D) 1000 UNITS tablet Take 2,000 Units by mouth daily.  . cyanocobalamin 2000 MCG tablet Take 2,000 mcg by mouth daily.  . cyclophosphamide (CYTOXAN) 50 MG capsule Take 1 capsule (50 mg total) by mouth daily. Give on an empty stomach 1 hour before or 2 hours after meals. Follow up with nephrologist  . finasteride (PROSCAR) 5 MG tablet Take 5 mg by mouth daily.  Marland Kitchen gabapentin (NEURONTIN) 300 MG capsule Take 300 mg by mouth at bedtime.  Marland Kitchen glimepiride (AMARYL) 1 MG tablet Take 1 tablet (1 mg total) by mouth daily with breakfast.  . levothyroxine (SYNTHROID, LEVOTHROID) 200 MCG tablet Take 200  mcg by mouth daily before breakfast.   . Omega-3 Fatty Acids (FISH OIL) 1000 MG CAPS Take 1,000 mg by mouth daily.   . pantoprazole (PROTONIX) 40 MG tablet Take 1 tablet (40 mg total) by mouth 2 (two) times daily.  . polyethylene glycol (MIRALAX / GLYCOLAX) packet Take 17 g by mouth daily as needed.  . predniSONE (DELTASONE) 20 MG tablet Take 3 tablets (60 mg total) by mouth daily with breakfast. As per nephrologist, please follow up with Nephrologist in 1-2 weeks.  . rosuvastatin (CRESTOR) 20 MG tablet Take 0.5 tablets (10 mg total) by mouth daily.  . sodium bicarbonate 650 MG tablet Take 1 tablet (650 mg total) by mouth daily.  Marland Kitchen sulfamethoxazole-trimethoprim (BACTRIM DS,SEPTRA DS) 800-160 MG tablet Take 1 tablet by mouth every Monday, Wednesday, and Friday at 6 PM.   No current facility-administered medications for this visit. (Other)      REVIEW OF SYSTEMS:    ALLERGIES No Known Allergies  PAST MEDICAL HISTORY Past Medical History:  Diagnosis Date  . BPH (benign prostatic hyperplasia)   . Gout   . Hyperlipidemia   . Renal disorder   . Thyroid disease    Past Surgical History:  Procedure Laterality Date  . CATARACT EXTRACTION Bilateral  Dr. Gershon Crane  . COLONOSCOPY [name] 05/13/2016   Procedure: COLONOSCOPY;  Surgeon: Clarene Essex, MD;  Location: [facility] ENDOSCOPY;  Service: Endoscopy;  Laterality: [name];  . ESOPHAGOGASTRODUODENOSCOPY [name] 04/29/2016   Procedure: ESOPHAGOGASTRODUODENOSCOPY (EGD);  Surgeon: Laurence Spates, MD;  Location: [facility] ENDOSCOPY;  Service: Endoscopy;  Laterality: [name];  . ESOPHAGOGASTRODUODENOSCOPY [name] 05/07/2016   Procedure: ESOPHAGOGASTRODUODENOSCOPY (EGD);  Surgeon: Laurence Spates, MD;  Location: [facility] ENDOSCOPY;  Service: Endoscopy;  Laterality: [name];  . ESOPHAGOGASTRODUODENOSCOPY (EGD) WITH PROPOFOL [name] 05/12/2016   Procedure: ESOPHAGOGASTRODUODENOSCOPY (EGD) WITH PROPOFOL;  Surgeon: Clarene Essex, MD;  Location: [facility] ENDOSCOPY;  Service: Endoscopy;  Laterality: [name];  . IR  GENERIC HISTORICAL  04/22/2016   IR US GUIDE VASC ACCESS RIGHT 04/22/2016 Sandi Mariscal, MD [facility]  . IR GENERIC HISTORICAL  04/22/2016   IR FLUORO GUIDE CV LINE RIGHT 04/22/2016 Sandi Mariscal, MD [facility]  . IR GENERIC HISTORICAL  05/11/2016   IR REMOVAL TUN CV CATH W/O FL 05/11/2016 Markus Daft, MD [facility]    FAMILY HISTORY History reviewed. No pertinent family history.  SOCIAL HISTORY Social History   Tobacco Use  . Smoking status: Never Smoker  . Smokeless tobacco: Never Used  Substance Use Topics  . Alcohol use: No  . Drug use: No         OPHTHALMIC EXAM:  Base Eye Exam    Visual Acuity (ETDRS)      Right Left   Dist [place] 20/30 -1 20/25 -2   Dist ph [place] 20/25 +2        Tonometry (Tonopen, 8:52 AM)      Right Left   Pressure 10 13       Pupils      Dark Light Shape React APD   Right 2 1 Round Brisk None   Left 2 2 Irregular Sluggish None       Visual Fields (Counting fingers)      Left Right    Full Full       Extraocular Movement      Right Left    Full Full       Neuro/Psych    Oriented x3: Yes   Mood/Affect: Normal       Dilation    Left eye: 1.0% Mydriacyl, 2.5% Phenylephrine @ 8:52 AM        Slit Lamp and Fundus Exam    External Exam      Right Left   External Normal Normal       Slit Lamp Exam      Right Left   Lids/Lashes Normal Normal   Conjunctiva/Sclera White and quiet White and quiet   Cornea Clear Clear   Anterior Chamber Deep and quiet Deep and quiet   Iris Round and reactive Round and reactive   Lens Centered posterior chamber intraocular lens Centered posterior chamber intraocular lens   Anterior Vitreous Normal Normal       Fundus Exam      Right Left   Posterior Vitreous not dilated per pt Normal   Disc  Normal   C/D Ratio  0.25   Macula  Normal, no obvious CME   Vessels  Normal   Periphery  Normal          IMAGING AND PROCEDURES  Imaging and Procedures for 08/06/20  OCT, Retina - OU - Both Eyes        Right Eye Quality was good. Scan locations included subfoveal. Central Foveal Thickness: 271. Progression has been stable. Findings include  normal observations, normal foveal contour.   Left Eye Quality was good. Scan locations included subfoveal. Central Foveal Thickness: 312. Progression has improved. Findings include vitreomacular adhesion .   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  Onset of condition documented November 2021 some 2 years processed previous CE IOL  Patient instructed to stay off the topical medications as completed 1 month previous and do not mash compress or rub the eye      ICD-10-CM   1. Cystoid macular edema of left eye  H35.352 OCT, Retina - OU - Both Eyes  2. Vitreomacular traction syndrome, left  H43.822     1.  CME OS has resolved even with residual vitreomacular adhesion remaining.  2.  OS with late onset pseudophakic CME may have been causative by extensive VMA, VM traction yet this is still slightly persistent and the CME has resolved on topical NSAIDs alone.  More over have instructed patient not to compress mash or rub the eye  3.  Follow-up with Dr. Rutherford Guys as scheduled  Ophthalmic Meds Ordered this visit:  No orders of the defined types were placed in this encounter.      Return in about 1 year (around 08/06/2021) for DILATE OU, OCT, for history of VMA and CME OS.  There are no Patient Instructions on file for this visit.   Explained the diagnoses, plan, and follow up with the patient and they expressed understanding.  Patient  expressed understanding of the importance of proper follow up care.   Clent Demark Afia Messenger M.D. Diseases & Surgery of the Retina and Vitreous Retina & Diabetic [place] 08/06/20     Abbreviations: M myopia (nearsighted); A astigmatism; H hyperopia (farsighted); P presbyopia; Mrx spectacle prescription;  CTL contact lenses; OD right eye; OS left eye; OU both eyes  XT exotropia; ET esotropia; PEK punctate epithelial keratitis; PEE punctate epithelial erosions; DES dry eye syndrome; MGD meibomian gland dysfunction; ATs artificial tears; PFAT's preservative free artificial tears; [place] nuclear sclerotic cataract; PSC posterior subcapsular cataract; ERM epi-retinal membrane; PVD posterior vitreous detachment; RD retinal detachment; DM diabetes mellitus; DR diabetic retinopathy; NPDR non-proliferative diabetic retinopathy; PDR proliferative diabetic retinopathy; CSME clinically significant macular edema; DME diabetic macular edema; dbh dot blot hemorrhages; CWS cotton wool spot; POAG primary open angle glaucoma; C/D cup-to-disc ratio; HVF humphrey visual field; GVF goldmann visual field; OCT optical coherence tomography; IOP intraocular pressure; BRVO Branch retinal vein occlusion; CRVO central retinal vein occlusion; CRAO central retinal artery occlusion; BRAO branch retinal artery occlusion; RT retinal tear; SB scleral buckle; PPV pars plana vitrectomy; VH Vitreous hemorrhage; PRP panretinal laser photocoagulation; IVK intravitreal kenalog; VMT vitreomacular traction; MH Macular hole;  NVD neovascularization of the disc; NVE neovascularization elsewhere; AREDS age related eye disease study; ARMD age related macular degeneration; POAG primary open angle glaucoma; EBMD epithelial/anterior basement membrane dystrophy; ACIOL anterior chamber intraocular lens; IOL intraocular lens; PCIOL posterior chamber intraocular lens; Phaco/IOL phacoemulsification with intraocular lens placement; [place] photorefractive keratectomy;  LASIK laser assisted in situ keratomileusis; HTN hypertension; DM diabetes mellitus; COPD  chronic obstructive pulmonary disease

## 2020-08-06 NOTE — Assessment & Plan Note (Signed)
Minor, persists, with resolution of CME with topical medical therapy

## 2020-08-06 NOTE — Assessment & Plan Note (Addendum)
Off of topical medical therapy left eye for some 1 month, when medication was completed  Notably late onset CME OS poss related to VMA VMT OS.  Onset of condition documented November 2021 some 2 years processed previous CE IOL  Patient instructed to stay off the topical medications as completed 1 month previous and do not mash compress or rub the eye

## 2020-08-07 DIAGNOSIS — N1831 Chronic kidney disease, stage 3a: Secondary | ICD-10-CM | POA: Diagnosis not present

## 2020-08-07 DIAGNOSIS — Z0000 Encounter for general adult medical examination without abnormal findings: Secondary | ICD-10-CM | POA: Diagnosis not present

## 2020-08-07 DIAGNOSIS — Z1389 Encounter for screening for other disorder: Secondary | ICD-10-CM | POA: Diagnosis not present

## 2020-08-07 DIAGNOSIS — G4730 Sleep apnea, unspecified: Secondary | ICD-10-CM | POA: Diagnosis not present

## 2020-08-07 DIAGNOSIS — E1169 Type 2 diabetes mellitus with other specified complication: Secondary | ICD-10-CM | POA: Diagnosis not present

## 2020-08-07 DIAGNOSIS — R7301 Impaired fasting glucose: Secondary | ICD-10-CM | POA: Diagnosis not present

## 2020-08-07 DIAGNOSIS — E7800 Pure hypercholesterolemia, unspecified: Secondary | ICD-10-CM | POA: Diagnosis not present

## 2020-08-07 DIAGNOSIS — M3131 Wegener's granulomatosis with renal involvement: Secondary | ICD-10-CM | POA: Diagnosis not present

## 2020-08-07 DIAGNOSIS — E039 Hypothyroidism, unspecified: Secondary | ICD-10-CM | POA: Diagnosis not present

## 2020-08-21 DIAGNOSIS — E039 Hypothyroidism, unspecified: Secondary | ICD-10-CM | POA: Diagnosis not present

## 2020-09-25 DIAGNOSIS — B351 Tinea unguium: Secondary | ICD-10-CM | POA: Diagnosis not present

## 2020-09-25 DIAGNOSIS — L503 Dermatographic urticaria: Secondary | ICD-10-CM | POA: Diagnosis not present

## 2020-09-25 DIAGNOSIS — L905 Scar conditions and fibrosis of skin: Secondary | ICD-10-CM | POA: Diagnosis not present

## 2020-09-25 DIAGNOSIS — L821 Other seborrheic keratosis: Secondary | ICD-10-CM | POA: Diagnosis not present

## 2020-09-25 DIAGNOSIS — D1801 Hemangioma of skin and subcutaneous tissue: Secondary | ICD-10-CM | POA: Diagnosis not present

## 2020-09-25 DIAGNOSIS — Z85820 Personal history of malignant melanoma of skin: Secondary | ICD-10-CM | POA: Diagnosis not present

## 2020-09-25 DIAGNOSIS — L308 Other specified dermatitis: Secondary | ICD-10-CM | POA: Diagnosis not present

## 2020-09-25 DIAGNOSIS — L814 Other melanin hyperpigmentation: Secondary | ICD-10-CM | POA: Diagnosis not present

## 2020-09-25 DIAGNOSIS — L218 Other seborrheic dermatitis: Secondary | ICD-10-CM | POA: Diagnosis not present

## 2020-09-25 DIAGNOSIS — I8393 Asymptomatic varicose veins of bilateral lower extremities: Secondary | ICD-10-CM | POA: Diagnosis not present

## 2020-09-25 DIAGNOSIS — L819 Disorder of pigmentation, unspecified: Secondary | ICD-10-CM | POA: Diagnosis not present

## 2020-10-13 DIAGNOSIS — Z6830 Body mass index (BMI) 30.0-30.9, adult: Secondary | ICD-10-CM | POA: Diagnosis not present

## 2020-10-13 DIAGNOSIS — I129 Hypertensive chronic kidney disease with stage 1 through stage 4 chronic kidney disease, or unspecified chronic kidney disease: Secondary | ICD-10-CM | POA: Diagnosis not present

## 2020-10-13 DIAGNOSIS — N183 Chronic kidney disease, stage 3 unspecified: Secondary | ICD-10-CM | POA: Diagnosis not present

## 2020-10-13 DIAGNOSIS — N2581 Secondary hyperparathyroidism of renal origin: Secondary | ICD-10-CM | POA: Diagnosis not present

## 2020-10-13 DIAGNOSIS — M3131 Wegener's granulomatosis with renal involvement: Secondary | ICD-10-CM | POA: Diagnosis not present

## 2020-10-13 DIAGNOSIS — N189 Chronic kidney disease, unspecified: Secondary | ICD-10-CM | POA: Diagnosis not present

## 2020-10-13 DIAGNOSIS — D631 Anemia in chronic kidney disease: Secondary | ICD-10-CM | POA: Diagnosis not present

## 2020-10-13 DIAGNOSIS — K219 Gastro-esophageal reflux disease without esophagitis: Secondary | ICD-10-CM | POA: Diagnosis not present

## 2020-10-13 DIAGNOSIS — E785 Hyperlipidemia, unspecified: Secondary | ICD-10-CM | POA: Diagnosis not present

## 2020-10-13 DIAGNOSIS — N179 Acute kidney failure, unspecified: Secondary | ICD-10-CM | POA: Diagnosis not present

## 2020-12-28 DIAGNOSIS — Z23 Encounter for immunization: Secondary | ICD-10-CM | POA: Diagnosis not present

## 2021-01-19 DIAGNOSIS — N138 Other obstructive and reflux uropathy: Secondary | ICD-10-CM | POA: Diagnosis not present

## 2021-01-19 DIAGNOSIS — N529 Male erectile dysfunction, unspecified: Secondary | ICD-10-CM | POA: Diagnosis not present

## 2021-01-19 DIAGNOSIS — Z23 Encounter for immunization: Secondary | ICD-10-CM | POA: Diagnosis not present

## 2021-01-19 DIAGNOSIS — N401 Enlarged prostate with lower urinary tract symptoms: Secondary | ICD-10-CM | POA: Diagnosis not present

## 2021-02-08 DIAGNOSIS — E039 Hypothyroidism, unspecified: Secondary | ICD-10-CM | POA: Diagnosis not present

## 2021-02-08 DIAGNOSIS — N1831 Chronic kidney disease, stage 3a: Secondary | ICD-10-CM | POA: Diagnosis not present

## 2021-02-08 DIAGNOSIS — N059 Unspecified nephritic syndrome with unspecified morphologic changes: Secondary | ICD-10-CM | POA: Diagnosis not present

## 2021-02-08 DIAGNOSIS — E1169 Type 2 diabetes mellitus with other specified complication: Secondary | ICD-10-CM | POA: Diagnosis not present

## 2021-02-08 DIAGNOSIS — E7800 Pure hypercholesterolemia, unspecified: Secondary | ICD-10-CM | POA: Diagnosis not present

## 2021-07-20 DIAGNOSIS — I7782 Antineutrophilic cytoplasmic antibody (ANCA) vasculitis: Secondary | ICD-10-CM | POA: Diagnosis not present

## 2021-07-20 DIAGNOSIS — N183 Chronic kidney disease, stage 3 unspecified: Secondary | ICD-10-CM | POA: Diagnosis not present

## 2021-07-20 DIAGNOSIS — I129 Hypertensive chronic kidney disease with stage 1 through stage 4 chronic kidney disease, or unspecified chronic kidney disease: Secondary | ICD-10-CM | POA: Diagnosis not present

## 2021-08-10 DIAGNOSIS — E039 Hypothyroidism, unspecified: Secondary | ICD-10-CM | POA: Diagnosis not present

## 2021-08-10 DIAGNOSIS — M3131 Wegener's granulomatosis with renal involvement: Secondary | ICD-10-CM | POA: Diagnosis not present

## 2021-08-10 DIAGNOSIS — E7800 Pure hypercholesterolemia, unspecified: Secondary | ICD-10-CM | POA: Diagnosis not present

## 2021-08-10 DIAGNOSIS — E1169 Type 2 diabetes mellitus with other specified complication: Secondary | ICD-10-CM | POA: Diagnosis not present

## 2021-08-10 DIAGNOSIS — Z1331 Encounter for screening for depression: Secondary | ICD-10-CM | POA: Diagnosis not present

## 2021-08-10 DIAGNOSIS — Z0000 Encounter for general adult medical examination without abnormal findings: Secondary | ICD-10-CM | POA: Diagnosis not present

## 2021-08-10 DIAGNOSIS — N1831 Chronic kidney disease, stage 3a: Secondary | ICD-10-CM | POA: Diagnosis not present

## 2021-08-12 ENCOUNTER — Encounter (INDEPENDENT_AMBULATORY_CARE_PROVIDER_SITE_OTHER): Payer: Medicare Other | Admitting: Ophthalmology

## 2021-08-19 ENCOUNTER — Ambulatory Visit (INDEPENDENT_AMBULATORY_CARE_PROVIDER_SITE_OTHER): Payer: PPO | Admitting: Ophthalmology

## 2021-08-19 ENCOUNTER — Encounter (INDEPENDENT_AMBULATORY_CARE_PROVIDER_SITE_OTHER): Payer: Self-pay | Admitting: Ophthalmology

## 2021-08-19 DIAGNOSIS — H35352 Cystoid macular degeneration, left eye: Secondary | ICD-10-CM

## 2021-08-19 DIAGNOSIS — H43822 Vitreomacular adhesion, left eye: Secondary | ICD-10-CM

## 2021-08-19 NOTE — Assessment & Plan Note (Signed)
OS now off topicals NSAIDs with only minor recurrent CME.  We will ask patient to follow-up in 6 months simply to reassess to be sure that the macular edema does not recur.  He is actually instructed also not to compress mash or rub the eye, because of the central for iris chafe is triggering intraocular inflammation

## 2021-08-19 NOTE — Assessment & Plan Note (Signed)
Condition resolved spontaneously OS, will continue to observe.

## 2021-08-19 NOTE — Progress Notes (Signed)
08/19/2021     CHIEF COMPLAINT Patient presents for  Chief Complaint  Patient presents with   Cystoid Macular Edema      HISTORY OF PRESENT ILLNESS: Nicholas Hudson is a 81 y.o. male who presents to the clinic today for:   HPI   1 yr fu OU, OCT. Patient states vision is stable and unchanged since last visit. Denies any new floaters or FOL. Patient reports "I haven't been to my regular doctor, Dr. Gershon Crane, I missed that appointment. He told me not to rub my eyes but I have been rubbing my eyes a little bit, once in a while they itch."  Last edited by Laurin Coder on 08/19/2021  8:20 AM.      Referring physician: Rutherford Guys, [place],  [place] [id number]  HISTORICAL INFORMATION:   Selected notes from the MEDICAL RECORD NUMBER    Lab Results  Component Value Date   HGBA1C 6.5 (H) 04/19/2016     CURRENT MEDICATIONS: No current outpatient medications on file. (Ophthalmic Drugs)   No current facility-administered medications for this visit. (Ophthalmic Drugs)   Current Outpatient Medications (Other)  Medication Sig   allopurinol (ZYLOPRIM) 100 MG tablet Take 1 tablet (100 mg total) by mouth daily.   aspirin EC 81 MG tablet Take 81 mg by mouth daily.   cholecalciferol (VITAMIN D) 1000 UNITS tablet Take 2,000 Units by mouth daily.   cyanocobalamin 2000 MCG tablet Take 2,000 mcg by mouth daily.   cyclophosphamide (CYTOXAN) 50 MG capsule Take 1 capsule (50 mg total) by mouth daily. Give on an empty stomach 1 hour before or 2 hours after meals. Follow up with nephrologist   finasteride (PROSCAR) 5 MG tablet Take 5 mg by mouth daily.   gabapentin (NEURONTIN) 300 MG capsule Take 300 mg by mouth at bedtime.   glimepiride (AMARYL) 1 MG tablet Take 1 tablet (1 mg total) by mouth daily with breakfast.   levothyroxine (SYNTHROID, LEVOTHROID) 200 MCG tablet Take 200 mcg by mouth daily before breakfast.    Omega-3 Fatty Acids (FISH OIL) 1000 MG CAPS Take  1,000 mg by mouth daily.    pantoprazole (PROTONIX) 40 MG tablet Take 1 tablet (40 mg total) by mouth 2 (two) times daily.   polyethylene glycol (MIRALAX / GLYCOLAX) packet Take 17 g by mouth daily as needed.   predniSONE (DELTASONE) 20 MG tablet Take 3 tablets (60 mg total) by mouth daily with breakfast. As per nephrologist, please follow up with Nephrologist in 1-2 weeks.   rosuvastatin (CRESTOR) 20 MG tablet Take 0.5 tablets (10 mg total) by mouth daily.   sodium bicarbonate 650 MG tablet Take 1 tablet (650 mg total) by mouth daily.   sulfamethoxazole-trimethoprim (BACTRIM DS,SEPTRA DS) 800-160 MG tablet Take 1 tablet by mouth every Monday, Wednesday, and Friday at 6 PM.   No current facility-administered medications for this visit. (Other)      REVIEW OF SYSTEMS: ROS   Negative for: Constitutional, Gastrointestinal, Neurological, Skin, Genitourinary, Musculoskeletal, HENT, Endocrine, Cardiovascular, Eyes, Respiratory, Psychiatric, Allergic/Imm, Heme/Lymph Last edited by Hurman Horn, MD on 08/19/2021  8:42 AM.       ALLERGIES No Known Allergies  PAST MEDICAL HISTORY Past Medical History:  Diagnosis Date   BPH (benign prostatic hyperplasia)    Gout    Hyperlipidemia    Renal disorder    Thyroid disease    Past Surgical History:  Procedure Laterality Date   CATARACT EXTRACTION Bilateral  Dr. Gershon Crane   COLONOSCOPY [name] 05/13/2016   Procedure: COLONOSCOPY;  Surgeon: Clarene Essex, MD;  Location: [facility] ENDOSCOPY;  Service: Endoscopy;  Laterality: [name];   ESOPHAGOGASTRODUODENOSCOPY [name] 04/29/2016   Procedure: ESOPHAGOGASTRODUODENOSCOPY (EGD);  Surgeon: Laurence Spates, MD;  Location: [facility] ENDOSCOPY;  Service: Endoscopy;  Laterality: [name];   ESOPHAGOGASTRODUODENOSCOPY [name] 05/07/2016   Procedure: ESOPHAGOGASTRODUODENOSCOPY (EGD);  Surgeon: Laurence Spates, MD;  Location: [facility] ENDOSCOPY;  Service: Endoscopy;  Laterality: [name];   ESOPHAGOGASTRODUODENOSCOPY (EGD) WITH PROPOFOL [name] 05/12/2016    Procedure: ESOPHAGOGASTRODUODENOSCOPY (EGD) WITH PROPOFOL;  Surgeon: Clarene Essex, MD;  Location: [facility] ENDOSCOPY;  Service: Endoscopy;  Laterality: [name];   IR GENERIC HISTORICAL  04/22/2016   IR US GUIDE VASC ACCESS RIGHT 04/22/2016 Sandi Mariscal, MD [facility]   IR GENERIC HISTORICAL  04/22/2016   IR FLUORO GUIDE CV LINE RIGHT 04/22/2016 Sandi Mariscal, MD [facility]   IR GENERIC HISTORICAL  05/11/2016   IR REMOVAL TUN CV CATH W/O FL 05/11/2016 Markus Daft, MD [facility]    FAMILY HISTORY History reviewed. No pertinent family history.  SOCIAL HISTORY Social History   Tobacco Use   Smoking status: Never   Smokeless tobacco: Never  Substance Use Topics   Alcohol use: No   Drug use: No         OPHTHALMIC EXAM:  Base Eye Exam     Visual Acuity (ETDRS)       Right Left   Dist [place] 20/25 +2 20/30 -2   Dist ph   NI         Tonometry (Tonopen, 8:23 AM)       Right Left   Pressure 14 14         Pupils       Pupils Dark Light APD   Right PERRL 3 2 None   Left PERRL 3 2 None         Visual Fields (Counting fingers)       Left Right    Full Full         Extraocular Movement       Right Left    Full Full         Neuro/Psych     Oriented x3: Yes   Mood/Affect: Normal         Dilation     Both eyes: 1.0% Mydriacyl, 2.5% Phenylephrine @ 8:23 AM           Slit Lamp and Fundus Exam     External Exam       Right Left   External Normal Normal         Slit Lamp Exam       Right Left   Lids/Lashes Normal Normal   Conjunctiva/Sclera White and quiet White and quiet   Cornea Clear Clear   Anterior Chamber Deep and quiet Deep and quiet   Iris Round and reactive Round and reactive   Lens Centered posterior chamber intraocular lens Centered posterior chamber intraocular lens   Anterior Vitreous Normal Normal         Fundus Exam       Right Left   Posterior Vitreous Normal Normal   Disc Normal Normal   C/D Ratio 0.25 0.25   Macula Normal  Normal, no obvious CME clinically   Vessels Normal Normal   Periphery Normal Normal            IMAGING AND PROCEDURES  Imaging and Procedures for 08/19/21  OCT, Retina - OU - Both Eyes  Right Eye Quality was good. Scan locations included subfoveal. Central Foveal Thickness: 267. Progression has been stable. Findings include normal observations, normal foveal contour.   Left Eye Quality was good. Scan locations included subfoveal. Central Foveal Thickness: 326. Progression has improved. Findings include vitreomacular adhesion .   Notes OS with nearly resolved, slightly recurrent in the perifoveal area but not looking active, CME on examination today these findings were displayed and reviewed with the patient incidentally patient has developed PVD OS               ASSESSMENT/PLAN:  Vitreomacular traction syndrome, left Condition resolved spontaneously OS, will continue to observe.  Cystoid macular edema of left eye OS now off topicals NSAIDs with only minor recurrent CME.  We will ask patient to follow-up in 6 months simply to reassess to be sure that the macular edema does not recur.  He is actually instructed also not to compress mash or rub the eye, because of the central for iris chafe is triggering intraocular inflammation     ICD-10-CM   1. Cystoid macular edema of left eye  H35.352 OCT, Retina - OU - Both Eyes    2. Vitreomacular traction syndrome, left  H43.822       1.  OS spontaneous resolution of VMT.  2.  OS, with some slight recurrence of CME off of NSAIDs.  Although this looks chronic and not affecting acuity we will monitor closely follow-up next in 6 months.  If visual acuity declines in the meantime patient should contact the office for reevaluation  3.  Ophthalmic Meds Ordered this visit:  No orders of the defined types were placed in this encounter.      Return in about 6 months (around 02/18/2022) for DILATE OU, OCT.  There are no  Patient Instructions on file for this visit.   Explained the diagnoses, plan, and follow up with the patient and they expressed understanding.  Patient expressed understanding of the importance of proper follow up care.   Clent Demark Gerome Kokesh M.D. Diseases & Surgery of the Retina and Vitreous [facility] [place] 08/19/21     Abbreviations: M myopia (nearsighted); A astigmatism; H hyperopia (farsighted); P presbyopia; Mrx spectacle prescription;  CTL contact lenses; OD right eye; OS left eye; OU both eyes  XT exotropia; ET esotropia; PEK punctate epithelial keratitis; PEE punctate epithelial erosions; DES dry eye syndrome; MGD meibomian gland dysfunction; ATs artificial tears; PFAT's preservative free artificial tears; [place] nuclear sclerotic cataract; PSC posterior subcapsular cataract; ERM epi-retinal membrane; PVD posterior vitreous detachment; RD retinal detachment; DM diabetes mellitus; DR diabetic retinopathy; NPDR non-proliferative diabetic retinopathy; PDR proliferative diabetic retinopathy; CSME clinically significant macular edema; DME diabetic macular edema; dbh dot blot hemorrhages; CWS cotton wool spot; POAG primary open angle glaucoma; C/D cup-to-disc ratio; HVF humphrey visual field; GVF goldmann visual field; OCT optical coherence tomography; IOP intraocular pressure; BRVO Branch retinal vein occlusion; CRVO central retinal vein occlusion; CRAO central retinal artery occlusion; BRAO branch retinal artery occlusion; RT retinal tear; SB scleral buckle; PPV pars plana vitrectomy; VH Vitreous hemorrhage; PRP panretinal laser photocoagulation; IVK intravitreal kenalog; VMT vitreomacular traction; MH Macular hole;  NVD neovascularization of the disc; NVE neovascularization elsewhere; AREDS age related eye disease study; ARMD age related macular degeneration; POAG primary open angle glaucoma; EBMD epithelial/anterior basement membrane dystrophy; ACIOL anterior chamber intraocular lens; IOL  intraocular lens; PCIOL posterior chamber intraocular lens; Phaco/IOL phacoemulsification with intraocular lens placement; PRK photorefractive keratectomy; LASIK laser assisted in situ  keratomileusis; HTN hypertension; DM diabetes mellitus; COPD chronic obstructive pulmonary disease

## 2021-09-28 DIAGNOSIS — Z85820 Personal history of malignant melanoma of skin: Secondary | ICD-10-CM | POA: Diagnosis not present

## 2021-09-28 DIAGNOSIS — D485 Neoplasm of uncertain behavior of skin: Secondary | ICD-10-CM | POA: Diagnosis not present

## 2021-09-28 DIAGNOSIS — L218 Other seborrheic dermatitis: Secondary | ICD-10-CM | POA: Diagnosis not present

## 2021-09-28 DIAGNOSIS — Z08 Encounter for follow-up examination after completed treatment for malignant neoplasm: Secondary | ICD-10-CM | POA: Diagnosis not present

## 2021-09-28 DIAGNOSIS — L814 Other melanin hyperpigmentation: Secondary | ICD-10-CM | POA: Diagnosis not present

## 2021-09-28 DIAGNOSIS — D225 Melanocytic nevi of trunk: Secondary | ICD-10-CM | POA: Diagnosis not present

## 2021-09-28 DIAGNOSIS — L821 Other seborrheic keratosis: Secondary | ICD-10-CM | POA: Diagnosis not present

## 2022-01-12 DIAGNOSIS — G4733 Obstructive sleep apnea (adult) (pediatric): Secondary | ICD-10-CM | POA: Diagnosis not present

## 2022-01-18 DIAGNOSIS — N183 Chronic kidney disease, stage 3 unspecified: Secondary | ICD-10-CM | POA: Diagnosis not present

## 2022-01-18 DIAGNOSIS — I7782 Antineutrophilic cytoplasmic antibody (ANCA) vasculitis: Secondary | ICD-10-CM | POA: Diagnosis not present

## 2022-01-18 DIAGNOSIS — I129 Hypertensive chronic kidney disease with stage 1 through stage 4 chronic kidney disease, or unspecified chronic kidney disease: Secondary | ICD-10-CM | POA: Diagnosis not present

## 2022-01-18 DIAGNOSIS — N390 Urinary tract infection, site not specified: Secondary | ICD-10-CM | POA: Diagnosis not present

## 2022-01-20 DIAGNOSIS — N138 Other obstructive and reflux uropathy: Secondary | ICD-10-CM | POA: Diagnosis not present

## 2022-01-20 DIAGNOSIS — Z79899 Other long term (current) drug therapy: Secondary | ICD-10-CM | POA: Diagnosis not present

## 2022-01-20 DIAGNOSIS — Z23 Encounter for immunization: Secondary | ICD-10-CM | POA: Diagnosis not present

## 2022-01-20 DIAGNOSIS — N401 Enlarged prostate with lower urinary tract symptoms: Secondary | ICD-10-CM | POA: Diagnosis not present

## 2022-02-15 DIAGNOSIS — N1831 Chronic kidney disease, stage 3a: Secondary | ICD-10-CM | POA: Diagnosis not present

## 2022-02-15 DIAGNOSIS — E1169 Type 2 diabetes mellitus with other specified complication: Secondary | ICD-10-CM | POA: Diagnosis not present

## 2022-02-15 DIAGNOSIS — E7800 Pure hypercholesterolemia, unspecified: Secondary | ICD-10-CM | POA: Diagnosis not present

## 2022-02-15 DIAGNOSIS — M79673 Pain in unspecified foot: Secondary | ICD-10-CM | POA: Diagnosis not present

## 2022-02-15 DIAGNOSIS — E039 Hypothyroidism, unspecified: Secondary | ICD-10-CM | POA: Diagnosis not present

## 2022-02-21 ENCOUNTER — Encounter (INDEPENDENT_AMBULATORY_CARE_PROVIDER_SITE_OTHER): Payer: PPO | Admitting: Ophthalmology

## 2022-04-06 DIAGNOSIS — G4733 Obstructive sleep apnea (adult) (pediatric): Secondary | ICD-10-CM | POA: Diagnosis not present

## 2022-04-06 DIAGNOSIS — N1831 Chronic kidney disease, stage 3a: Secondary | ICD-10-CM | POA: Diagnosis not present

## 2022-04-06 DIAGNOSIS — M3131 Wegener's granulomatosis with renal involvement: Secondary | ICD-10-CM | POA: Diagnosis not present

## 2022-04-25 DIAGNOSIS — G4733 Obstructive sleep apnea (adult) (pediatric): Secondary | ICD-10-CM | POA: Diagnosis not present

## 2022-04-29 DIAGNOSIS — G4733 Obstructive sleep apnea (adult) (pediatric): Secondary | ICD-10-CM | POA: Diagnosis not present

## 2022-06-06 DIAGNOSIS — H524 Presbyopia: Secondary | ICD-10-CM | POA: Diagnosis not present

## 2022-06-06 DIAGNOSIS — Z961 Presence of intraocular lens: Secondary | ICD-10-CM | POA: Diagnosis not present

## 2022-06-06 DIAGNOSIS — H35352 Cystoid macular degeneration, left eye: Secondary | ICD-10-CM | POA: Diagnosis not present

## 2022-07-18 DIAGNOSIS — N183 Chronic kidney disease, stage 3 unspecified: Secondary | ICD-10-CM | POA: Diagnosis not present

## 2022-08-11 DIAGNOSIS — I7782 Antineutrophilic cytoplasmic antibody (ANCA) vasculitis: Secondary | ICD-10-CM | POA: Diagnosis not present

## 2022-08-11 DIAGNOSIS — N183 Chronic kidney disease, stage 3 unspecified: Secondary | ICD-10-CM | POA: Diagnosis not present

## 2022-08-11 DIAGNOSIS — I129 Hypertensive chronic kidney disease with stage 1 through stage 4 chronic kidney disease, or unspecified chronic kidney disease: Secondary | ICD-10-CM | POA: Diagnosis not present

## 2022-08-18 DIAGNOSIS — M3131 Wegener's granulomatosis with renal involvement: Secondary | ICD-10-CM | POA: Diagnosis not present

## 2022-08-18 DIAGNOSIS — Z1331 Encounter for screening for depression: Secondary | ICD-10-CM | POA: Diagnosis not present

## 2022-08-18 DIAGNOSIS — Z23 Encounter for immunization: Secondary | ICD-10-CM | POA: Diagnosis not present

## 2022-08-18 DIAGNOSIS — E0822 Diabetes mellitus due to underlying condition with diabetic chronic kidney disease: Secondary | ICD-10-CM | POA: Diagnosis not present

## 2022-08-18 DIAGNOSIS — G4733 Obstructive sleep apnea (adult) (pediatric): Secondary | ICD-10-CM | POA: Diagnosis not present

## 2022-08-18 DIAGNOSIS — E7800 Pure hypercholesterolemia, unspecified: Secondary | ICD-10-CM | POA: Diagnosis not present

## 2022-08-18 DIAGNOSIS — E1122 Type 2 diabetes mellitus with diabetic chronic kidney disease: Secondary | ICD-10-CM | POA: Diagnosis not present

## 2022-08-18 DIAGNOSIS — N1831 Chronic kidney disease, stage 3a: Secondary | ICD-10-CM | POA: Diagnosis not present

## 2022-08-18 DIAGNOSIS — Z9989 Dependence on other enabling machines and devices: Secondary | ICD-10-CM | POA: Diagnosis not present

## 2022-08-18 DIAGNOSIS — E039 Hypothyroidism, unspecified: Secondary | ICD-10-CM | POA: Diagnosis not present

## 2022-08-18 DIAGNOSIS — Z0000 Encounter for general adult medical examination without abnormal findings: Secondary | ICD-10-CM | POA: Diagnosis not present

## 2022-09-29 DIAGNOSIS — L728 Other follicular cysts of the skin and subcutaneous tissue: Secondary | ICD-10-CM | POA: Diagnosis not present

## 2022-09-29 DIAGNOSIS — L814 Other melanin hyperpigmentation: Secondary | ICD-10-CM | POA: Diagnosis not present

## 2022-09-29 DIAGNOSIS — D225 Melanocytic nevi of trunk: Secondary | ICD-10-CM | POA: Diagnosis not present

## 2022-09-29 DIAGNOSIS — L821 Other seborrheic keratosis: Secondary | ICD-10-CM | POA: Diagnosis not present

## 2022-09-29 DIAGNOSIS — M10072 Idiopathic gout, left ankle and foot: Secondary | ICD-10-CM | POA: Diagnosis not present

## 2022-09-29 DIAGNOSIS — Z08 Encounter for follow-up examination after completed treatment for malignant neoplasm: Secondary | ICD-10-CM | POA: Diagnosis not present

## 2022-09-29 DIAGNOSIS — Z85820 Personal history of malignant melanoma of skin: Secondary | ICD-10-CM | POA: Diagnosis not present

## 2023-01-25 DIAGNOSIS — G4733 Obstructive sleep apnea (adult) (pediatric): Secondary | ICD-10-CM | POA: Diagnosis not present

## 2023-01-26 DIAGNOSIS — N401 Enlarged prostate with lower urinary tract symptoms: Secondary | ICD-10-CM | POA: Diagnosis not present

## 2023-01-26 DIAGNOSIS — N400 Enlarged prostate without lower urinary tract symptoms: Secondary | ICD-10-CM | POA: Diagnosis not present

## 2023-01-26 DIAGNOSIS — R972 Elevated prostate specific antigen [PSA]: Secondary | ICD-10-CM | POA: Diagnosis not present

## 2023-01-26 DIAGNOSIS — N138 Other obstructive and reflux uropathy: Secondary | ICD-10-CM | POA: Diagnosis not present

## 2023-02-16 DIAGNOSIS — N1831 Chronic kidney disease, stage 3a: Secondary | ICD-10-CM | POA: Diagnosis not present

## 2023-02-16 DIAGNOSIS — E7800 Pure hypercholesterolemia, unspecified: Secondary | ICD-10-CM | POA: Diagnosis not present

## 2023-02-16 DIAGNOSIS — M3131 Wegener's granulomatosis with renal involvement: Secondary | ICD-10-CM | POA: Diagnosis not present

## 2023-02-16 DIAGNOSIS — Z1211 Encounter for screening for malignant neoplasm of colon: Secondary | ICD-10-CM | POA: Diagnosis not present

## 2023-02-16 DIAGNOSIS — E039 Hypothyroidism, unspecified: Secondary | ICD-10-CM | POA: Diagnosis not present

## 2023-02-16 DIAGNOSIS — R7301 Impaired fasting glucose: Secondary | ICD-10-CM | POA: Diagnosis not present

## 2023-02-16 DIAGNOSIS — M109 Gout, unspecified: Secondary | ICD-10-CM | POA: Diagnosis not present

## 2023-02-28 DIAGNOSIS — G4733 Obstructive sleep apnea (adult) (pediatric): Secondary | ICD-10-CM | POA: Diagnosis not present

## 2023-03-01 DIAGNOSIS — G4733 Obstructive sleep apnea (adult) (pediatric): Secondary | ICD-10-CM | POA: Diagnosis not present

## 2023-05-30 DIAGNOSIS — G4733 Obstructive sleep apnea (adult) (pediatric): Secondary | ICD-10-CM | POA: Diagnosis not present

## 2023-05-31 DIAGNOSIS — G4733 Obstructive sleep apnea (adult) (pediatric): Secondary | ICD-10-CM | POA: Diagnosis not present

## 2023-06-14 DIAGNOSIS — H43813 Vitreous degeneration, bilateral: Secondary | ICD-10-CM | POA: Diagnosis not present

## 2023-06-14 DIAGNOSIS — H35352 Cystoid macular degeneration, left eye: Secondary | ICD-10-CM | POA: Diagnosis not present

## 2023-06-14 DIAGNOSIS — H524 Presbyopia: Secondary | ICD-10-CM | POA: Diagnosis not present

## 2023-06-14 DIAGNOSIS — H538 Other visual disturbances: Secondary | ICD-10-CM | POA: Diagnosis not present

## 2023-06-14 DIAGNOSIS — H1045 Other chronic allergic conjunctivitis: Secondary | ICD-10-CM | POA: Diagnosis not present

## 2023-06-30 DIAGNOSIS — G4733 Obstructive sleep apnea (adult) (pediatric): Secondary | ICD-10-CM | POA: Diagnosis not present

## 2023-07-30 DIAGNOSIS — G4733 Obstructive sleep apnea (adult) (pediatric): Secondary | ICD-10-CM | POA: Diagnosis not present

## 2023-08-08 DIAGNOSIS — N184 Chronic kidney disease, stage 4 (severe): Secondary | ICD-10-CM | POA: Diagnosis not present

## 2023-08-16 DIAGNOSIS — N1831 Chronic kidney disease, stage 3a: Secondary | ICD-10-CM | POA: Diagnosis not present

## 2023-08-16 DIAGNOSIS — R809 Proteinuria, unspecified: Secondary | ICD-10-CM | POA: Diagnosis not present

## 2023-08-16 DIAGNOSIS — I129 Hypertensive chronic kidney disease with stage 1 through stage 4 chronic kidney disease, or unspecified chronic kidney disease: Secondary | ICD-10-CM | POA: Diagnosis not present

## 2023-08-16 DIAGNOSIS — I7782 Antineutrophilic cytoplasmic antibody (ANCA) vasculitis: Secondary | ICD-10-CM | POA: Diagnosis not present

## 2023-08-30 DIAGNOSIS — G4733 Obstructive sleep apnea (adult) (pediatric): Secondary | ICD-10-CM | POA: Diagnosis not present

## 2023-09-29 DIAGNOSIS — Z85820 Personal history of malignant melanoma of skin: Secondary | ICD-10-CM | POA: Diagnosis not present

## 2023-09-29 DIAGNOSIS — D225 Melanocytic nevi of trunk: Secondary | ICD-10-CM | POA: Diagnosis not present

## 2023-09-29 DIAGNOSIS — G4733 Obstructive sleep apnea (adult) (pediatric): Secondary | ICD-10-CM | POA: Diagnosis not present

## 2023-09-29 DIAGNOSIS — L814 Other melanin hyperpigmentation: Secondary | ICD-10-CM | POA: Diagnosis not present

## 2023-09-29 DIAGNOSIS — L821 Other seborrheic keratosis: Secondary | ICD-10-CM | POA: Diagnosis not present

## 2023-09-29 DIAGNOSIS — Z08 Encounter for follow-up examination after completed treatment for malignant neoplasm: Secondary | ICD-10-CM | POA: Diagnosis not present

## 2023-10-23 ENCOUNTER — Other Ambulatory Visit (HOSPITAL_COMMUNITY): Payer: Self-pay | Admitting: Internal Medicine

## 2023-10-23 DIAGNOSIS — Z0000 Encounter for general adult medical examination without abnormal findings: Secondary | ICD-10-CM | POA: Diagnosis not present

## 2023-10-23 DIAGNOSIS — E7800 Pure hypercholesterolemia, unspecified: Secondary | ICD-10-CM

## 2023-10-23 DIAGNOSIS — E039 Hypothyroidism, unspecified: Secondary | ICD-10-CM | POA: Diagnosis not present

## 2023-10-23 DIAGNOSIS — M109 Gout, unspecified: Secondary | ICD-10-CM | POA: Diagnosis not present

## 2023-10-23 DIAGNOSIS — M3131 Wegener's granulomatosis with renal involvement: Secondary | ICD-10-CM | POA: Diagnosis not present

## 2023-10-23 DIAGNOSIS — N1831 Chronic kidney disease, stage 3a: Secondary | ICD-10-CM | POA: Diagnosis not present

## 2023-10-23 DIAGNOSIS — R7301 Impaired fasting glucose: Secondary | ICD-10-CM | POA: Diagnosis not present

## 2023-10-23 DIAGNOSIS — Z1331 Encounter for screening for depression: Secondary | ICD-10-CM | POA: Diagnosis not present

## 2023-10-30 DIAGNOSIS — G4733 Obstructive sleep apnea (adult) (pediatric): Secondary | ICD-10-CM | POA: Diagnosis not present

## 2023-11-30 DIAGNOSIS — G4733 Obstructive sleep apnea (adult) (pediatric): Secondary | ICD-10-CM | POA: Diagnosis not present

## 2023-12-30 DIAGNOSIS — G4733 Obstructive sleep apnea (adult) (pediatric): Secondary | ICD-10-CM | POA: Diagnosis not present

## 2024-01-30 DIAGNOSIS — G4733 Obstructive sleep apnea (adult) (pediatric): Secondary | ICD-10-CM | POA: Diagnosis not present

## 2024-02-26 ENCOUNTER — Inpatient Hospital Stay (HOSPITAL_COMMUNITY)
Admission: RE | Admit: 2024-02-26 | Discharge: 2024-02-26 | Payer: Self-pay | Attending: Internal Medicine | Admitting: Internal Medicine

## 2024-02-26 DIAGNOSIS — E7800 Pure hypercholesterolemia, unspecified: Secondary | ICD-10-CM | POA: Insufficient documentation

## 2024-03-18 ENCOUNTER — Other Ambulatory Visit: Payer: Self-pay | Admitting: Internal Medicine

## 2024-03-18 DIAGNOSIS — I77810 Thoracic aortic ectasia: Secondary | ICD-10-CM

## 2024-03-28 ENCOUNTER — Ambulatory Visit
Admission: RE | Admit: 2024-03-28 | Discharge: 2024-03-28 | Disposition: A | Source: Ambulatory Visit | Attending: Internal Medicine | Admitting: Internal Medicine

## 2024-03-28 DIAGNOSIS — I77810 Thoracic aortic ectasia: Secondary | ICD-10-CM

## 2024-03-28 MED ORDER — IOPAMIDOL (ISOVUE-370) INJECTION 76%
75.0000 mL | Freq: Once | INTRAVENOUS | Status: AC | PRN
  Administered 2024-03-28: 75 mL via INTRAVENOUS
